# Patient Record
Sex: Female | Born: 1950
Health system: Southern US, Community
[De-identification: ages and names within clinical notes are randomized; demographics above are authoritative.]

## PROBLEM LIST (undated history)

## (undated) ENCOUNTER — Emergency Department: Admission: EM | Discharge status: Absent without leave | Payer: Medicare HMO

## (undated) DIAGNOSIS — G4730 Sleep apnea, unspecified: Secondary | ICD-10-CM

## (undated) DIAGNOSIS — J449 Chronic obstructive pulmonary disease, unspecified: Secondary | ICD-10-CM

## (undated) DIAGNOSIS — Z720 Tobacco use: Secondary | ICD-10-CM

## (undated) DIAGNOSIS — R519 Headache, unspecified: Secondary | ICD-10-CM

## (undated) DIAGNOSIS — I499 Cardiac arrhythmia, unspecified: Secondary | ICD-10-CM

## (undated) DIAGNOSIS — R011 Cardiac murmur, unspecified: Secondary | ICD-10-CM

## (undated) DIAGNOSIS — E785 Hyperlipidemia, unspecified: Secondary | ICD-10-CM

## (undated) DIAGNOSIS — Z800 Family history of malignant neoplasm of digestive organs: Secondary | ICD-10-CM

## (undated) DIAGNOSIS — M810 Age-related osteoporosis without current pathological fracture: Secondary | ICD-10-CM

## (undated) DIAGNOSIS — C801 Malignant (primary) neoplasm, unspecified: Secondary | ICD-10-CM

## (undated) DIAGNOSIS — J439 Emphysema, unspecified: Secondary | ICD-10-CM

## (undated) DIAGNOSIS — R51 Headache: Secondary | ICD-10-CM

## (undated) DIAGNOSIS — M1990 Unspecified osteoarthritis, unspecified site: Secondary | ICD-10-CM

## (undated) HISTORY — DX: Family history of malignant neoplasm of digestive organs: Z80.0

## (undated) HISTORY — PX: ABDOMINAL HYSTERECTOMY: SHX81

## (undated) HISTORY — PX: HEMICOLECTOMY: SHX854

## (undated) HISTORY — DX: Emphysema, unspecified: J43.9

## (undated) HISTORY — DX: Age-related osteoporosis without current pathological fracture: M81.0

## (undated) HISTORY — DX: Chronic obstructive pulmonary disease, unspecified: J44.9

## (undated) HISTORY — DX: Tobacco use: Z72.0

## (undated) HISTORY — PX: TUBAL LIGATION: SHX77

## (undated) HISTORY — DX: Sleep apnea, unspecified: G47.30

---

## 2017-02-19 ENCOUNTER — Telehealth: Payer: Self-pay | Admitting: *Deleted

## 2017-02-19 DIAGNOSIS — Z122 Encounter for screening for malignant neoplasm of respiratory organs: Secondary | ICD-10-CM

## 2017-02-19 DIAGNOSIS — Z87891 Personal history of nicotine dependence: Secondary | ICD-10-CM

## 2017-02-19 NOTE — Telephone Encounter (Signed)
Received self referral for initial lung cancer screening scan. Contacted patient and obtained smoking history,(current, 33.75 pack year) as well as answering questions related to screening process. Patient denies signs of lung cancer such as weight loss or hemoptysis. Patient denies comorbidity that would prevent curative treatment if lung cancer were found. Patient is scheduled for shared decision making visit and CT scan on 03/11/17.

## 2017-03-09 ENCOUNTER — Encounter: Payer: Self-pay | Admitting: Physician Assistant

## 2017-03-09 ENCOUNTER — Ambulatory Visit (INDEPENDENT_AMBULATORY_CARE_PROVIDER_SITE_OTHER): Payer: Medicare Other | Admitting: Physician Assistant

## 2017-03-09 VITALS — BP 132/84 | HR 68 | Temp 98.3°F | Resp 16 | Ht 70.0 in | Wt 159.0 lb

## 2017-03-09 DIAGNOSIS — Z23 Encounter for immunization: Secondary | ICD-10-CM

## 2017-03-09 DIAGNOSIS — Z131 Encounter for screening for diabetes mellitus: Secondary | ICD-10-CM

## 2017-03-09 DIAGNOSIS — Z720 Tobacco use: Secondary | ICD-10-CM

## 2017-03-09 DIAGNOSIS — Z136 Encounter for screening for cardiovascular disorders: Secondary | ICD-10-CM | POA: Diagnosis not present

## 2017-03-09 DIAGNOSIS — F329 Major depressive disorder, single episode, unspecified: Secondary | ICD-10-CM

## 2017-03-09 DIAGNOSIS — Z1211 Encounter for screening for malignant neoplasm of colon: Secondary | ICD-10-CM | POA: Diagnosis not present

## 2017-03-09 DIAGNOSIS — F1010 Alcohol abuse, uncomplicated: Secondary | ICD-10-CM | POA: Diagnosis not present

## 2017-03-09 DIAGNOSIS — Z1239 Encounter for other screening for malignant neoplasm of breast: Secondary | ICD-10-CM

## 2017-03-09 DIAGNOSIS — Z780 Asymptomatic menopausal state: Secondary | ICD-10-CM | POA: Diagnosis not present

## 2017-03-09 DIAGNOSIS — H547 Unspecified visual loss: Secondary | ICD-10-CM | POA: Diagnosis not present

## 2017-03-09 DIAGNOSIS — F32A Depression, unspecified: Secondary | ICD-10-CM

## 2017-03-09 DIAGNOSIS — Z13220 Encounter for screening for lipoid disorders: Secondary | ICD-10-CM | POA: Diagnosis not present

## 2017-03-09 DIAGNOSIS — D229 Melanocytic nevi, unspecified: Secondary | ICD-10-CM

## 2017-03-09 DIAGNOSIS — F17210 Nicotine dependence, cigarettes, uncomplicated: Secondary | ICD-10-CM

## 2017-03-09 DIAGNOSIS — K118 Other diseases of salivary glands: Secondary | ICD-10-CM | POA: Diagnosis not present

## 2017-03-09 MED ORDER — NICOTINE 14 MG/24HR TD PT24: 14.0000 mg | MEDICATED_PATCH | Freq: Every day | TRANSDERMAL | 0 refills | Status: DC

## 2017-03-09 MED ORDER — NICOTINE 7 MG/24HR TD PT24: 7.0000 mg | MEDICATED_PATCH | Freq: Every day | TRANSDERMAL | 0 refills | Status: DC

## 2017-03-09 NOTE — Progress Notes (Signed)
Patient: Jennifer [name] Female    DOB: 1950/07/15   67 y.o.   MRN: [id number] Visit Date: 03/10/2017  Today's Provider: Trinna Post, PA-C   Chief Complaint  Patient presents with  . Establish Care   Subjective:    HPI   Jennifer [name] is a 67 y/o woman presenting today to establish care. She has not seen a provider in 10 years. She used to see Dr. Humphrey Rolls at [facility]. She currently lives in [place] with roommate. She is not working.  She is single. She had one son who was killed 20 years ago in a car accident. No grandchildren.   She is single, not sexually active.   She is smoking 1 pack per day, has done so since age 60-20. She is scheduled for low dose CT lung cancer screening. Would like to try patches to quit. She declines pneumonia and tetanus vaccinations today.  She drinks 1-2 pints of whiskey per week. She says this has been going on three months, it is due to stress. Does not elaborate which stress. She does not wish to stop.   She has a family history of colon cancer in maternal aunt, uncle and grandfather. Has never been screened for colon cancer. She does not remember the last time she had a mammogram. She had a hysterectomy 23 years ago for endometriosis. She does not recall if her cervix remains. She has never had a DEXA scan.   Also requests eye referral for vision loss and dermatology referral for history of precancerous lesions and suspicious nevus.   She also reports that for the past three years she has been having intermittent bilateral swelling in her neck when she eats. She reports this is not consistent and does not persist. She reports she massages her glands and it goes away.      No Known Allergies   Current Outpatient Medications:  .  nicotine (NICODERM CQ) 14 mg/24hr patch, Place 1 patch (14 mg total) onto the skin daily., Disp: 28 patch, Rfl: 0 .  nicotine (NICODERM CQ) 7 mg/24hr patch, Place 1 patch (7 mg total) onto the skin daily., Disp: 14 patch,  Rfl: 0  Review of Systems  Constitutional: Positive for activity change and appetite change. Negative for chills, diaphoresis, fatigue, fever and unexpected weight change.  HENT: Positive for congestion, rhinorrhea, sinus pressure and sneezing. Negative for dental problem, drooling, ear discharge, ear pain, facial swelling, hearing loss, mouth sores, nosebleeds, postnasal drip, sinus pain, sore throat, tinnitus, trouble swallowing and voice change.   Eyes: Positive for discharge. Negative for photophobia, pain, redness, itching and visual disturbance.  Respiratory: Positive for cough. Negative for apnea, choking, chest tightness, shortness of breath, wheezing and stridor.   Cardiovascular: Negative.   Gastrointestinal: Negative.   Endocrine: Negative.   Genitourinary: Negative.   Musculoskeletal: Positive for back pain and myalgias. Negative for arthralgias, gait problem, joint swelling, neck pain and neck stiffness.  Skin: Negative.   Allergic/Immunologic: Positive for environmental allergies. Negative for food allergies and immunocompromised state.  Neurological: Positive for headaches. Negative for dizziness, tremors, seizures, syncope, facial asymmetry, speech difficulty, weakness, light-headedness and numbness.  Hematological: Negative.   Psychiatric/Behavioral: Positive for behavioral problems. Negative for agitation, confusion, decreased concentration, dysphoric mood, hallucinations, self-injury, sleep disturbance and suicidal ideas. The patient is not nervous/anxious and is not hyperactive.     Social History   Tobacco Use  . Smoking status: Current Every Day Smoker    Packs/day: 0.50  Years: 46.00    Pack years: 23.00    Types: Cigarettes  . Smokeless tobacco: Never Used  Substance Use Topics  . Alcohol use: Yes    Alcohol/week: 7.2 oz    Types: 12 Shots of liquor per week   Family History  Problem Relation Age of Onset  . Diabetes Mother   . Kidney disease Mother   .  Heart attack Father   . Diabetes Maternal Grandmother   . Colon cancer Maternal Grandfather   . Heart disease Paternal Grandmother   . Colon cancer Paternal Uncle   . Colon cancer Paternal Aunt    Past Surgical History:  Procedure Laterality Date  . HYSTEROTOMY  1996     Objective:   BP 132/84 (BP Location: Right Arm, Patient Position: Sitting, Cuff Size: Normal)   Pulse 68   Temp 98.3 F (36.8 C) (Oral)   Resp 16   Ht 5\' 10"  (1.778 m)   Wt 159 lb (72.1 kg)   BMI 22.81 kg/m  Vitals:   03/09/17 1422  BP: 132/84  Pulse: 68  Resp: 16  Temp: 98.3 F (36.8 C)  TempSrc: Oral  Weight: 159 lb (72.1 kg)  Height: 5\' 10"  (1.778 m)     Physical Exam  Constitutional: She is oriented to person, place, and time. She appears well-developed and well-nourished.  HENT:  Right Ear: External ear normal.  Left Ear: External ear normal.  Mouth/Throat: Oropharynx is clear and moist. No oropharyngeal exudate.  Eyes: Conjunctivae are normal.  Neck: Neck supple.  Cardiovascular: Normal rate and regular rhythm.  Pulmonary/Chest: Effort normal. She has wheezes.  Abdominal: Soft. Bowel sounds are normal.  Lymphadenopathy:    She has no cervical adenopathy.  Neurological: She is alert and oriented to person, place, and time.  Skin: Skin is warm and dry.  Psychiatric: She has a normal mood and affect. Her behavior is normal.        Assessment & Plan:     1. Vision loss  - Ambulatory referral to Ophthalmology  2. Suspicious nevus  - Ambulatory referral to Dermatology  3. Other diseases of salivary glands  - Ambulatory referral to ENT  4. Tobacco abuse  Counseled 3-10 minutes. Scheduled for lung cancer screening.  - nicotine (NICODERM CQ) 14 mg/24hr patch; Place 1 patch (14 mg total) onto the skin daily.  Dispense: 28 patch; Refill: 0 - nicotine (NICODERM CQ) 7 mg/24hr patch; Place 1 patch (7 mg total) onto the skin daily.  Dispense: 14 patch; Refill: 0  5. Alcohol  abuse  Counseled that 1-2 pints of whiskey constitutes alcohol abuse. She does not want to stop or seek therapy.  6. Colon cancer screening  - Ambulatory referral to Gastroenterology  7. Breast cancer screening  - MM Digital Screening; Future  8. Need for vaccination against Streptococcus pneumoniae  Declined.  9. Post-menopausal  - DG DXA FRACTURE ASSESSMENT; Future  10. Diabetes mellitus screening  - Comprehensive Metabolic Panel (CMET)  11. Screening cholesterol level  - Lipid Profile  12. Depression, unspecified depression type  Does not desire treatment or therapy.  - TSH  Return in about 2 weeks (around 03/23/2017) for PAP/breast exam.  The entirety of the information documented in the History of Present Illness, Review of Systems and Physical Exam were personally obtained by me. Portions of this information were initially documented by Ashley Royalty, CMA and reviewed by me for thoroughness and accuracy.    I have spent 45 minutes with this patient, >  50% of which was spent on counseling and coordination of care.       Trinna Post, PA-C  [facility]

## 2017-03-09 NOTE — Patient Instructions (Signed)

## 2017-03-10 ENCOUNTER — Telehealth: Payer: Self-pay

## 2017-03-10 ENCOUNTER — Other Ambulatory Visit: Payer: Self-pay

## 2017-03-10 ENCOUNTER — Encounter: Payer: Self-pay | Admitting: Oncology

## 2017-03-10 ENCOUNTER — Encounter: Payer: Self-pay | Admitting: Physician Assistant

## 2017-03-10 DIAGNOSIS — F17200 Nicotine dependence, unspecified, uncomplicated: Secondary | ICD-10-CM | POA: Insufficient documentation

## 2017-03-10 DIAGNOSIS — Z1211 Encounter for screening for malignant neoplasm of colon: Secondary | ICD-10-CM

## 2017-03-10 DIAGNOSIS — Z720 Tobacco use: Secondary | ICD-10-CM | POA: Insufficient documentation

## 2017-03-10 LAB — COMPREHENSIVE METABOLIC PANEL
ALT: 18 IU/L (ref 0–32)
AST: 22 IU/L (ref 0–40)
Albumin/Globulin Ratio: 1.9 (ref 1.2–2.2)
Albumin: 4.7 g/dL (ref 3.6–4.8)
Alkaline Phosphatase: 97 IU/L (ref 39–117)
BUN/Creatinine Ratio: 9 — ABNORMAL LOW (ref 12–28)
BUN: 8 mg/dL (ref 8–27)
Bilirubin Total: 0.4 mg/dL (ref 0.0–1.2)
CO2: 21 mmol/L (ref 20–29)
Calcium: 9.8 mg/dL (ref 8.7–10.3)
Chloride: 102 mmol/L (ref 96–106)
Creatinine, Ser: 0.85 mg/dL (ref 0.57–1.00)
GFR calc Af Amer: 83 mL/min/{1.73_m2} (ref >59)
GFR calc non Af Amer: 72 mL/min/{1.73_m2} (ref >59)
Globulin, Total: 2.5 g/dL (ref 1.5–4.5)
Glucose: 78 mg/dL (ref 65–99)
Potassium: 4.3 mmol/L (ref 3.5–5.2)
Sodium: 140 mmol/L (ref 134–144)
Total Protein: 7.2 g/dL (ref 6.0–8.5)

## 2017-03-10 LAB — LIPID PANEL
Chol/HDL Ratio: 3.6 ratio (ref 0.0–4.4)
Cholesterol, Total: 298 mg/dL — ABNORMAL HIGH (ref 100–199)
HDL: 82 mg/dL (ref >39)
LDL Calculated: 184 mg/dL — ABNORMAL HIGH (ref 0–99)
Triglycerides: 158 mg/dL — ABNORMAL HIGH (ref 0–149)
VLDL Cholesterol Cal: 32 mg/dL (ref 5–40)

## 2017-03-10 LAB — TSH: TSH: 2.28 u[IU]/mL (ref 0.450–4.500)

## 2017-03-10 NOTE — Telephone Encounter (Signed)
Gastroenterology Pre-Procedure Review  Request Date: 03/29/17 Requesting Physician: Dr. Marius Ditch  PATIENT REVIEW QUESTIONS: The patient responded to the following health history questions as indicated:    1. Are you having any GI issues? no 2. Do you have a personal history of Polyps? no 3. Do you have a family history of Colon Cancer or Polyps? yes (colon cancer grandma and aunt) 4. Diabetes Mellitus? no 5. Joint replacements in the past 12 months?no 6. Major health problems in the past 3 months?no 7. Any artificial heart valves, MVP, or defibrillator?no    MEDICATIONS & ALLERGIES:    Patient reports the following regarding taking any anticoagulation/antiplatelet therapy:   Plavix, Coumadin, Eliquis, Xarelto, Lovenox, Pradaxa, Brilinta, or Effient? no Aspirin? no  Patient confirms/reports the following medications:  Current Outpatient Medications  Medication Sig Dispense Refill  . nicotine (NICODERM CQ) 14 mg/24hr patch Place 1 patch (14 mg total) onto the skin daily. 28 patch 0  . nicotine (NICODERM CQ) 7 mg/24hr patch Place 1 patch (7 mg total) onto the skin daily. 14 patch 0   No current facility-administered medications for this visit.     Patient confirms/reports the following allergies:  No Known Allergies  No orders of the defined types were placed in this encounter.   AUTHORIZATION INFORMATION Primary Insurance: 1D#: Group #:  Secondary Insurance: 1D#: Group #:  SCHEDULE INFORMATION: Date: 03/29/17 Time: Location:[facility]

## 2017-03-11 ENCOUNTER — Ambulatory Visit
Admission: RE | Admit: 2017-03-11 | Discharge: 2017-03-11 | Disposition: A | Discharge status: Home / Self Care | Payer: Medicare Other | Source: Ambulatory Visit | Attending: Oncology | Admitting: Oncology

## 2017-03-11 ENCOUNTER — Encounter: Payer: Self-pay | Admitting: Physician Assistant

## 2017-03-11 ENCOUNTER — Inpatient Hospital Stay: Payer: Medicaid Other | Attending: Oncology | Admitting: Oncology

## 2017-03-11 DIAGNOSIS — Z87891 Personal history of nicotine dependence: Secondary | ICD-10-CM

## 2017-03-11 DIAGNOSIS — I700 Atherosclerosis of aorta: Secondary | ICD-10-CM | POA: Diagnosis not present

## 2017-03-11 DIAGNOSIS — J432 Centrilobular emphysema: Secondary | ICD-10-CM | POA: Insufficient documentation

## 2017-03-11 DIAGNOSIS — Z122 Encounter for screening for malignant neoplasm of respiratory organs: Secondary | ICD-10-CM | POA: Diagnosis not present

## 2017-03-11 DIAGNOSIS — F17210 Nicotine dependence, cigarettes, uncomplicated: Secondary | ICD-10-CM | POA: Diagnosis not present

## 2017-03-11 NOTE — Progress Notes (Signed)
In accordance with CMS guidelines, patient has met eligibility criteria including age, absence of signs or symptoms of lung cancer.  Social History   Tobacco Use  . Smoking status: Current Every Day Smoker    Packs/day: 0.75    Years: 45.00    Pack years: 33.75    Types: Cigarettes  . Smokeless tobacco: Never Used  Substance Use Topics  . Alcohol use: Yes    Alcohol/week: 7.2 oz    Types: 12 Shots of liquor per week  . Drug use: No     A shared decision-making session was conducted prior to the performance of CT scan. This includes one or more decision aids, includes benefits and harms of screening, follow-up diagnostic testing, over-diagnosis, false positive rate, and total radiation exposure.  Counseling on the importance of adherence to annual lung cancer LDCT screening, impact of co-morbidities, and ability or willingness to undergo diagnosis and treatment is imperative for compliance of the program.  Counseling on the importance of continued smoking cessation for former smokers; the importance of smoking cessation for current smokers, and information about tobacco cessation interventions have been given to patient including [place] and 1800 quit [place] programs.  Written order for lung cancer screening with LDCT has been given to the patient and any and all questions have been answered to the best of my abilities.   Yearly follow up will be coordinated by Burgess Estelle, Thoracic Navigator.  Faythe Casa, NP 03/11/2017 3:23 PM

## 2017-03-12 ENCOUNTER — Encounter: Payer: Self-pay | Admitting: *Deleted

## 2017-03-15 DIAGNOSIS — C44712 Basal cell carcinoma of skin of right lower limb, including hip: Secondary | ICD-10-CM | POA: Diagnosis not present

## 2017-03-15 DIAGNOSIS — L570 Actinic keratosis: Secondary | ICD-10-CM | POA: Diagnosis not present

## 2017-03-16 ENCOUNTER — Other Ambulatory Visit: Payer: Self-pay | Admitting: Physician Assistant

## 2017-03-16 DIAGNOSIS — Z780 Asymptomatic menopausal state: Secondary | ICD-10-CM

## 2017-03-16 DIAGNOSIS — Z13820 Encounter for screening for osteoporosis: Secondary | ICD-10-CM

## 2017-03-25 ENCOUNTER — Encounter: Payer: Self-pay | Admitting: Physician Assistant

## 2017-03-25 ENCOUNTER — Ambulatory Visit (INDEPENDENT_AMBULATORY_CARE_PROVIDER_SITE_OTHER): Payer: Medicare Other | Admitting: Physician Assistant

## 2017-03-25 VITALS — BP 136/74 | HR 64 | Temp 98.4°F | Resp 16 | Wt 157.0 lb

## 2017-03-25 DIAGNOSIS — Z23 Encounter for immunization: Secondary | ICD-10-CM

## 2017-03-25 DIAGNOSIS — Z720 Tobacco use: Secondary | ICD-10-CM | POA: Diagnosis not present

## 2017-03-25 DIAGNOSIS — E7800 Pure hypercholesterolemia, unspecified: Secondary | ICD-10-CM | POA: Diagnosis not present

## 2017-03-25 DIAGNOSIS — Z124 Encounter for screening for malignant neoplasm of cervix: Secondary | ICD-10-CM | POA: Diagnosis not present

## 2017-03-25 DIAGNOSIS — J439 Emphysema, unspecified: Secondary | ICD-10-CM | POA: Diagnosis not present

## 2017-03-25 DIAGNOSIS — Z1231 Encounter for screening mammogram for malignant neoplasm of breast: Secondary | ICD-10-CM | POA: Diagnosis not present

## 2017-03-25 DIAGNOSIS — Z1239 Encounter for other screening for malignant neoplasm of breast: Secondary | ICD-10-CM

## 2017-03-25 DIAGNOSIS — F17210 Nicotine dependence, cigarettes, uncomplicated: Secondary | ICD-10-CM | POA: Diagnosis not present

## 2017-03-25 DIAGNOSIS — I700 Atherosclerosis of aorta: Secondary | ICD-10-CM

## 2017-03-25 MED ORDER — ATORVASTATIN CALCIUM 10 MG PO TABS: 10.0000 mg | ORAL_TABLET | Freq: Every day | ORAL | 0 refills | Status: DC

## 2017-03-25 NOTE — Patient Instructions (Signed)

## 2017-03-25 NOTE — Progress Notes (Signed)
Patient: Jennifer [name] Female    DOB: [birth year]   67 y.o.   MRN: [id number] Visit Date: 03/26/2017  Today's Provider: Trinna Post, PA-C   Chief Complaint  Patient presents with  . Gynecologic Exam   Subjective:    HPI Jennifer [name] is a 67 y/o woman presenting today for maintenance care. She has declined an AWV. She has a DEXA scan scheduled as well as an upcoming colonoscopy. She has a mammogram ordered but has not scheduled this.   She had a hysterectomy remotely for endometriosis but is not sure whether or not she has a cervix.   She is currently smoking, has not started using nicotine patches.   Her cholesterol was elevated and CVD risk >7.5% indicating statin therapy. She would like to try this.   She had a low dose CT scan which showed some benign appearing pulmonary nodules, emphysematous changes, aortic atherosclerosis.      No Known Allergies   Current Outpatient Medications:  .  atorvastatin (LIPITOR) 10 MG tablet, Take 1 tablet (10 mg total) by mouth daily., Disp: 90 tablet, Rfl: 0 .  nicotine (NICODERM CQ) 14 mg/24hr patch, Place 1 patch (14 mg total) onto the skin daily., Disp: 28 patch, Rfl: 0 .  nicotine (NICODERM CQ) 7 mg/24hr patch, Place 1 patch (7 mg total) onto the skin daily., Disp: 14 patch, Rfl: 0  Review of Systems  12 point ROS reviewed and was negative except for listed in HPI.  Social History   Tobacco Use  . Smoking status: Current Every Day Smoker    Packs/day: 0.75    Years: 45.00    Pack years: 33.75    Types: Cigarettes  . Smokeless tobacco: Never Used  Substance Use Topics  . Alcohol use: Yes    Alcohol/week: 7.2 oz    Types: 12 Shots of liquor per week   Objective:   BP 136/74 (BP Location: Left Arm, Patient Position: Sitting, Cuff Size: Normal)   Pulse 64   Temp 98.4 F (36.9 C) (Oral)   Resp 16   Wt 157 lb (71.2 kg)   BMI 22.53 kg/m  Vitals:   03/25/17 1413  BP: 136/74  Pulse: 64  Resp: 16  Temp: 98.4 F (36.9  C)  TempSrc: Oral  Weight: 157 lb (71.2 kg)     Physical Exam      Assessment & Plan:     1. Pulmonary emphysema, unspecified emphysema type ([place])  Changes evident on CT scan. Patient long time smoker, continues to smoke. Has been encouraged to quit smoking. She has not yet used nicotine patches.  2. Need for pneumococcal vaccination  - Pneumococcal conjugate vaccine 13-valent  3. Aortic atherosclerosis ([facility])  Evident on low dose CT scan and cholesterol elevated. Starting Lipitor. Has also been advised smoking adversely affects atherosclerosis.   4. Hypercholesterolemia  - atorvastatin (LIPITOR) 10 MG tablet; Take 1 tablet (10 mg total) by mouth daily.  Dispense: 90 tablet; Refill: 0  5. Breast cancer screening  Needs to schedule.   6. Cervical cancer screening  She does NOT have cervix on exam today, had hysterectomy for endometriosis and had no cancerous findings. She does not need a vaginal smear today.  7. Tobacco abuse  Counseled >48min on tobacco cessation.  Return in about 6 weeks (around 05/06/2017) for Hyperlipidemia, Lipitor.  The entirety of the information documented in the History of Present Illness, Review of Systems and Physical Exam were personally obtained  by me. Portions of this information were initially documented by Ashley Royalty, CMA and reviewed by me for thoroughness and accuracy.          Trinna Post, PA-C  [facility]

## 2017-03-26 ENCOUNTER — Encounter: Payer: Self-pay | Admitting: Emergency Medicine

## 2017-03-29 ENCOUNTER — Ambulatory Visit: Payer: Medicare Other | Admitting: Certified Registered"

## 2017-03-29 ENCOUNTER — Encounter: Payer: Self-pay | Admitting: Anesthesiology

## 2017-03-29 ENCOUNTER — Encounter
Admission: RE | Disposition: A | Discharge status: Home / Self Care | Payer: Self-pay | Source: Ambulatory Visit | Attending: Gastroenterology

## 2017-03-29 ENCOUNTER — Ambulatory Visit
Admission: RE | Admit: 2017-03-29 | Discharge: 2017-03-29 | Disposition: A | Discharge status: Home / Self Care | Payer: Medicare Other | Source: Ambulatory Visit | Attending: Gastroenterology | Admitting: Gastroenterology

## 2017-03-29 DIAGNOSIS — Z800 Family history of malignant neoplasm of digestive organs: Secondary | ICD-10-CM | POA: Diagnosis not present

## 2017-03-29 DIAGNOSIS — K635 Polyp of colon: Secondary | ICD-10-CM | POA: Diagnosis not present

## 2017-03-29 DIAGNOSIS — F17210 Nicotine dependence, cigarettes, uncomplicated: Secondary | ICD-10-CM | POA: Diagnosis not present

## 2017-03-29 DIAGNOSIS — D120 Benign neoplasm of cecum: Secondary | ICD-10-CM | POA: Diagnosis not present

## 2017-03-29 DIAGNOSIS — D124 Benign neoplasm of descending colon: Secondary | ICD-10-CM | POA: Insufficient documentation

## 2017-03-29 DIAGNOSIS — D122 Benign neoplasm of ascending colon: Secondary | ICD-10-CM | POA: Diagnosis not present

## 2017-03-29 DIAGNOSIS — Z1211 Encounter for screening for malignant neoplasm of colon: Secondary | ICD-10-CM

## 2017-03-29 DIAGNOSIS — Z79899 Other long term (current) drug therapy: Secondary | ICD-10-CM | POA: Insufficient documentation

## 2017-03-29 DIAGNOSIS — D123 Benign neoplasm of transverse colon: Secondary | ICD-10-CM | POA: Insufficient documentation

## 2017-03-29 DIAGNOSIS — D125 Benign neoplasm of sigmoid colon: Secondary | ICD-10-CM | POA: Diagnosis not present

## 2017-03-29 HISTORY — PX: COLONOSCOPY WITH PROPOFOL: SHX5780

## 2017-03-29 SURGERY — COLONOSCOPY WITH PROPOFOL
Anesthesia: General

## 2017-03-29 MED ORDER — SPOT INK MARKER SYRINGE KIT
PACK | SUBMUCOSAL | Status: DC | PRN
  Administered 2017-03-29: 1.5 mL via SUBMUCOSAL

## 2017-03-29 MED ORDER — PROPOFOL 500 MG/50ML IV EMUL
INTRAVENOUS | Status: AC
  Filled 2017-03-29: qty 50

## 2017-03-29 MED ORDER — PROPOFOL 10 MG/ML IV BOLUS
INTRAVENOUS | Status: DC | PRN
  Administered 2017-03-29: 30 mg via INTRAVENOUS
  Administered 2017-03-29: 20 mg via INTRAVENOUS
  Administered 2017-03-29: 50 mg via INTRAVENOUS

## 2017-03-29 MED ORDER — PROPOFOL 500 MG/50ML IV EMUL
INTRAVENOUS | Status: DC | PRN
  Administered 2017-03-29: 140 ug/kg/min via INTRAVENOUS

## 2017-03-29 MED ORDER — PHENYLEPHRINE HCL 10 MG/ML IJ SOLN
INTRAMUSCULAR | Status: AC
  Filled 2017-03-29: qty 1

## 2017-03-29 MED ORDER — LIDOCAINE HCL (CARDIAC) 20 MG/ML IV SOLN
INTRAVENOUS | Status: DC | PRN
  Administered 2017-03-29: 50 mg via INTRAVENOUS

## 2017-03-29 MED ORDER — EPINEPHRINE PF 1 MG/10ML IJ SOSY
PREFILLED_SYRINGE | INTRAMUSCULAR | Status: DC | PRN
  Administered 2017-03-29: 1 mg via SUBCUTANEOUS

## 2017-03-29 MED ORDER — SODIUM CHLORIDE 0.9 % IV SOLN
INTRAVENOUS | Status: DC
  Administered 2017-03-29: 1000 mL via INTRAVENOUS

## 2017-03-29 MED ORDER — LIDOCAINE HCL (PF) 2 % IJ SOLN
INTRAMUSCULAR | Status: AC
  Filled 2017-03-29: qty 10

## 2017-03-29 NOTE — Op Note (Signed)
[facility] Gastroenterology Patient Name: Jennifer [name] Procedure Date: 03/29/2017 9:12 AM MRN: [id number] Account #: [id number] Date of Birth: Feb 29, 1950 Admit Type: Outpatient Age: 67 Room: [facility] ENDO ROOM 2 Gender: Female Note Status: Finalized Procedure:            Colonoscopy Indications:          Colon cancer screening in patient at increased risk:                        Family history of colorectal cancer in multiple 2nd                        degree relatives, This is the patient's first                        colonoscopy Providers:            Lin Landsman MD, MD Medicines:            Monitored Anesthesia Care Complications:        No immediate complications. Estimated blood loss:                        Minimal. Procedure:            Pre-Anesthesia Assessment:                       - Prior to the procedure, a History and Physical was                        performed, and patient medications and allergies were                        reviewed. The patient is competent. The risks and                        benefits of the procedure and the sedation options and                        risks were discussed with the patient. All questions                        were answered and informed consent was obtained.                        Patient identification and proposed procedure were                        verified by the physician, the nurse, the                        anesthesiologist, the anesthetist and the technician in                        the pre-procedure area in the procedure room in the                        endoscopy suite. Mental Status Examination: alert and                        oriented. Airway  Examination: normal oropharyngeal                        airway and neck mobility. Respiratory Examination:                        clear to auscultation. CV Examination: normal.                        Prophylactic Antibiotics: The patient does not  require                        prophylactic antibiotics. Prior Anticoagulants: The                        patient has taken no previous anticoagulant or                        antiplatelet agents. ASA Grade Assessment: II - A                        patient with mild systemic disease. After reviewing the                        risks and benefits, the patient was deemed in                        satisfactory condition to undergo the procedure. The                        anesthesia plan was to use monitored anesthesia care                        (MAC). Immediately prior to administration of                        medications, the patient was re-assessed for adequacy                        to receive sedatives. The heart rate, respiratory rate,                        oxygen saturations, blood pressure, adequacy of                        pulmonary ventilation, and response to care were                        monitored throughout the procedure. The physical status                        of the patient was re-assessed after the procedure.                       After obtaining informed consent, the colonoscope was                        passed under direct vision. Throughout the procedure,                        the patient's blood pressure,  pulse, and oxygen                        saturations were monitored continuously. The                        Colonoscope was introduced through the anus and                        advanced to the the cecum, identified by appendiceal                        orifice and ileocecal valve. The colonoscopy was                        performed without difficulty. The patient tolerated the                        procedure fairly well. The quality of the bowel                        preparation was evaluated using the BBPS [facility] Bowel                        Preparation Scale) with scores of: Right Colon = 3,                        Transverse Colon = 3 and Left Colon =  3 (entire mucosa                        seen well with no residual staining, small fragments of                        stool or opaque liquid). The total BBPS score equals 9. Findings:      The perianal and digital rectal examinations were normal. Pertinent       negatives include normal sphincter tone and no palpable rectal lesions.      A 7 mm polyp was found in the cecum. The polyp was sessile. The polyp       was removed with a hot snare. Resection and retrieval were complete.      A 10 mm polyp was found in the cecum. The polyp was carpet-like and       flat. Preparations were made for mucosal resection. Methylene blue was       injected to raise the lesion. Snare mucosal resection was performed.       Resection and retrieval were complete. To prevent bleeding after mucosal       resection, one hemostatic clip was successfully placed. There was no       bleeding at the end of the procedure.      A 10 mm polyp was found in the proximal ascending colon. The polyp was       carpet-like and flat. Preparations were made for mucosal resection.       Methylene blue was injected to raise the lesion. Snare mucosal resection       was performed. Resection was complete, and retrieval was complete.      Two sessile polyps were found in the ascending colon. The polyps were 5  mm in size. These polyps were removed with a cold snare. Resection and       retrieval were complete.      A 12 mm polyp was found in the mid ascending colon. The polyp was       carpet-like and flat. Preparations were made for mucosal resection.       Methylene blue was injected to raise the lesion. Snare mucosal resection       was performed. Resection was incomplete. The resected tissue was       retrieved. To prevent bleeding after mucosal resection, one hemostatic       clip was successfully placed. There was no bleeding at the end of the       procedure.      A 25 mm polyp was found in the ascending colon. The  polyp was       carpet-like and flat. Polypectomy was not attempted due to polyp size       (too large to be excised). Area was tattooed with an injection of [place]       ink.      A 8 mm polyp was found in the transverse colon. The polyp was sessile.       The polyp was removed with a hot snare. Resection and retrieval were       complete.      A 10 mm polyp was found in the descending colon. The polyp was flat.       Preparations were made for mucosal resection. Methylene blue was       injected to raise the lesion. Snare mucosal resection was performed.       Resection and retrieval were complete. To prevent bleeding after mucosal       resection, one hemostatic clip was successfully placed. There was no       bleeding during, or at the end, of the procedure.      A 8 mm polyp was found in the sigmoid colon. The polyp was sessile. The       polyp was removed with a hot snare. Resection and retrieval were       complete.      The retroflexed view of the distal rectum and anal verge was normal and       showed no anal or rectal abnormalities. Impression:           - One 7 mm polyp in the cecum, removed with a hot                        snare. Resected and retrieved.                       - One 10 mm polyp in the cecum, removed with mucosal                        resection. Resected and retrieved. Clip was placed.                       - One 10 mm polyp in the proximal ascending colon,                        removed with mucosal resection. Resected and retrieved.                       -  Two 5 mm polyps in the ascending colon, removed with                        a cold snare. Resected and retrieved.                       - One 12 mm polyp in the mid ascending colon, removed                        with mucosal resection. Incomplete resection. Resected                        tissue retrieved. Clip was placed.                       - One 25 mm polyp in the ascending colon. Resection not                         attempted. Tattooed.                       - One 8 mm polyp in the transverse colon, removed with                        a hot snare. Resected and retrieved.                       - One 10 mm polyp in the descending colon, removed with                        mucosal resection. Resected and retrieved. Clip was                        placed.                       - One 8 mm polyp in the sigmoid colon, removed with a                        hot snare. Resected and retrieved.                       - The distal rectum and anal verge are normal on                        retroflexion view.                       - Mucosal resection was performed. Resection and                        retrieval were complete.                       - Mucosal resection was performed. Resection was                        complete, and retrieval was complete.                       - Mucosal resection was performed. Resection was  incomplete. The resected tissue was retrieved.                       - Mucosal resection was performed. Resection and                        retrieval were complete. Recommendation:       - Discharge patient to home.                       - Resume previous diet today.                       - Continue present medications.                       - Await pathology results.                       - Repeat colonoscopy at appointment to be scheduled for                        retreatment.                       - Return to my office in 1 week. Procedure Code(s):    --- Professional ---                       [id number], 78, Colonoscopy, flexible; with endoscopic                        mucosal resection                       [phone], Colonoscopy, flexible; with removal of tumor(s),                        polyp(s), or other lesion(s) by snare technique                       [id number], 85, Colonoscopy, flexible; with directed                        submucosal injection(s),  any substance Diagnosis Code(s):    --- Professional ---                       Z80.0, Family history of malignant neoplasm of                        digestive organs                       D12.0, Benign neoplasm of cecum                       D12.2, Benign neoplasm of ascending colon                       D12.3, Benign neoplasm of transverse colon (hepatic                        flexure or splenic flexure)  D12.4, Benign neoplasm of descending colon                       D12.5, Benign neoplasm of sigmoid colon CPT copyright 2016 American Medical Association. All rights reserved. The codes documented in this report are preliminary and upon coder review may  be revised to meet current compliance requirements. Dr. Ulyess Mort Lin Landsman MD, MD 03/29/2017 10:51:16 AM This report has been signed electronically. Number of Addenda: 0 Note Initiated On: 03/29/2017 9:12 AM Scope Withdrawal Time: 1 hour 2 minutes 54 seconds  Total Procedure Duration: 1 hour 8 minutes 59 seconds       [facility]

## 2017-03-29 NOTE — Anesthesia Procedure Notes (Signed)
Performed by: Law Corsino, CRNA Pre-anesthesia Checklist: Patient identified, Emergency Drugs available, Suction available, Patient being monitored and Timeout performed Patient Re-evaluated:Patient Re-evaluated prior to induction Oxygen Delivery Method: Nasal cannula Induction Type: IV induction       

## 2017-03-29 NOTE — Anesthesia Postprocedure Evaluation (Signed)
Anesthesia Post Note  Patient: Jennifer [name]  Procedure(s) Performed: COLONOSCOPY WITH PROPOFOL (N/A )  Patient location during evaluation: Endoscopy Anesthesia Type: General Level of consciousness: awake and alert Pain management: pain level controlled Vital Signs Assessment: post-procedure vital signs reviewed and stable Respiratory status: spontaneous breathing, nonlabored ventilation, respiratory function stable and patient connected to nasal cannula oxygen Cardiovascular status: blood pressure returned to baseline and stable Postop Assessment: no apparent nausea or vomiting Anesthetic complications: no     Last Vitals:  Vitals:   03/29/17 1057 03/29/17 1107  BP: (!) 146/76 140/81  Pulse: 61 64  Resp: 20 18  Temp:    SpO2: 99% 100%    Last Pain:  Vitals:   03/29/17 1037  TempSrc: Axillary  PainSc:                  [name]

## 2017-03-29 NOTE — Transfer of Care (Signed)
Immediate Anesthesia Transfer of Care Note  Patient: Jennifer [name]  Procedure(s) Performed: COLONOSCOPY WITH PROPOFOL (N/A )  Patient Location: PACU  Anesthesia Type:General  Level of Consciousness: sedated  Airway & Oxygen Therapy: Patient Spontanous Breathing and Patient connected to nasal cannula oxygen  Post-op Assessment: Report given to RN and Post -op Vital signs reviewed and stable  Post vital signs: Reviewed and stable  Last Vitals:  Vitals:   03/29/17 0755 03/29/17 1038  BP: 134/73 (!) 110/55  Pulse: 65 60  Resp: 16 18  Temp: (!) 35.9 C   SpO2: 100% 98%    Last Pain:  Vitals:   03/29/17 0755  TempSrc: Tympanic  PainSc: 2          Complications: No apparent anesthesia complications

## 2017-03-29 NOTE — Anesthesia Preprocedure Evaluation (Signed)
Anesthesia Evaluation  Patient identified by MRN, date of birth, ID band Patient awake    Reviewed: Allergy & Precautions, H&P , NPO status , Patient's Chart, lab work & pertinent test results, reviewed documented beta blocker date and time   History of Anesthesia Complications Negative for: history of anesthetic complications  Airway Mallampati: I  TM Distance: >3 FB Neck ROM: full    Dental  (+) Partial Upper, Dental Advidsory Given, Missing, Poor Dentition   Pulmonary neg shortness of breath, neg sleep apnea, COPD, neg recent URI, Current Smoker,           Cardiovascular Exercise Tolerance: Good (-) hypertension(-) angina(-) CAD, (-) Past MI, (-) Cardiac Stents and (-) CABG + dysrhythmias (palpitations) (-) Valvular Problems/Murmurs     Neuro/Psych negative neurological ROS  negative psych ROS   GI/Hepatic negative GI ROS, Neg liver ROS,   Endo/Other  negative endocrine ROS  Renal/GU negative Renal ROS  negative genitourinary   Musculoskeletal   Abdominal   Peds  Hematology negative hematology ROS (+)   Anesthesia Other Findings History reviewed. No pertinent past medical history.   Reproductive/Obstetrics negative OB ROS                             Anesthesia Physical Anesthesia Plan  ASA: II  Anesthesia Plan: General   Post-op Pain Management:    Induction: Intravenous  PONV Risk Score and Plan: 2 and Propofol infusion  Airway Management Planned: Nasal Cannula  Additional Equipment:   Intra-op Plan:   Post-operative Plan:   Informed Consent: I have reviewed the patients History and Physical, chart, labs and discussed the procedure including the risks, benefits and alternatives for the proposed anesthesia with the patient or authorized representative who has indicated his/her understanding and acceptance.   Dental Advisory Given  Plan Discussed with: Anesthesiologist,  CRNA and Surgeon  Anesthesia Plan Comments:         Anesthesia Quick Evaluation

## 2017-03-29 NOTE — Anesthesia Post-op Follow-up Note (Signed)
Anesthesia QCDR form completed.        

## 2017-03-29 NOTE — H&P (Signed)
Cephas Darby, MD [address]  [place]  [place], [place] [address]  Main: [phone]  Fax: [phone] Pager: [phone]  Primary Care Physician:  Trinna Post, PA-C Primary Gastroenterologist:  Dr. Cephas Darby  Pre-Procedure History & Physical: HPI:  Jennifer [name] is a 67 y.o. female is here for an colonoscopy.   History reviewed. No pertinent past medical history.  Past Surgical History:  Procedure Laterality Date  . HYSTEROTOMY  1996    Prior to Admission medications   Medication Sig Start Date End Date Taking? Authorizing Provider  atorvastatin (LIPITOR) 10 MG tablet Take 1 tablet (10 mg total) by mouth daily. 03/25/17  Yes Trinna Post, PA-C  nicotine (NICODERM CQ) 14 mg/24hr patch Place 1 patch (14 mg total) onto the skin daily. 03/09/17  Yes Trinna Post, PA-C  nicotine (NICODERM CQ) 7 mg/24hr patch Place 1 patch (7 mg total) onto the skin daily. 03/09/17  Yes Trinna Post, PA-C    Allergies as of 03/11/2017  . (No Known Allergies)    Family History  Problem Relation Age of Onset  . Diabetes Mother   . Kidney disease Mother   . Heart attack Father   . Diabetes Maternal Grandmother   . Colon cancer Maternal Grandfather   . Heart disease Paternal Grandmother   . Colon cancer Paternal Uncle   . Colon cancer Paternal Aunt     Social History   Socioeconomic History  . Marital status: Single    Spouse name: Not on file  . Number of children: Not on file  . Years of education: Not on file  . Highest education level: Not on file  Social Needs  . Financial resource strain: Not on file  . Food insecurity - worry: Not on file  . Food insecurity - inability: Not on file  . Transportation needs - medical: Not on file  . Transportation needs - non-medical: Not on file  Occupational History  . Not on file  Tobacco Use  . Smoking status: Current Every Day Smoker    Packs/day: 0.75    Years: 45.00    Pack years: 33.75    Types:  Cigarettes  . Smokeless tobacco: Never Used  Substance and Sexual Activity  . Alcohol use: Yes    Alcohol/week: 7.2 oz    Types: 12 Shots of liquor per week  . Drug use: No  . Sexual activity: Not on file  Other Topics Concern  . Not on file  Social History Narrative  . Not on file    Review of Systems: See HPI, otherwise negative ROS  Physical Exam: BP 134/73   Pulse 65   Temp (!) 96.7 F (35.9 C) (Tympanic)   Resp 16   Ht 5\' 10"  (1.778 m)   Wt 157 lb (71.2 kg)   SpO2 100%   BMI 22.53 kg/m  General:   Alert,  pleasant and cooperative in NAD Head:  Normocephalic and atraumatic. Neck:  Supple; no masses or thyromegaly. Lungs:  Clear throughout to auscultation.    Heart:  Regular rate and rhythm. Abdomen:  Soft, nontender and nondistended. Normal bowel sounds, without guarding, and without rebound.   Neurologic:  Alert and  oriented x4;  grossly normal neurologically.  Impression/Plan: Jennifer [name] is here for an colonoscopy to be performed for colon cancer screening  Risks, benefits, limitations, and alternatives regarding  colonoscopy have been reviewed with the patient.  Questions have been answered.  All parties agreeable.  Sherri Sear, MD  03/29/2017, 9:10 AM

## 2017-03-30 ENCOUNTER — Encounter: Payer: Self-pay | Admitting: Gastroenterology

## 2017-03-30 ENCOUNTER — Telehealth: Payer: Self-pay | Admitting: Gastroenterology

## 2017-03-30 LAB — SURGICAL PATHOLOGY

## 2017-03-30 NOTE — Telephone Encounter (Signed)
Pt left vm to set up another colonoscopy per Dr. Marius Ditch pt needs extra one done she has chosen [facility] in [place] to do it  Please schedule asap per pt and call her back

## 2017-03-30 NOTE — Telephone Encounter (Signed)
Per Dr. Verlin Grills Procedure note she needs a 1 week follow up with her. Procedure requested can be discussed during this time as Dr. Marius Ditch has not discussed the need for a repeat with me. Thanks [employer]

## 2017-04-05 ENCOUNTER — Ambulatory Visit (INDEPENDENT_AMBULATORY_CARE_PROVIDER_SITE_OTHER): Payer: Medicare Other | Admitting: Gastroenterology

## 2017-04-05 ENCOUNTER — Encounter: Payer: Self-pay | Admitting: Gastroenterology

## 2017-04-05 VITALS — BP 158/71 | HR 69 | Temp 99.1°F | Ht 70.0 in | Wt 154.2 lb

## 2017-04-05 DIAGNOSIS — D122 Benign neoplasm of ascending colon: Secondary | ICD-10-CM | POA: Diagnosis not present

## 2017-04-05 DIAGNOSIS — Z86010 Personal history of colonic polyps: Secondary | ICD-10-CM

## 2017-04-05 NOTE — Progress Notes (Signed)
Opened in error

## 2017-04-05 NOTE — Progress Notes (Signed)
Cephas Darby, MD [address]  [place], [place] [address]  Main: [phone]  Fax: [phone] Pager: [phone]   Primary Care Physician: Trinna Post, PA-C  Primary Gastroenterologist:  Dr. Cephas Darby  Chief Complaint  Patient presents with  . Colon Polyps    HPI: Genevia Bouldin is a 67 y.o. female Chronic tobacco use,was initially seen by me on 03/29/2017 for screening colonoscopy. She did not have colonoscopy before. She underwent complete colonoscopy, and found to have multiple polyps. Pathology came back as adenomas with no evidence of high-grade dysplasia or cancer.there were 2 polyps in the ascending colon, one 12 mm flat polyp which was incompletely removed after lifting. Another 25 mm flat polyp in ascending colon in close proximity to the 12 mm polyp was found, resection not attempted, area distal to the polyp was tattooed. She is here to discuss about the pathology results from recent colonoscopy. She is otherwise doing well. Denies any GI complaints today.    Current Outpatient Medications  Medication Sig Dispense Refill  . atorvastatin (LIPITOR) 10 MG tablet Take 1 tablet (10 mg total) by mouth daily. 90 tablet 0  . nicotine (NICODERM CQ) 14 mg/24hr patch Place 1 patch (14 mg total) onto the skin daily. 28 patch 0  . nicotine (NICODERM CQ) 7 mg/24hr patch Place 1 patch (7 mg total) onto the skin daily. 14 patch 0   No current facility-administered medications for this visit.     Allergies as of 04/05/2017  . (No Known Allergies)    NSAIDs: none  Antiplts/Anticoagulants/Anti thrombotics: none  GI procedures:  Colonoscopy 03/29/2017 - One 7 mm polyp in the cecum, removed with a hot snare. Resected and retrieved. - One 10 mm polyp in the cecum, removed with mucosal resection. Resected and retrieved. Clip was placed. - One 10 mm polyp in the proximal ascending colon, removed with mucosal resection. Resected and retrieved. - Two 5 mm  polyps in the ascending colon, removed with a cold snare. Resected and retrieved. - One 12 mm polyp in the mid ascending colon, removed with mucosal resection. Incomplete resection. Resected tissue retrieved. Clip was placed. - One 25 mm polyp in the ascending colon. Resection not attempted. Tattooed. - One 8 mm polyp in the transverse colon, removed with a hot snare. Resected and retrieved. - One 10 mm polyp in the descending colon, removed with mucosal resection. Resected and retrieved. Clip was placed. - One 8 mm polyp in the sigmoid colon, removed with a hot snare. Resected and retrieved. - The distal rectum and anal verge are normal on retroflexion view.  Pathology DIAGNOSIS:  A. COLON POLYP X 2, CECUM; HOT SNARE:  - TUBULAR ADENOMAS, 3 FRAGMENTS.  - NEGATIVE FOR HIGH-GRADE DYSPLASIA AND MALIGNANCY.   B. COLON POLYP X 3, ASCENDING; HOT SNARE (1) AND COLD SNARE (2):  - TUBULAR ADENOMAS, AT LEAST 6 FRAGMENTS.  - NEGATIVE FOR HIGH-GRADE DYSPLASIA AND MALIGNANCY.   C. COLD POLYP, ASCENDING; HOT SNARE:  - TUBULAR ADENOMA AND BLOOD CLOT.  - NEGATIVE FOR HIGH-GRADE DYSPLASIA AND MALIGNANCY.   D. COLON POLYP, TRANSVERSE; HOT SNARE:  - TUBULAR ADENOMA, 2 FRAGMENTS.  - NEGATIVE FOR HIGH-GRADE DYSPLASIA AND MALIGNANCY.   E. COLON POLYP, DESCENDING; HOT SNARE:  - TUBULAR ADENOMA, 4 FRAGMENTS.  - NEGATIVE FOR HIGH-GRADE DYSPLASIA AND MALIGNANCY.   F. COLON POLYP, SIGMOID; HOT SNARE:  - TUBULAR ADENOMA, MULTIPLE FRAGMENTS.  - NEGATIVE FOR HIGH-GRADE DYSPLASIA AND MALIGNANCY.   She has  family history of colon cancer in her grandmother, aunt and uncle  ROS:  General: Negative for anorexia, weight loss, fever, chills, fatigue, weakness. ENT: Negative for hoarseness, difficulty swallowing , nasal congestion. CV: Negative for chest pain, angina, palpitations, dyspnea on exertion, peripheral edema.  Respiratory: Negative for dyspnea at rest, dyspnea on exertion, cough, sputum, wheezing.    GI: See history of present illness. GU:  Negative for dysuria, hematuria, urinary incontinence, urinary frequency, nocturnal urination.  Endo: Negative for unusual weight change.    Physical Examination:   BP (!) 158/71   Pulse 69   Temp 99.1 F (37.3 C) (Oral)   Ht 5\' 10"  (1.778 m)   Wt 154 lb 3.2 oz (69.9 kg)   BMI 22.13 kg/m   General: Well-nourished, well-developed in no acute distress.  Eyes: No icterus. Conjunctivae pink. Mouth: Oropharyngeal mucosa moist and pink , no lesions erythema or exudate. Lungs: Clear to auscultation bilaterally. Non-labored. Heart: Regular rate and rhythm, no murmurs rubs or gallops.  Abdomen: Bowel sounds are normal, nontender, nondistended, no hepatosplenomegaly or masses, no hernia , no rebound or guarding.   Extremities: No lower extremity edema. No clubbing or deformities. Neuro: Alert and oriented x 3.  Grossly intact. Skin: Warm and dry, no jaundice.   Psych: Alert and cooperative, normal mood and affect.   Imaging Studies: Ct Chest Lung Cancer Screening Low Dose Wo Contrast  Result Date: 03/12/2017 CLINICAL DATA:  67 year old female current smoker with 34 pack-year history of smoking. Lung cancer screening examination. EXAM: CT CHEST WITHOUT CONTRAST LOW-DOSE FOR LUNG CANCER SCREENING TECHNIQUE: Multidetector CT imaging of the chest was performed following the standard protocol without IV contrast. COMPARISON:  No priors. FINDINGS: Cardiovascular: Heart size is normal. There is no significant pericardial fluid, thickening or pericardial calcification. Aortic atherosclerosis. No coronary artery calcifications. Mediastinum/Nodes: No pathologically enlarged mediastinal or hilar lymph nodes. Please note that accurate exclusion of hilar adenopathy is limited on noncontrast CT scans. Esophagus is unremarkable in appearance. No axillary lymphadenopathy. Lungs/Pleura: Small pulmonary nodules in the periphery of the left upper lobe near the apex,  largest of which has a volume derived mean diameter of 3.9 mm on axial image 60 of series 3. No other larger more suspicious appearing pulmonary nodules or masses are noted. There is extensive right apical mass-like pleuroparenchymal thickening and architectural distortion which is partially calcified, most compatible with chronic post infectious or inflammatory scarring. No acute consolidative airspace disease. No pleural effusions. Mild diffuse bronchial wall thickening with very mild centrilobular and paraseptal emphysema. Upper Abdomen: Aortic atherosclerosis. Musculoskeletal: Soft tissue calcifications superficial to the posterior aspect of the left second rib, nonspecific but likely sequela of remote trauma. There are no aggressive appearing lytic or blastic lesions noted in the visualized portions of the skeleton. IMPRESSION: 1. Lung-RADS 2, benign appearance or behavior. Continue annual screening with low-dose chest CT without contrast in 12 months. 2. Aortic atherosclerosis. 3. Diffuse bronchial wall thickening with very mild centrilobular and paraseptal emphysema; imaging findings suggestive of underlying COPD. Aortic Atherosclerosis (ICD10-I70.0) and Emphysema (ICD10-J43.9). Electronically Signed   By: Vinnie Langton M.D.   On: 03/12/2017 08:53    Assessment and Plan:   Ludmilla Mcgillis is a 67 y.o. female with family history of colon cancer in multiple second-degree relatives, underwent first screening colonoscopy in 03/2017, found to have at least 10 polyps pathology was consistent with tubular adenomas, no evidence of dysplasia. She has large flat polyp in ascending colon and incompletely removed flat polyp in  ascending colon.  Therefore, I will refer her to [facility], advanced endoscopist Dr Jola Schmidt for EMR of large flat polyps  I will referred her to [facility] for genetic testing to evaluate for hereditary colon cancer syndromes after removal of large polyps  She will need repeat colonoscopy  in 6 months to 1 year based on the results of EMR of large polyps  Patient expressed understanding of the plan  Follow up in 4-6 months   Dr Sherri Sear, MD

## 2017-04-09 DIAGNOSIS — H2513 Age-related nuclear cataract, bilateral: Secondary | ICD-10-CM | POA: Diagnosis not present

## 2017-04-12 ENCOUNTER — Telehealth: Payer: Self-pay | Admitting: Gastroenterology

## 2017-04-12 NOTE — Telephone Encounter (Signed)
Patient called and has not heard from [facility] for the appointment.

## 2017-04-14 ENCOUNTER — Other Ambulatory Visit: Payer: Self-pay

## 2017-04-14 ENCOUNTER — Telehealth: Payer: Self-pay | Admitting: Gastroenterology

## 2017-04-14 ENCOUNTER — Other Ambulatory Visit: Payer: Medicare Other

## 2017-04-14 DIAGNOSIS — K635 Polyp of colon: Secondary | ICD-10-CM

## 2017-04-14 DIAGNOSIS — Z800 Family history of malignant neoplasm of digestive organs: Secondary | ICD-10-CM

## 2017-04-14 NOTE — Telephone Encounter (Signed)
Pt is calling  About referral to [facility] she has been trying to reach [place] about this and would like to know what is going on please call pt

## 2017-04-14 NOTE — Telephone Encounter (Signed)
LVM for patient to give updates on her referrals.  Referral to [facility] was originally sent on 04/05/17. I left message for nurse to call me back with an update. And I've refaxed the referral. The referral to [facility] for the genetic testing has been entered and someone will contact her with an appt within the next few days.  It is in the work que. Thanks [employer]

## 2017-04-15 ENCOUNTER — Telehealth: Payer: Self-pay

## 2017-04-15 ENCOUNTER — Telehealth: Payer: Self-pay | Admitting: Genetics

## 2017-04-15 ENCOUNTER — Encounter: Payer: Self-pay | Admitting: Genetics

## 2017-04-15 DIAGNOSIS — Z800 Family history of malignant neoplasm of digestive organs: Secondary | ICD-10-CM

## 2017-04-15 NOTE — Telephone Encounter (Signed)
Spoke with Seth Bake regarding Genetic Counseling Referral.  This referral has been entered in [facility] .  Thanks [employer]

## 2017-04-15 NOTE — Telephone Encounter (Signed)
A genetic counseling appt has been scheduled for the pt to see Ferol Luz on 5/2 at 3pm. Letter mailed to the pt.

## 2017-04-16 ENCOUNTER — Ambulatory Visit: Payer: Medicare Other | Admitting: Hematology and Oncology

## 2017-04-19 ENCOUNTER — Telehealth: Payer: Self-pay | Admitting: Gastroenterology

## 2017-04-19 NOTE — Telephone Encounter (Signed)
Patient calling about [facility] referral.

## 2017-04-21 ENCOUNTER — Telehealth: Payer: Self-pay

## 2017-04-21 DIAGNOSIS — C44712 Basal cell carcinoma of skin of right lower limb, including hip: Secondary | ICD-10-CM | POA: Diagnosis not present

## 2017-04-21 DIAGNOSIS — D229 Melanocytic nevi, unspecified: Secondary | ICD-10-CM | POA: Diagnosis not present

## 2017-04-21 DIAGNOSIS — C44519 Basal cell carcinoma of skin of other part of trunk: Secondary | ICD-10-CM | POA: Diagnosis not present

## 2017-04-21 DIAGNOSIS — L814 Other melanin hyperpigmentation: Secondary | ICD-10-CM | POA: Diagnosis not present

## 2017-04-21 DIAGNOSIS — L570 Actinic keratosis: Secondary | ICD-10-CM | POA: Diagnosis not present

## 2017-04-21 NOTE — Telephone Encounter (Signed)
Contacted Jennifer [name] at [facility], she stated that they've tried to contact patient this am to schedule her for an appt. They will keep trying to get in touch with her.  I contacted Jennifer [name] to inform her of this and provided her with phone # to [facility].   LVM.  Thanks [employer]

## 2017-04-30 ENCOUNTER — Telehealth: Payer: Self-pay | Admitting: Genetics

## 2017-04-30 DIAGNOSIS — K635 Polyp of colon: Secondary | ICD-10-CM | POA: Diagnosis not present

## 2017-04-30 NOTE — Telephone Encounter (Signed)
Left message for patient regarding [facility] appointment for Monday 05/03/17 per sched msg 4/5/ Date/Time Location

## 2017-05-03 ENCOUNTER — Encounter: Payer: Self-pay | Admitting: Genetics

## 2017-05-03 ENCOUNTER — Inpatient Hospital Stay: Payer: Medicare Other

## 2017-05-03 ENCOUNTER — Inpatient Hospital Stay: Payer: Medicare Other | Attending: Genetic Counselor | Admitting: Genetics

## 2017-05-03 DIAGNOSIS — Z800 Family history of malignant neoplasm of digestive organs: Secondary | ICD-10-CM | POA: Diagnosis not present

## 2017-05-03 DIAGNOSIS — Z808 Family history of malignant neoplasm of other organs or systems: Secondary | ICD-10-CM | POA: Diagnosis not present

## 2017-05-03 DIAGNOSIS — Z86010 Personal history of colonic polyps: Secondary | ICD-10-CM

## 2017-05-03 NOTE — Progress Notes (Addendum)
REFERRING PROVIDER: Ileana Roup, MD [place], [place] [address]  PRIMARY PROVIDER:  Trinna Post, PA-C  PRIMARY REASON FOR VISIT:  1. History of colonic polyps   2. Family history of colon cancer     HISTORY OF PRESENT ILLNESS:   [name], a 67 y.o. female, was seen for a [facility] cancer genetics consultation at the request of Dr. Dema Severin due to a personal history of colon polyps and family history of colon cancer.  Jennifer [name] presents to clinic today to discuss the possibility of a hereditary predisposition to cancer, genetic testing, and to further clarify her future cancer risks, as well as potential cancer risks for family members.   Colonoscopy in March 2019 revealed many (>10) colon polyps.  The patient reports there were 27 polyps.  Pathology revealed several of these were tubular adenomas.  No all of the polyps could be resected during her colonoscopy.  She will be having the largest one removed surgically.  She reports this was her first colonoscopy.  The results of genetic testing may impact her surgical decisions.    HORMONAL RISK FACTORS:  Menarche was at age 31.  Ovaries intact: no.  Hysterectomy: yes.  Menopausal status: postmenopausal.  Mammogram within the last year: no. Reports she is due, has had several in the past that were all normal.  Number of breast biopsies: 0.   Past Medical History:  Diagnosis Date  . Family history of colon cancer     Past Surgical History:  Procedure Laterality Date  . COLONOSCOPY WITH PROPOFOL N/A 03/29/2017   Procedure: COLONOSCOPY WITH PROPOFOL;  Surgeon: Lin Landsman, MD;  Location: [facility] ENDOSCOPY;  Service: Gastroenterology;  Laterality: N/A;  . HYSTEROTOMY  1996    Social History   Socioeconomic History  . Marital status: Single    Spouse name: Not on file  . Number of children: Not on file  . Years of education: Not on file  . Highest education level: Not on file  Occupational History  .  Not on file  Social Needs  . Financial resource strain: Not on file  . Food insecurity:    Worry: Not on file    Inability: Not on file  . Transportation needs:    Medical: Not on file    Non-medical: Not on file  Tobacco Use  . Smoking status: Current Every Day Smoker    Packs/day: 0.75    Years: 45.00    Pack years: 33.75    Types: Cigarettes  . Smokeless tobacco: Never Used  Substance and Sexual Activity  . Alcohol use: Yes    Alcohol/week: 7.2 oz    Types: 12 Shots of liquor per week  . Drug use: No  . Sexual activity: Not on file  Lifestyle  . Physical activity:    Days per week: Not on file    Minutes per session: Not on file  . Stress: Not on file  Relationships  . Social connections:    Talks on phone: Not on file    Gets together: Not on file    Attends religious service: Not on file    Active member of club or organization: Not on file    Attends meetings of clubs or organizations: Not on file    Relationship status: Not on file  Other Topics Concern  . Not on file  Social History Narrative  . Not on file     FAMILY HISTORY:  We obtained a  detailed, 4-generation family history.  Significant diagnoses are listed below: Family History  Problem Relation Age of Onset  . Diabetes Mother   . Kidney disease Mother   . Heart attack Father   . Diabetes Maternal Grandmother   . Colon cancer Maternal Grandfather   . Heart disease Paternal Grandmother   . Colon cancer Paternal Uncle        dx >50, colon completely removed  . Colon cancer Paternal Aunt 32   Jennifer [name] had 1 son who died at 41 in a car accident.  Jennifer [name] has 1 maternal half-brother who died at 97 due to a drug overdose.  He had some prostate problems at the time of his death, but she is not sure if it was cancer/ was not fully worked up at his time of death.    Jennifer [name]'s father died at 67 due to a heart attack. Jennifer [name] has 1 paternal aunt, but has limited information about her. This aunt had 3-4  daughters, but Ms. Chimenti does not know about these cousins' health.  Ms. Cundy does not know anything about her paternal grandfather, and her paternal grandmother died during heart surgery.   Jennifer [name] mother is 31 with no history of cancer.  She is unsure if her mother has every had a colonoscopy or any colon polyps.  Her mother never talked about this.  Jennifer [name] has 4 maternal uncles and  -1 maternal uncle was diagnosed with colon cancer >51 years of age.  She reports his colon was completely removed and he had a colectomy bag. He had no children.  -1 maternal aunt died of colon cancer that had metastasized to her liver in her 71's.  She had a son who died at 16 with no history of cancer.  -1 maternal uncle is alive in his 60's with no history of cancer.  He had a son who had throat cancer twice and is in his 50's/60's. Other maternal aunts/uncles/cousins have no history of cancer.  Jennifer [name]'s maternal grandfather had colon cancer- age of dx unknown.  Jennifer [name] maternal grandmother died in her 63's due to diabetes and a heart attack.   Jennifer [name] is  of previous family history of genetic testing for hereditary cancer risks. Patient's maternal ancestors are of  Caucasian   descent, and paternal ancestors are of Caucasian descent. There is no reported Ashkenazi Jewish ancestry. There is no known consanguinity.  GENETIC COUNSELING ASSESSMENT: Jennifer [name] is a 67 y.o. female with a personal and family history which is somewhat suggestive of a Hereditary Cancer Predisposition Syndrome. We, therefore, discussed and recommended the following at today's visit.   DISCUSSION: We reviewed the characteristics, features and inheritance patterns of hereditary cancer syndromes. We also discussed genetic testing, including the appropriate family members to test, the process of testing, insurance coverage and turn-around-time for results. We discussed the implications of a negative, positive and/or variant of uncertain  significant result. We recommended Jennifer [name] pursue genetic testing for the Common Hereditary Cancer gene panel. The Common Hereditary Cancer Panel offered by [facility] includes sequencing and/or deletion duplication testing of the following 47 genes: APC, ATM, AXIN2, BARD1, BMPR1A, BRCA1, BRCA2, BRIP1, CDH1, CDKN2A (p14ARF), CDKN2A (p16INK4a), CKD4, CHEK2, CTNNA1, DICER1, EPCAM (Deletion/duplication testing only), GREM1 (promoter region deletion/duplication testing only), KIT, MEN1, MLH1, MSH2, MSH3, MSH6, MUTYH, NBN, NF1, NHTL1, PALB2, PDGFRA, PMS2, POLD1, POLE, PTEN, RAD50, RAD51C, RAD51D, SDHB, SDHC, SDHD, SMAD4, SMARCA4. STK11, TP53, TSC1, TSC2, and VHL.  The  following genes were evaluated for sequence changes only: SDHA and HOXB13 c.251G>A variant only.  When an individual develops multiple adenomatous colon polyps, there is concern for a condition called Familial Adenomatous Polyposis (FAP).   In the classic form of FAP, people generally have hundreds to thousands of adenomatous polyps.  A milder version of FAP called Attenuated FAP (AFAP) is characterized by less than 100 adenomatous polyps. This condition is caused by mutations in the APC gene.  This condition is autosomal dominant, and in about 30% of cases, the condition is denovo.   There are several genes that have been associated with polyposis, and have different inheritance patterns.  Colon polyposis can also be caused by mutations in the MUTYH gene, which causes a condition known as MUTYH-associated polyposis.  This is an autosomal recessive genetic condition.  In this case, an individual needs to have inherited a mutation in each copy of the MYH gene, one from each parent, in order to develop polyposis.  Carrying just one mutation of the [facility] gene does not typically cause any problems or place an individual at risk for cancer.   Another syndrome we that increases colon cancer risk is called Lynch Syndrome, also called HNPCC (Hereditary  Non-Polyposis Colon Cancer).  This syndrome increases the risk for colon, uterine, ovarian and stomach cancers, brain cancers, as well as others.  Families with Lynch Syndrome tend to have multiple family members with these cancers, typically diagnosed under age 52, and diagnoses in multiple generations. The genes that are known to cause Lynch Syndrome are called MLH1, MSH2, MSH6, PMS2 and EPCAM.    Some families that appear to have any of these conditions will have normal testing of these genes.  In those cases it is possible that the large amount of colon polyps may be causes by a syndrome called Serrated Polyposis Syndrome.  This condition causes an abnormally large amount of serrate polyps to develop and believe to be hereditary.  However, the genetic cause of this syndrome has not yet been identified.    We discussed that if she is found to have a mutation in one of these genes, it may impact future medical management recommendations such as increased cancer screenings and consideration of risk reducing surgeries.  A positive result could also have implications for the patient's family members.  A Negative result would mean we were unable to identify a hereditary component to her development of adenomatous polyps, but does not rule out the possibility of a hereditary basis for her polyps. There could be mutations that are undetectable by current technology, or in genes not yet tested or identified to increase cancer risk.    We discussed the potential to find a Variant of Uncertain Significance or VUS.  These are variants that have not yet been identified as pathogenic or benign, and it is unknown if this variant is associated with increased cancer risk or if this is a normal finding.  Most VUS's are reclassified to benign or likely benign.   It should not be used to make medical management decisions. With time, we suspect the lab will determine the significance of any VUS's identified if any.    Based on Jennifer [name]'s personal and family history of cancer, she meets medical criteria for genetic testing. Despite that she meets criteria, she may still have an out of pocket cost. We discussed that if her out of pocket cost for testing is over $100, the laboratory will call and confirm whether she wants  to proceed with testing.  If the out of pocket cost of testing is less than $100 she will be billed by the genetic testing laboratory.   We discussed that some people do not want to undergo genetic testing due to fear of genetic discrimination.  A federal law called the Genetic Information Non-Discrimination Act (GINA) of 2008 helps protect individuals against genetic discrimination based on their genetic test results.  It impacts both health insurance and employment.  For health insurance, it protects against increased premiums, being kicked off insurance or being forced to take a test in order to be insured.  For employment it protects against hiring, firing and promoting decisions based on genetic test results.  Health status due to a cancer diagnosis is not protected under GINA.  This law does not protect life insurance, disability insurance, or other types of insurance.   PLAN: After considering the risks, benefits, and limitations, Jennifer [name]  provided informed consent to pursue genetic testing and the blood sample was sent to [facility] for analysis of the Common Hereditary Cancers Panel. Results should be available within approximately 2-3 weeks' time, at which point they will be disclosed by telephone to Jennifer [name], as will any additional recommendations warranted by these results. Jennifer [name] will receive a summary of her genetic counseling visit and a copy of her results once available. This information will also be available in Epic. We encouraged Jennifer [name] to remain in contact with cancer genetics annually so that we can continuously update the family history and inform her of any changes in  cancer genetics and testing that may be of benefit for her family. Ms. Staheli questions were answered to her satisfaction today. Our contact information was provided should additional questions or concerns arise.  Based on Ms. Rosengren's family history, we recommended her maternal relatives also have genetic counseling and testing. Ms. Muise will let us know if we can be of any assistance in coordinating genetic counseling and/or testing for this family member.   Lastly, we encouraged Ms. Tasker to remain in contact with cancer genetics annually so that we can continuously update the family history and inform her of any changes in cancer genetics and testing that may be of benefit for this family.   Ms.  Gombos questions were answered to her satisfaction today. Our contact information was provided should additional questions or concerns arise. Thank you for the referral and allowing [place] to share in the care of your patient.   Tana Felts, MS Genetic Counselor [email or web address] phone: [phone]  The patient was seen for a total of 40 minutes in face-to-face genetic counseling.

## 2017-05-10 ENCOUNTER — Ambulatory Visit (INDEPENDENT_AMBULATORY_CARE_PROVIDER_SITE_OTHER): Payer: Medicare Other | Admitting: Physician Assistant

## 2017-05-10 ENCOUNTER — Encounter: Payer: Self-pay | Admitting: Physician Assistant

## 2017-05-10 VITALS — BP 136/76 | HR 68 | Temp 98.4°F | Resp 16 | Wt 152.0 lb

## 2017-05-10 DIAGNOSIS — K635 Polyp of colon: Secondary | ICD-10-CM | POA: Diagnosis not present

## 2017-05-10 DIAGNOSIS — R51 Headache: Secondary | ICD-10-CM

## 2017-05-10 DIAGNOSIS — R519 Headache, unspecified: Secondary | ICD-10-CM

## 2017-05-10 DIAGNOSIS — E785 Hyperlipidemia, unspecified: Secondary | ICD-10-CM

## 2017-05-10 NOTE — Progress Notes (Signed)
Patient: Jennifer [name] Female    DOB: 13-Aug-1950   67 y.o.   MRN: [id number] Visit Date: 05/10/2017  Today's Provider: Trinna Post, PA-C   Chief Complaint  Patient presents with  . Hyperlipidemia    Six week follow up   Subjective:    Jennifer [name] is a 67 y/o woman with history of tobacco abuse, alcohol abuse and HLD presenting today for follow up of HLD. Last visit, she was stated on 10 mg Lipitor and she has additionally cut out dietary sources of cholesterol. She is still smoking, does not desire to quit right now. She is tolerating the medication well and is not experiencing any side effects.   In the interim, she has had a colonoscopy which revealed 10 polyps, one of which was 2.5 cm and unable to be removed completely. Dr. Barb Merino, GI doctor, referred her for advanced endoscopy at [facility] but this appointment was too far out and so she scheduled with Dr. Dema Severin at [facility]. She was referred for genetic testing of cancerous syndromes and this is pending. Per the surgery note, the surgeon seems to be considering at least a right hemicolectomy due to right sided predominance of right sided polyps if genetic testing is negative, unclear of procedure if genetic testing is possible.  She also has noticed intermittent pains on the right side of her scalp for the past four days. She has a history of migraines, but reports this is not a headache. She has had no injuries. She does not report any visual changes, one sided weakness, speech difficulties. Does not notice any difference in pain when chewing or increasing activity.   Hyperlipidemia  This is a chronic problem. The problem is uncontrolled. Recent lipid tests were reviewed and are high. Pertinent negatives include no chest pain, focal sensory loss, focal weakness, leg pain, myalgias or shortness of breath. Current antihyperlipidemic treatment includes statins. There are no compliance problems.    Lab Results  Component  Value Date   CHOL 298 (H) 03/09/2017   Lab Results  Component Value Date   HDL 82 03/09/2017   Lab Results  Component Value Date   LDLCALC 184 (H) 03/09/2017   Lab Results  Component Value Date   TRIG 158 (H) 03/09/2017   Lab Results  Component Value Date   CHOLHDL 3.6 03/09/2017   No results found for: LDLDIRECT     No Known Allergies   Current Outpatient Medications:  .  atorvastatin (LIPITOR) 10 MG tablet, Take 1 tablet (10 mg total) by mouth daily., Disp: 90 tablet, Rfl: 0 .  nicotine (NICODERM CQ) 14 mg/24hr patch, Place 1 patch (14 mg total) onto the skin daily., Disp: 28 patch, Rfl: 0 .  nicotine (NICODERM CQ) 7 mg/24hr patch, Place 1 patch (7 mg total) onto the skin daily., Disp: 14 patch, Rfl: 0  Review of Systems  Constitutional: Negative.   Respiratory: Negative.  Negative for shortness of breath.   Cardiovascular: Negative.  Negative for chest pain.  Gastrointestinal: Negative.   Musculoskeletal: Negative.  Negative for myalgias.  Neurological: Negative for dizziness, focal weakness, light-headedness and headaches.    Social History   Tobacco Use  . Smoking status: Current Every Day Smoker    Packs/day: 0.75    Years: 45.00    Pack years: 33.75    Types: Cigarettes  . Smokeless tobacco: Never Used  Substance Use Topics  . Alcohol use: Yes    Alcohol/week: 7.2 oz  Types: 12 Shots of liquor per week   Objective:   BP 136/76 (BP Location: Right Arm, Patient Position: Sitting, Cuff Size: Normal)   Pulse 68   Temp 98.4 F (36.9 C) (Oral)   Resp 16   Wt 152 lb (68.9 kg)   BMI 21.81 kg/m  Vitals:   05/10/17 1346  BP: 136/76  Pulse: 68  Resp: 16  Temp: 98.4 F (36.9 C)  TempSrc: Oral  Weight: 152 lb (68.9 kg)     Physical Exam  Constitutional: She is oriented to person, place, and time. She appears well-developed and well-nourished.  HENT:  Head: Normocephalic and atraumatic.  Right Ear: External ear normal.  Left Ear: External ear  normal.  Nose: Nose normal.  Mouth/Throat: Oropharynx is clear and moist.  Temporal artery non sclerosed and non tender.   Neck: Neck supple.  Cardiovascular: Normal rate and regular rhythm.  Pulmonary/Chest: Effort normal and breath sounds normal.  Lymphadenopathy:    She has no cervical adenopathy.  Neurological: She is alert and oriented to person, place, and time.  Skin: Skin is warm and dry.  Psychiatric: She has a normal mood and affect. Her behavior is normal.        Assessment & Plan:     1. Hyperlipidemia, unspecified hyperlipidemia type  - Lipid Profile  2. Polyp of colon, unspecified part of colon, unspecified type  I have reviewed her colonoscopy, notes with Dr. Barb Merino, notes with surgery and explained them to her at length. She is meeting with surgeon on Monday. It appears they will need genetic testing to make final decisions on how they will proceed. I explained that she should expect this to take one month.   3. Scalp pain  Exam benign today, though she does have smoking history and history of migraines. Patient wishes to observe. If worsening, would refer to neurology.   Return in about 3 months (around 08/09/2017) for HLD.  The entirety of the information documented in the History of Present Illness, Review of Systems and Physical Exam were personally obtained by me. Portions of this information were initially documented by Ashley Royalty, CMA and reviewed by me for thoroughness and accuracy.   I have spent 25 minutes with this patient, >50% of which was spent on counseling and coordination of care.      Trinna Post, PA-C  [facility]

## 2017-05-11 ENCOUNTER — Other Ambulatory Visit: Payer: Medicare Other

## 2017-05-11 ENCOUNTER — Telehealth: Payer: Self-pay

## 2017-05-11 LAB — LIPID PANEL
Chol/HDL Ratio: 2 ratio (ref 0.0–4.4)
Cholesterol, Total: 197 mg/dL (ref 100–199)
HDL: 99 mg/dL (ref >39)
LDL Calculated: 75 mg/dL (ref 0–99)
Triglycerides: 116 mg/dL (ref 0–149)
VLDL Cholesterol Cal: 23 mg/dL (ref 5–40)

## 2017-05-11 NOTE — Telephone Encounter (Signed)
-----   Message from Trinna Post, [place] sent at 05/11/2017  8:19 AM EDT ----- Cholesterol has greatly improved to goal with diet and medication changes. Keep up the good work, do not have to change the dose. Will see her at follow up in 3 mo.

## 2017-05-11 NOTE — Telephone Encounter (Signed)
Pt advised.   Thanks,   -Tammey Deeg  

## 2017-05-11 NOTE — Telephone Encounter (Signed)
Pt returned call ° °[name] °

## 2017-05-11 NOTE — Telephone Encounter (Signed)
LMTCB 05/11/2017  Thanks,   -Mickel Baas

## 2017-05-13 ENCOUNTER — Ambulatory Visit: Payer: Self-pay | Admitting: Genetics

## 2017-05-13 ENCOUNTER — Telehealth: Payer: Self-pay | Admitting: Genetics

## 2017-05-13 ENCOUNTER — Encounter: Payer: Self-pay | Admitting: Genetics

## 2017-05-13 DIAGNOSIS — Z1379 Encounter for other screening for genetic and chromosomal anomalies: Secondary | ICD-10-CM | POA: Insufficient documentation

## 2017-05-13 DIAGNOSIS — Z86010 Personal history of colonic polyps: Secondary | ICD-10-CM

## 2017-05-13 DIAGNOSIS — Z800 Family history of malignant neoplasm of digestive organs: Secondary | ICD-10-CM

## 2017-05-13 DIAGNOSIS — Z87891 Personal history of nicotine dependence: Secondary | ICD-10-CM

## 2017-05-13 NOTE — Progress Notes (Signed)
HPI: Ms. Hassan was previously seen in the [place] clinic on 05/03/2017 due to a personal history of colon polyps and family history of colon cancer Please refer to our prior cancer genetics clinic note for more information regarding Ms. Capshaw's medical, social and family histories, and our assessment and recommendations, at the time. Ms. Hults recent genetic test results were disclosed to her, as well as recommendations warranted by these results. These results and recommendations are discussed in more detail below.   FAMILY HISTORY:  We obtained a detailed, 4-generation family history.  Significant diagnoses are listed below: Family History  Problem Relation Age of Onset  . Diabetes Mother   . Kidney disease Mother   . Heart attack Father   . Diabetes Maternal Grandmother   . Colon cancer Maternal Grandfather   . Heart disease Paternal Grandmother   . Colon cancer Paternal Uncle        dx >50, colon completely removed  . Colon cancer Paternal Aunt 39    Ms. Mckinstry had 1 son who died at 38 in a car accident.  Ms. Custodio has 1 maternal half-brother who died at 66 due to a drug overdose.  He had some prostate problems at the time of his death, but she is not sure if it was cancer/ was not fully worked up at his time of death.    Ms. Kratz's father died at 17 due to a heart attack. Ms. Rehmann has 1 paternal aunt, but has limited information about her. This aunt had 3-4 daughters, but Ms. Velis does not know about these cousins' health.  Ms. Larkin does not know anything about her paternal grandfather, and her paternal grandmother died during heart surgery.   Ms. Holsworth mother is 14 with no history of cancer.  She is unsure if her mother has every had a colonoscopy or any colon polyps.  Her mother never talked about this.  Ms. Gebhardt has 4 maternal uncles and  -1 maternal uncle was diagnosed with colon cancer >36 years of age.  She reports his colon was completely removed and he had a  colectomy bag. He had no children.  -1 maternal aunt died of colon cancer that had metastasized to her liver in her 64's.  She had a son who died at 57 with no history of cancer.  -1 maternal uncle is alive in his 46's with no history of cancer.  He had a son who had throat cancer twice and is in his 50's/60's. Other maternal aunts/uncles/cousins have no history of cancer.  Ms. Walraven's maternal grandfather had colon cancer- age of dx unknown.  Ms. Stohr maternal grandmother died in her 38's due to diabetes and a heart attack.   Ms. Halliwell is  of previous family history of genetic testing for hereditary cancer risks. Patient's maternal ancestors are of  Caucasian   descent, and paternal ancestors are of Caucasian descent. There is no reported Ashkenazi Jewish ancestry. There is no known consanguinity.  GENETIC TEST RESULTS: Genetic testing performed through [facility]'s Common Hereditary Cancers Panel reported out on 05/12/2017 showed no pathogenic mutations. The Common Hereditary Cancer Panel offered by [facility] includes sequencing and/or deletion duplication testing of the following 47 genes: APC, ATM, AXIN2, BARD1, BMPR1A, BRCA1, BRCA2, BRIP1, CDH1, CDKN2A (p14ARF), CDKN2A (p16INK4a), CKD4, CHEK2, CTNNA1, DICER1, EPCAM (Deletion/duplication testing only), GREM1 (promoter region deletion/duplication testing only), KIT, MEN1, MLH1, MSH2, MSH3, MSH6, MUTYH, NBN, NF1, NHTL1, PALB2, PDGFRA, PMS2, POLD1, POLE, PTEN, RAD50, RAD51C, RAD51D, SDHB,  SDHC, SDHD, SMAD4, SMARCA4. STK11, TP53, TSC1, TSC2, and VHL.  The following genes were evaluated for sequence changes only: SDHA and HOXB13 c.251G>A variant only..  The test report will be scanned into EPIC and will be located under the Molecular Pathology section of the Results Review tab.A portion of the result report is included below for reference.     We discussed with Ms. Frieden that because current genetic testing is not perfect, it is possible there may be a gene  mutation in one of these genes that current testing cannot detect, but that chance is small. We also discussed, that there could be another gene that has not yet been discovered, or that we have not yet tested, that is responsible for the cancer diagnoses in the family. It is also possible there is a hereditary cause for the cancer in the family that Ms. Alfredo did not inherit and therefore was not identified in her testing.  Therefore, it is important to remain in touch with cancer genetics in the future so that we can continue to offer Ms. Speak the most up to date genetic testing.   ADDITIONAL GENETIC TESTING: We discussed with Ms. Moncada that there are other genes that are associated with increased cancer risk that can be analyzed. The laboratories that offer this testing look at these additional genes via a hereditary cancer gene panel. Should Ms. Funderburk wish to pursue additional genetic testing, we are happy to discuss and coordinate this testing, at any time.    CANCER SCREENING RECOMMENDATIONS: This negative result means that we did not  identify a hereditary cause for personal history of colon polyps and family history of cancer at this time.  While reassuring, this does not definitively rule out a hereditary basis for her polyps. It is still possible that there could be genetic mutations that are undetectable by current technology, or genetic mutations in genes that have not been tested or identified to increase cancer risk.    An individual's cancer risk and medical management are not determined by genetic test results alone.  Factors such as personal medical history and  family history should still be considered.  Given her personal history of colon polyps and family history of colon cancer, Ms. Koehl should still have increased colon surveillance as determined by her GI physicians.    RECOMMENDATIONS FOR FAMILY MEMBERS: Relatives in this family might be at some increased risk of developing cancer,  over the general population risk, simply due to the family history of cancer. We recommended women in this family have a yearly mammogram beginning at age 21, or 64 years younger than the earliest onset of cancer, an annual clinical breast exam, and perform monthly breast self-exams. Women in this family should also have a gynecological exam as recommended by their primary provider. All family members should have a colonoscopy by age 64 (or earlier/more often as determined by their physicians).  All family members should inform their physicians about the family history of cancer so their doctors can make the most appropriate screening recommendations for them.   It is also still possible there is a hereditary cause for the cancer in Ms. Shukla's family that she did not inherit and therefore was not identified in her testing.  We therefore recommended her maternal relatives also, have genetic counseling and testing. Ms. Kuznicki will let us know if we can be of any assistance in coordinating genetic counseling and/or testing for these family members.   FOLLOW-UP: Lastly, we discussed  with Ms. Bittel that cancer genetics is a rapidly advancing field and it is possible that new genetic tests will be appropriate for her and/or her family members in the future. We encouraged her to remain in contact with cancer genetics on an annual basis so we can update her personal and family histories and let her know of advances in cancer genetics that may benefit this family.   Our contact number was provided. Ms. Yerby questions were answered to her satisfaction, and she knows she is welcome to call us at anytime with additional questions or concerns.   Ferol Luz, MS, [facility] Certified Genetic Counselor [email or web address]

## 2017-05-13 NOTE — Telephone Encounter (Addendum)
Revealed negative genetic testing.   This normal result means we did not identify a hereditary cause for Jennifer [name]'s history of colon polyps or family history of colon cancer.   However, genetic testing is not perfect, and cannot definitively rule out a hereditary cause.  It will be important for her to keep in contact with genetics to learn if any additional testing may be needed in the future.   We recommended her maternal relatives also have genetic testing as there could still be a hereditary cause for the cancer in her family that [name] did not inherit and therefore was not found in her genetic testing.   She should continue to follow all surgical/screening/management recommendations provided to her by her physicians based on personal/family history.

## 2017-05-17 ENCOUNTER — Ambulatory Visit: Payer: Self-pay | Admitting: Surgery

## 2017-05-17 ENCOUNTER — Telehealth: Payer: Self-pay | Admitting: Physician Assistant

## 2017-05-17 DIAGNOSIS — K635 Polyp of colon: Secondary | ICD-10-CM | POA: Diagnosis not present

## 2017-05-17 NOTE — H&P (Signed)
CC: Recent colonoscopy with endoscopically unresectable polyps in ascending colon  HPI: Jennifer [name] is a very pleasant 67yoF with hx of HLD, tobacco abuse, endometriosis, menorrhagia presents for evaluation following colonscopy. she underwent her first screening colonoscopy 03/29/17 by Dr. Marius Ditch. She was found to have: 1. 7 mm polyp in cecum-sessile-removed-tubular adenoma 2. 10 mm polyp-cecum-carpet-like-removed via EMR-tubular adenoma 3. 10 mm polyp in proximal ascending colon-carpet-like-removed via EMR-tubular adenoma 4. Two 5 mm polyps in the ascending colon - sessile-removed-tubular adenoma 5. 12 mm polyp in mid ASCENDING colon carpet-likeEMR-only partially removed-tubular adenoma - EMR used methylene blue [corrected location from prior note] 6. 2.5 cm polyp in the ASCENDING colon-carpet-like-not removed - tattooed [corrected location from prior note] 7. 8 mm polyp-transverse colon-removed-tubular adenoma 8. 10 mm polyp in descending colon-flat-EMR-tubular adenoma 9. 8 mm polyp and sigmoid-sessile-removed-tubular adenoma  Totaling 10 polyps  She recovered from this well. She denies any hx of rectal bleeding. She denies any recent hx of abdominal pain.  She had a possible referral in to [facility] to consideration of ESD of this 2.5cm polyp but has opted to pursue surgery for definitive management. We discussed the option of an ESD attempt as well but she would rather have the polyp addressed as though it's cancer.  INTERVAL HISTORY She completed her evaluation with genetics and tested negative for any known genetic mutation  PMH: HLD (controlled on statin), tobacco abuse (uncontrolled), endometriosis and menorrhagia s/p hysterectomy (now controlled)  PSH: Hysterectomy via vaginal approach; ?Pfannenstiel  FHx: Maternal grandfather with colon cancer Maternal uncle with colon cancer Maternal aunt with colon cancer  Social: Smokes 1/2PPD since teenager; social EtOH; denies illicit drug  use.  ROS: A comprehensive 10 system review of systems was completed with the patient and pertinent findings as noted above.  The patient is a 67 year old female.   Problem List/Past Medical Sharyn Lull R. Brooks, CMA; 05/17/2017 1:27 PM) COLON POLYP (K63.5)   Past Surgical History Sharyn Lull R. Brooks, CMA; 05/17/2017 1:27 PM) Colon Polyp Removal - Colonoscopy  Hysterectomy (not due to cancer) - Partial   Diagnostic Studies History Sharyn Lull R. Brooks, CMA; 05/17/2017 1:27 PM) Colonoscopy  within last year Pap Smear  >5 years ago  Allergies Sharyn Lull R. Brooks, CMA; 05/17/2017 1:27 PM) No Known Drug Allergies [04/30/2017]:  Medication History Sharyn Lull R. Brooks, CMA; 05/17/2017 1:27 PM) Lipitor (10MG  Tablet, Oral) Active. Medications Reconciled  Social History Sharyn Lull R. Brooks, CMA; 05/17/2017 1:27 PM) Alcohol use  Moderate alcohol use. Illicit drug use  Prefer to discuss with provider. Tobacco use  Current every day smoker.  Family History Sharyn Lull R. Rolena Infante, CMA; 05/17/2017 1:27 PM) Arthritis  Mother. Colon Cancer  Family Members In General. Diabetes Mellitus  Mother. Hypertension  Mother. Kidney Disease  Mother.  Pregnancy / Birth History Sharyn Lull R. Rolena Infante, CMA; 05/17/2017 1:27 PM) Age at menarche  22 years. Age of menopause  52-50 Contraceptive History  Intrauterine device. Gravida  4 Length (months) of breastfeeding  7-12 Maternal age  43-20 Para  1  Other Problems Sharyn Lull R. Brooks, CMA; 05/17/2017 1:27 PM) Emphysema Of Lung  Heart murmur  Hemorrhoids  Hypercholesterolemia  Kidney Stone  Melanoma     Review of Systems Harrell Gave M. Meliss Fleek MD; 05/17/2017 1:39 PM) General Present- Night Sweats. Not Present- Appetite Loss, Chills, Fatigue, Fever, Weight Gain and Weight Loss. Skin Present- Dryness. Not Present- Change in Wart/Mole, Hives, Jaundice, New Lesions, Non-Healing Wounds, Rash and Ulcer. HEENT Present- Seasonal Allergies  and Wears glasses/contact lenses. Not Present- Earache,  Hearing Loss, Hoarseness, Nose Bleed, Oral Ulcers, Ringing in the Ears, Sinus Pain, Sore Throat, Visual Disturbances and Yellow Eyes. Respiratory Not Present- Bloody sputum, Chronic Cough, Difficulty Breathing, Snoring and Wheezing. Breast Not Present- Breast Mass, Breast Pain, Nipple Discharge and Skin Changes. Cardiovascular Present- Leg Cramps and Rapid Heart Rate. Not Present- Chest Pain, Difficulty Breathing Lying Down, Palpitations, Shortness of Breath and Swelling of Extremities. Gastrointestinal Not Present- Abdominal Pain, Bloating, Bloody Stool, Change in Bowel Habits, Chronic diarrhea, Difficulty Swallowing, Excessive gas, Gets full quickly at meals, Hemorrhoids, Indigestion, Nausea, Rectal Pain and Vomiting. Female Genitourinary Not Present- Frequency, Nocturia, Painful Urination, Pelvic Pain and Urgency. Musculoskeletal Not Present- Back Pain, Joint Pain, Joint Stiffness, Muscle Pain, Muscle Weakness and Swelling of Extremities. Neurological Not Present- Decreased Memory, Fainting, Headaches, Numbness, Seizures, Tingling, Tremor, Trouble walking and Weakness. Psychiatric Not Present- Anxiety, Bipolar, Change in Sleep Pattern, Depression, Fearful and Frequent crying. Endocrine Present- Hair Changes. Not Present- Cold Intolerance, Excessive Hunger, Heat Intolerance, Hot flashes and New Diabetes. Hematology Present- Gland problems. Not Present- Blood Thinners, Easy Bruising, Excessive bleeding, HIV and Persistent Infections.  Vitals [employer] R. Brooks CMA; 05/17/2017 1:27 PM) 05/17/2017 1:27 PM Weight: 151.5 lb Height: 70in Body Surface Area: 1.85 m Body Mass Index: 21.74 kg/m  Pulse: 90 (Regular)  BP: 132/84 (Sitting, Left Arm, Standard)       Physical Exam Harrell Gave M. Caydee Talkington MD; 05/17/2017 1:39 PM) The physical exam findings are as follows: Note:Constitutional: No acute distress; conversant; no  deformities Eyes: Moist conjunctiva; no lid lag; anicteric sclerae; pupils equal round and reactive to light Neck: Trachea midline; no palpable thyromegaly Lungs: Normal respiratory effort; no tactile fremitus CV: Regular rate and rhythm; no palpable thrill; no pitting edema GI: Abdomen soft, nontender, nondistended; no palpable hepatosplenomegaly MSK: Normal gait; no clubbing/cyanosis Psychiatric: Appropriate affect; alert and oriented 3 Lymphatic: No palpable cervical or axillary lymphadenopathy    Assessment & Plan Harrell Gave M. Anzley Dibbern MD; 05/17/2017 1:40 PM) COLON POLYP (K63.5) Impression: Ms. Wallis is a very pleasant 84yoF with 10 colon polyps on her first screening colonoscopy. 1 was partially removed (ascending colon) and another was left in situ due to size in the ascending colon. This was tattoo'd.  -Completed genetics evaluation - results noted as negative -Will plan laparoscopic right hemicolectomy with final plans based on intraoperative findings of the location of the tattoos. -I discussed with her the anatomy and physiology of the GI tract again today, the pathophysiology of colon polyps and colon cancer and the recommendations for treatment. I did this with accompanying pictures as well as our laparoscopic colorectal surgery handout. -I worked to give her an overview of the surgical options for her conditions as well as the technical aspects of the procedure. -The planned procedure, material risks (including, but not limited to, pain, bleeding, infection, scarring, need for blood transfusion, damage to surrounding structures- blood vessels/nerves/viscus/organs, damage to ureter, urine leak, leak from anastomosis, need for additional procedures, need for stoma which may be permanent, hernia, recurrence, pneumonia, heart attack, stroke, death) benefits and alternatives to surgery were discussed at length. Her questions were answered to her satisfaction, she voiced understanding and  elected to proceed with surgery  Signed electronically by Ileana Roup, MD (05/17/2017 1:41 PM)

## 2017-05-17 NOTE — Telephone Encounter (Signed)
Pt is calling requesting call back due to a polyp that was found in coloscopy.  She state she will have to have surgery to remove some of her colon and the surgery isn't until middle of June.  She states this is not acceptable and she want to know if there is anything we can do to make them schedule quicker.

## 2017-05-18 NOTE — Telephone Encounter (Signed)
I have read her most recent visit note with her surgeon and see that she is scheduled for surgery on 07/16/2017 for right hemicolectomy. I see that her genetic testing came back negative for genetic syndromes. I understand her concern that her that this surgery is scheduled in June and the worry that comes with this. I think this is a typical time course for surgery of this nature, I do not think there will be any negative health effects from this. We do not have control of how surgery schedules procedures. I think the best option for her would be to let the scheduling office know that she has a flexible schedule as there are frequently cancellations and that she would like to be moved up if possible.

## 2017-05-18 NOTE — Telephone Encounter (Signed)
Advised patient as below. Patient wanted to know if this surgery is even needed? She reports that she has gone through so much in the last month, and feels like waiting until [place] of June is too long. Patient wants to forget the surgery altogether. Please review. Thanks!

## 2017-05-18 NOTE — Telephone Encounter (Signed)
Please review. Thanks!  

## 2017-05-18 NOTE — Telephone Encounter (Signed)
Left message to call back  

## 2017-05-20 NOTE — Telephone Encounter (Signed)
Just FYI. Called this patient personally to check how she felt about her upcoming surgery regarding her hemicolectomy. She had questioned whether she needed this surgery or not. She reports she has since spoken to the surgeon and been reassured. She reports she was offered slots with other surgeons, but she would rather stay with Dr. Dema Severin. I did offer her the opportunity for a second opinion with advanced endoscopy at [facility] which is where the initial referral went for possible endoscopic mucosal resection. She does not wish to do this because she feels like it would prolong the course of the treatment and she reports she has come to terms with the date of surgery and is going to proceed with it in June.

## 2017-05-24 DIAGNOSIS — C44519 Basal cell carcinoma of skin of other part of trunk: Secondary | ICD-10-CM | POA: Diagnosis not present

## 2017-05-27 ENCOUNTER — Other Ambulatory Visit: Payer: Medicare Other

## 2017-05-27 ENCOUNTER — Encounter: Payer: Medicare Other | Admitting: Genetics

## 2017-06-17 ENCOUNTER — Telehealth: Payer: Self-pay | Admitting: Physician Assistant

## 2017-06-17 NOTE — Telephone Encounter (Signed)
FYI--Pt was a no show for bone density

## 2017-07-07 ENCOUNTER — Other Ambulatory Visit: Payer: Self-pay | Admitting: Physician Assistant

## 2017-07-07 DIAGNOSIS — E7800 Pure hypercholesterolemia, unspecified: Secondary | ICD-10-CM

## 2017-07-08 NOTE — Patient Instructions (Addendum)
Jennifer [name]  07/08/2017   Your procedure is scheduled on: 07-16-17  Report to [facility] Main  Entrance  Report to admitting at        0530 AM    Call this number if you have problems the morning of surgery [phone]              Remember: FOLLOW BOWEL PREP PER YOUR MD ORDERS.              FOLLOW A CLEAR LIQUID DIET THE DAY OF YOUR BOWEL PREP TO PREVENT DEHYDRATION    CLEAR LIQUID DIET   Foods Allowed                                                                     Foods Excluded  Coffee and tea, regular and decaf                             liquids that you cannot  Plain Jell-O in any flavor                                             see through such as: Fruit ices (not with fruit pulp)                                     milk, soups, orange juice  Iced Popsicles                                    All solid food Carbonated beverages, regular and diet                                     WHITE grape and apple juices Sports drinks like Gatorade Lightly seasoned clear broth or consume(fat free) Sugar, honey syrup  Sample Menu Breakfast                                Lunch                                     Supper Cranberry juice                    Beef broth                            Chicken broth Jell-O                                     Grape juice  Apple juice Coffee or tea                        Jell-O                                      Popsicle                                                Coffee or tea                        Coffee or tea  _____________________________________________________________________                DRINK 2 PRESURGERY ENSURE DRINKS THE NIGHT BEFORE SURGERY AT  1000 PM AND 1 PRESURGERY DRINK THE DAY OF THE PROCEDURE 3 HOURS PRIOR TO SCHEDULED SURGERY. NO SOLIDS AFTER MIDNIGHT THE DAY PRIOR TO THE SURGERY. NOTHING BY MOUTH EXCEPT CLEAR LIQUIDS UNTIL THREE HOURS PRIOR TO SCHEDULED  SURGERY. PLEASE FINISH PRESURGERY ENSURE DRINK PER SURGEON ORDER 3 HOURS PRIOR TO SCHEDULED SURGERY TIME WHICH NEEDS TO BE COMPLETED AT     0430 AM  NOTHING BY MOUTH AFTER 0430 AM      Take these medicines the morning of surgery with A SIP OF WATER: ATORVASTATIN                                You may not have any metal on your body including hair pins and              piercings  Do not wear jewelry, make-up, lotions, powders or perfumes, deodorant             Do not wear nail polish.  Do not shave  48 hours prior to surgery.         Do not bring valuables to the hospital. [place].  Contacts, dentures or bridgework may not be worn into surgery.  Leave suitcase in the car. After surgery it may be brought to your room.     Patients discharged the day of surgery will not be allowed to drive home.  Name and phone number of your driver:  Special Instructions: N/A              Please read over the following fact sheets you were given: _____________________________________________________________________             [facility] - Preparing for Surgery Before surgery, you can play an important role.  Because skin is not sterile, your skin needs to be as free of germs as possible.  You can reduce the number of germs on your skin by washing with CHG (chlorahexidine gluconate) soap before surgery.  CHG is an antiseptic cleaner which kills germs and bonds with the skin to continue killing germs even after washing. Please DO NOT use if you have an allergy to CHG or antibacterial soaps.  If your skin becomes reddened/irritated stop using the CHG and inform your nurse when you arrive at Short Stay. Do not shave (including legs and underarms)  for at least 48 hours prior to the first CHG shower.  You may shave your face/neck. Please follow these instructions carefully:  1.  Shower with CHG Soap the night before surgery and the  morning of  Surgery.  2.  If you choose to wash your hair, wash your hair first as usual with your  normal  shampoo.  3.  After you shampoo, rinse your hair and body thoroughly to remove the  shampoo.                           4.  Use CHG as you would any other liquid soap.  You can apply chg directly  to the skin and wash                       Gently with a scrungie or clean washcloth.  5.  Apply the CHG Soap to your body ONLY FROM THE NECK DOWN.   Do not use on face/ open                           Wound or open sores. Avoid contact with eyes, ears mouth and genitals (private parts).                       Wash face,  Genitals (private parts) with your normal soap.             6.  Wash thoroughly, paying special attention to the area where your surgery  will be performed.  7.  Thoroughly rinse your body with warm water from the neck down.  8.  DO NOT shower/wash with your normal soap after using and rinsing off  the CHG Soap.                9.  Pat yourself dry with a clean towel.            10.  Wear clean pajamas.            11.  Place clean sheets on your bed the night of your first shower and do not  sleep with pets. Day of Surgery : Do not apply any lotions/deodorants the morning of surgery.  Please wear clean clothes to the hospital/surgery center.  FAILURE TO FOLLOW THESE INSTRUCTIONS MAY RESULT IN THE CANCELLATION OF YOUR SURGERY PATIENT SIGNATURE_________________________________  NURSE SIGNATURE__________________________________  ________________________________________________________________________  WHAT IS A BLOOD TRANSFUSION? Blood Transfusion Information  A transfusion is the replacement of blood or some of its parts. Blood is made up of multiple cells which provide different functions.  Red blood cells carry oxygen and are used for blood loss replacement.  White blood cells fight against infection.  Platelets control bleeding.  Plasma helps clot blood.  Other blood products are  available for specialized needs, such as hemophilia or other clotting disorders. BEFORE THE TRANSFUSION  Who gives blood for transfusions?   Healthy volunteers who are fully evaluated to make sure their blood is safe. This is blood bank blood. Transfusion therapy is the safest it has ever been in the practice of medicine. Before blood is taken from a donor, a complete history is taken to make sure that person has no history of diseases nor engages in risky social behavior (examples are intravenous drug use or sexual activity with multiple partners). The donor's travel history is screened to minimize risk of  transmitting infections, such as malaria. The donated blood is tested for signs of infectious diseases, such as HIV and hepatitis. The blood is then tested to be sure it is compatible with you in order to minimize the chance of a transfusion reaction. If you or a relative donates blood, this is often done in anticipation of surgery and is not appropriate for emergency situations. It takes many days to process the donated blood. RISKS AND COMPLICATIONS Although transfusion therapy is very safe and saves many lives, the main dangers of transfusion include:   Getting an infectious disease.  Developing a transfusion reaction. This is an allergic reaction to something in the blood you were given. Every precaution is taken to prevent this. The decision to have a blood transfusion has been considered carefully by your caregiver before blood is given. Blood is not given unless the benefits outweigh the risks. AFTER THE TRANSFUSION  Right after receiving a blood transfusion, you will usually feel much better and more energetic. This is especially true if your red blood cells have gotten low (anemic). The transfusion raises the level of the red blood cells which carry oxygen, and this usually causes an energy increase.  The nurse administering the transfusion will monitor you carefully for  complications. HOME CARE INSTRUCTIONS  No special instructions are needed after a transfusion. You may find your energy is better. Speak with your caregiver about any limitations on activity for underlying diseases you may have. SEEK MEDICAL CARE IF:   Your condition is not improving after your transfusion.  You develop redness or irritation at the intravenous (IV) site. SEEK IMMEDIATE MEDICAL CARE IF:  Any of the following symptoms occur over the next 12 hours:  Shaking chills.  You have a temperature by mouth above 102 F (38.9 C), not controlled by medicine.  Chest, back, or muscle pain.  People around you feel you are not acting correctly or are confused.  Shortness of breath or difficulty breathing.  Dizziness and fainting.  You get a rash or develop hives.  You have a decrease in urine output.  Your urine turns a dark color or changes to pink, red, or brown. Any of the following symptoms occur over the next 10 days:  You have a temperature by mouth above 102 F (38.9 C), not controlled by medicine.  Shortness of breath.  Weakness after normal activity.  The white part of the eye turns yellow (jaundice).  You have a decrease in the amount of urine or are urinating less often.  Your urine turns a dark color or changes to pink, red, or brown. Document Released: 01/10/2000 Document Revised: 04/06/2011 Document Reviewed: 08/29/2007 ExitCare Patient Information 2014 [place].  _______________________________________________________________________  Incentive Spirometer  An incentive spirometer is a tool that can help keep your lungs clear and active. This tool measures how well you are filling your lungs with each breath. Taking long deep breaths may help reverse or decrease the chance of developing breathing (pulmonary) problems (especially infection) following:  A long period of time when you are unable to move or be active. BEFORE THE PROCEDURE   If  the spirometer includes an indicator to show your best effort, your nurse or respiratory therapist will set it to a desired goal.  If possible, sit up straight or lean slightly forward. Try not to slouch.  Hold the incentive spirometer in an upright position. INSTRUCTIONS FOR USE  1. Sit on the edge of your bed if possible, or sit up as  far as you can in bed or on a chair. 2. Hold the incentive spirometer in an upright position. 3. Breathe out normally. 4. Place the mouthpiece in your mouth and seal your lips tightly around it. 5. Breathe in slowly and as deeply as possible, raising the piston or the ball toward the top of the column. 6. Hold your breath for 3-5 seconds or for as long as possible. Allow the piston or ball to fall to the bottom of the column. 7. Remove the mouthpiece from your mouth and breathe out normally. 8. Rest for a few seconds and repeat Steps 1 through 7 at least 10 times every 1-2 hours when you are awake. Take your time and take a few normal breaths between deep breaths. 9. The spirometer may include an indicator to show your best effort. Use the indicator as a goal to work toward during each repetition. 10. After each set of 10 deep breaths, practice coughing to be sure your lungs are clear. If you have an incision (the cut made at the time of surgery), support your incision when coughing by placing a pillow or rolled up towels firmly against it. Once you are able to get out of bed, walk around indoors and cough well. You may stop using the incentive spirometer when instructed by your caregiver.  RISKS AND COMPLICATIONS  Take your time so you do not get dizzy or light-headed.  If you are in pain, you may need to take or ask for pain medication before doing incentive spirometry. It is harder to take a deep breath if you are having pain. AFTER USE  Rest and breathe slowly and easily.  It can be helpful to keep track of a log of your progress. Your caregiver can  provide you with a simple table to help with this. If you are using the spirometer at home, follow these instructions: [place] IF:   You are having difficultly using the spirometer.  You have trouble using the spirometer as often as instructed.  Your pain medication is not giving enough relief while using the spirometer.  You develop fever of 100.5 F (38.1 C) or higher. SEEK IMMEDIATE MEDICAL CARE IF:   You cough up bloody sputum that had not been present before.  You develop fever of 102 F (38.9 C) or greater.  You develop worsening pain at or near the incision site. MAKE SURE YOU:   Understand these instructions.  Will watch your condition.  Will get help right away if you are not doing well or get worse. Document Released: 05/25/2006 Document Revised: 04/06/2011 Document Reviewed: 07/26/2006 [facility] Patient Information 2014 [place], [place].   ________________________________________________________________________

## 2017-07-13 ENCOUNTER — Encounter (HOSPITAL_COMMUNITY): Payer: Self-pay

## 2017-07-13 ENCOUNTER — Encounter (HOSPITAL_COMMUNITY)
Admission: RE | Admit: 2017-07-13 | Discharge: 2017-07-13 | Disposition: A | Discharge status: Home / Self Care | Payer: Medicare Other | Source: Ambulatory Visit | Attending: Surgery | Admitting: Surgery

## 2017-07-13 ENCOUNTER — Other Ambulatory Visit: Payer: Self-pay

## 2017-07-13 DIAGNOSIS — D125 Benign neoplasm of sigmoid colon: Secondary | ICD-10-CM | POA: Diagnosis not present

## 2017-07-13 DIAGNOSIS — K635 Polyp of colon: Secondary | ICD-10-CM | POA: Insufficient documentation

## 2017-07-13 DIAGNOSIS — F17210 Nicotine dependence, cigarettes, uncomplicated: Secondary | ICD-10-CM | POA: Diagnosis not present

## 2017-07-13 DIAGNOSIS — D122 Benign neoplasm of ascending colon: Secondary | ICD-10-CM | POA: Diagnosis not present

## 2017-07-13 DIAGNOSIS — K429 Umbilical hernia without obstruction or gangrene: Secondary | ICD-10-CM | POA: Diagnosis not present

## 2017-07-13 DIAGNOSIS — R001 Bradycardia, unspecified: Secondary | ICD-10-CM | POA: Insufficient documentation

## 2017-07-13 DIAGNOSIS — Z01818 Encounter for other preprocedural examination: Secondary | ICD-10-CM | POA: Insufficient documentation

## 2017-07-13 DIAGNOSIS — D124 Benign neoplasm of descending colon: Secondary | ICD-10-CM | POA: Diagnosis not present

## 2017-07-13 DIAGNOSIS — Z85828 Personal history of other malignant neoplasm of skin: Secondary | ICD-10-CM | POA: Diagnosis not present

## 2017-07-13 DIAGNOSIS — Z01810 Encounter for preprocedural cardiovascular examination: Secondary | ICD-10-CM

## 2017-07-13 DIAGNOSIS — D120 Benign neoplasm of cecum: Secondary | ICD-10-CM | POA: Diagnosis not present

## 2017-07-13 DIAGNOSIS — E785 Hyperlipidemia, unspecified: Secondary | ICD-10-CM | POA: Diagnosis not present

## 2017-07-13 DIAGNOSIS — D123 Benign neoplasm of transverse colon: Secondary | ICD-10-CM | POA: Diagnosis not present

## 2017-07-13 DIAGNOSIS — Z833 Family history of diabetes mellitus: Secondary | ICD-10-CM | POA: Insufficient documentation

## 2017-07-13 DIAGNOSIS — Z800 Family history of malignant neoplasm of digestive organs: Secondary | ICD-10-CM | POA: Diagnosis not present

## 2017-07-13 HISTORY — DX: Headache: R51

## 2017-07-13 HISTORY — DX: Cardiac murmur, unspecified: R01.1

## 2017-07-13 HISTORY — DX: Headache, unspecified: R51.9

## 2017-07-13 HISTORY — DX: Unspecified osteoarthritis, unspecified site: M19.90

## 2017-07-13 HISTORY — DX: Malignant (primary) neoplasm, unspecified: C80.1

## 2017-07-13 HISTORY — DX: Cardiac arrhythmia, unspecified: I49.9

## 2017-07-13 LAB — CBC WITH DIFFERENTIAL/PLATELET
BASOS ABS: 0.1 10*3/uL (ref 0.0–0.1)
Basophils Relative: 1 %
EOS PCT: 2 %
Eosinophils Absolute: 0.1 10*3/uL (ref 0.0–0.7)
HEMATOCRIT: 45.1 % (ref 36.0–46.0)
Hemoglobin: 14.9 g/dL (ref 12.0–15.0)
LYMPHS ABS: 2.1 10*3/uL (ref 0.7–4.0)
LYMPHS PCT: 33 %
MCH: 29.6 pg (ref 26.0–34.0)
MCHC: 33 g/dL (ref 30.0–36.0)
MCV: 89.7 fL (ref 78.0–100.0)
MONO ABS: 0.4 10*3/uL (ref 0.1–1.0)
MONOS PCT: 7 %
NEUTROS ABS: 3.8 10*3/uL (ref 1.7–7.7)
Neutrophils Relative %: 57 %
PLATELETS: 170 10*3/uL (ref 150–400)
RBC: 5.03 MIL/uL (ref 3.87–5.11)
RDW: 14.8 % (ref 11.5–15.5)
WBC: 6.5 10*3/uL (ref 4.0–10.5)

## 2017-07-13 LAB — COMPREHENSIVE METABOLIC PANEL
ALT: 20 U/L (ref 14–54)
ANION GAP: 9 (ref 5–15)
AST: 25 U/L (ref 15–41)
Albumin: 4.4 g/dL (ref 3.5–5.0)
Alkaline Phosphatase: 74 U/L (ref 38–126)
BUN: 13 mg/dL (ref 6–20)
CHLORIDE: 107 mmol/L (ref 101–111)
CO2: 24 mmol/L (ref 22–32)
Calcium: 9.2 mg/dL (ref 8.9–10.3)
Creatinine, Ser: 0.8 mg/dL (ref 0.44–1.00)
Glucose, Bld: 85 mg/dL (ref 65–99)
POTASSIUM: 4.2 mmol/L (ref 3.5–5.1)
Sodium: 140 mmol/L (ref 135–145)
Total Bilirubin: 0.7 mg/dL (ref 0.3–1.2)
Total Protein: 6.9 g/dL (ref 6.5–8.1)

## 2017-07-13 LAB — ABO/RH: ABO/RH(D): O POS

## 2017-07-13 LAB — HEMOGLOBIN A1C
HEMOGLOBIN A1C: 5 % (ref 4.8–5.6)
MEAN PLASMA GLUCOSE: 96.8 mg/dL

## 2017-07-16 ENCOUNTER — Encounter (HOSPITAL_COMMUNITY): Payer: Self-pay | Admitting: *Deleted

## 2017-07-16 ENCOUNTER — Encounter (HOSPITAL_COMMUNITY)
Admission: RE | Disposition: A | Discharge status: Home / Self Care | Payer: Self-pay | Source: Ambulatory Visit | Attending: Surgery

## 2017-07-16 ENCOUNTER — Inpatient Hospital Stay (HOSPITAL_COMMUNITY)
Admission: RE | Admit: 2017-07-16 | Discharge: 2017-07-19 | DRG: 331 | Disposition: A | Discharge status: Home / Self Care | Payer: Medicare Other | Source: Ambulatory Visit | Attending: Surgery | Admitting: Surgery

## 2017-07-16 ENCOUNTER — Inpatient Hospital Stay (HOSPITAL_COMMUNITY): Payer: Medicare Other | Admitting: Certified Registered Nurse Anesthetist

## 2017-07-16 ENCOUNTER — Other Ambulatory Visit: Payer: Self-pay

## 2017-07-16 DIAGNOSIS — D122 Benign neoplasm of ascending colon: Secondary | ICD-10-CM | POA: Diagnosis present

## 2017-07-16 DIAGNOSIS — E785 Hyperlipidemia, unspecified: Secondary | ICD-10-CM | POA: Diagnosis not present

## 2017-07-16 DIAGNOSIS — D120 Benign neoplasm of cecum: Secondary | ICD-10-CM | POA: Diagnosis present

## 2017-07-16 DIAGNOSIS — D123 Benign neoplasm of transverse colon: Secondary | ICD-10-CM | POA: Diagnosis not present

## 2017-07-16 DIAGNOSIS — Z800 Family history of malignant neoplasm of digestive organs: Secondary | ICD-10-CM

## 2017-07-16 DIAGNOSIS — F17210 Nicotine dependence, cigarettes, uncomplicated: Secondary | ICD-10-CM | POA: Diagnosis present

## 2017-07-16 DIAGNOSIS — K635 Polyp of colon: Secondary | ICD-10-CM | POA: Diagnosis not present

## 2017-07-16 DIAGNOSIS — F17200 Nicotine dependence, unspecified, uncomplicated: Secondary | ICD-10-CM | POA: Diagnosis present

## 2017-07-16 DIAGNOSIS — Z720 Tobacco use: Secondary | ICD-10-CM | POA: Diagnosis present

## 2017-07-16 DIAGNOSIS — Z85828 Personal history of other malignant neoplasm of skin: Secondary | ICD-10-CM

## 2017-07-16 DIAGNOSIS — Z1211 Encounter for screening for malignant neoplasm of colon: Secondary | ICD-10-CM | POA: Diagnosis not present

## 2017-07-16 DIAGNOSIS — D124 Benign neoplasm of descending colon: Secondary | ICD-10-CM | POA: Diagnosis present

## 2017-07-16 DIAGNOSIS — M1990 Unspecified osteoarthritis, unspecified site: Secondary | ICD-10-CM | POA: Diagnosis present

## 2017-07-16 DIAGNOSIS — K429 Umbilical hernia without obstruction or gangrene: Secondary | ICD-10-CM | POA: Diagnosis not present

## 2017-07-16 DIAGNOSIS — D125 Benign neoplasm of sigmoid colon: Secondary | ICD-10-CM | POA: Diagnosis not present

## 2017-07-16 HISTORY — DX: Hyperlipidemia, unspecified: E78.5

## 2017-07-16 HISTORY — PX: LAPAROSCOPIC RIGHT HEMI COLECTOMY: SHX5926

## 2017-07-16 LAB — TYPE AND SCREEN
ABO/RH(D): O POS
ANTIBODY SCREEN: NEGATIVE

## 2017-07-16 LAB — MAGNESIUM: MAGNESIUM: 1.9 mg/dL (ref 1.7–2.4)

## 2017-07-16 SURGERY — LAPAROSCOPIC RIGHT HEMI COLECTOMY
Anesthesia: General | Site: Abdomen | Laterality: Right

## 2017-07-16 MED ORDER — HYDROMORPHONE HCL 1 MG/ML IJ SOLN: 0.5000 mg | INTRAMUSCULAR | Status: DC | PRN

## 2017-07-16 MED ORDER — SODIUM CHLORIDE 0.9 % IJ SOLN
INTRAMUSCULAR | Status: AC
  Filled 2017-07-16: qty 10

## 2017-07-16 MED ORDER — FENTANYL CITRATE (PF) 250 MCG/5ML IJ SOLN
INTRAMUSCULAR | Status: DC | PRN
  Administered 2017-07-16: 50 ug via INTRAVENOUS

## 2017-07-16 MED ORDER — ACETAMINOPHEN 500 MG PO TABS
1000.0000 mg | ORAL_TABLET | Freq: Four times a day (QID) | ORAL | Status: AC
  Administered 2017-07-16 – 2017-07-17 (×4): 1000 mg via ORAL
  Filled 2017-07-16 (×4): qty 2

## 2017-07-16 MED ORDER — NEOMYCIN SULFATE 500 MG PO TABS: 1000.0000 mg | ORAL_TABLET | ORAL | Status: DC

## 2017-07-16 MED ORDER — ENSURE SURGERY PO LIQD
237.0000 mL | Freq: Two times a day (BID) | ORAL | Status: DC
  Administered 2017-07-16 – 2017-07-18 (×5): 237 mL via ORAL
  Filled 2017-07-16 (×7): qty 237

## 2017-07-16 MED ORDER — DIPHENHYDRAMINE HCL 50 MG/ML IJ SOLN
INTRAMUSCULAR | Status: DC | PRN
  Administered 2017-07-16: 12.5 mg via INTRAVENOUS

## 2017-07-16 MED ORDER — PROPOFOL 10 MG/ML IV BOLUS
INTRAVENOUS | Status: AC
  Filled 2017-07-16: qty 20

## 2017-07-16 MED ORDER — GABAPENTIN 300 MG PO CAPS
300.0000 mg | ORAL_CAPSULE | ORAL | Status: AC
  Administered 2017-07-16: 300 mg via ORAL
  Filled 2017-07-16: qty 1

## 2017-07-16 MED ORDER — BUPIVACAINE LIPOSOME 1.3 % IJ SUSP
INTRAMUSCULAR | Status: DC | PRN
  Administered 2017-07-16: 20 mL

## 2017-07-16 MED ORDER — EPHEDRINE 5 MG/ML INJ
INTRAVENOUS | Status: AC
  Filled 2017-07-16: qty 10

## 2017-07-16 MED ORDER — IBUPROFEN 400 MG PO TABS
600.0000 mg | ORAL_TABLET | Freq: Four times a day (QID) | ORAL | Status: DC
  Administered 2017-07-16 – 2017-07-18 (×8): 600 mg via ORAL
  Filled 2017-07-16: qty 3
  Filled 2017-07-16 (×7): qty 1

## 2017-07-16 MED ORDER — ALVIMOPAN 12 MG PO CAPS
12.0000 mg | ORAL_CAPSULE | ORAL | Status: AC
  Administered 2017-07-16: 12 mg via ORAL
  Filled 2017-07-16: qty 1

## 2017-07-16 MED ORDER — FENTANYL CITRATE (PF) 250 MCG/5ML IJ SOLN
INTRAMUSCULAR | Status: AC
  Filled 2017-07-16: qty 5

## 2017-07-16 MED ORDER — ALUM & MAG HYDROXIDE-SIMETH 200-200-20 MG/5ML PO SUSP: 30.0000 mL | Freq: Four times a day (QID) | ORAL | Status: DC | PRN

## 2017-07-16 MED ORDER — SODIUM CHLORIDE 0.9 % IV SOLN
2.0000 g | INTRAVENOUS | Status: AC
  Administered 2017-07-16: 2 g via INTRAVENOUS
  Filled 2017-07-16: qty 2

## 2017-07-16 MED ORDER — ROCURONIUM BROMIDE 100 MG/10ML IV SOLN
INTRAVENOUS | Status: AC
  Filled 2017-07-16: qty 1

## 2017-07-16 MED ORDER — MIDAZOLAM HCL 2 MG/2ML IJ SOLN
INTRAMUSCULAR | Status: AC
  Filled 2017-07-16: qty 2

## 2017-07-16 MED ORDER — CELECOXIB 200 MG PO CAPS
200.0000 mg | ORAL_CAPSULE | ORAL | Status: AC
  Administered 2017-07-16: 200 mg via ORAL
  Filled 2017-07-16: qty 1

## 2017-07-16 MED ORDER — MIDAZOLAM HCL 5 MG/5ML IJ SOLN
INTRAMUSCULAR | Status: DC | PRN
  Administered 2017-07-16: 2 mg via INTRAVENOUS

## 2017-07-16 MED ORDER — SODIUM CHLORIDE 0.9 % IR SOLN
Status: DC | PRN
  Administered 2017-07-16: 2000 mL

## 2017-07-16 MED ORDER — BUPIVACAINE LIPOSOME 1.3 % IJ SUSP
20.0000 mL | Freq: Once | INTRAMUSCULAR | Status: DC
  Filled 2017-07-16: qty 20

## 2017-07-16 MED ORDER — LIDOCAINE 2% (20 MG/ML) 5 ML SYRINGE
INTRAMUSCULAR | Status: AC
  Filled 2017-07-16: qty 5

## 2017-07-16 MED ORDER — SUGAMMADEX SODIUM 200 MG/2ML IV SOLN
INTRAVENOUS | Status: DC | PRN
  Administered 2017-07-16: 300 mg via INTRAVENOUS

## 2017-07-16 MED ORDER — PHENYLEPHRINE 40 MCG/ML (10ML) SYRINGE FOR IV PUSH (FOR BLOOD PRESSURE SUPPORT)
PREFILLED_SYRINGE | INTRAVENOUS | Status: DC | PRN
  Administered 2017-07-16 (×6): 80 ug via INTRAVENOUS

## 2017-07-16 MED ORDER — LACTATED RINGERS IV SOLN
INTRAVENOUS | Status: DC
  Administered 2017-07-16 – 2017-07-17 (×3): via INTRAVENOUS

## 2017-07-16 MED ORDER — KETAMINE HCL 10 MG/ML IJ SOLN
INTRAMUSCULAR | Status: AC
  Filled 2017-07-16: qty 1

## 2017-07-16 MED ORDER — PHENYLEPHRINE 40 MCG/ML (10ML) SYRINGE FOR IV PUSH (FOR BLOOD PRESSURE SUPPORT)
PREFILLED_SYRINGE | INTRAVENOUS | Status: AC
  Filled 2017-07-16: qty 10

## 2017-07-16 MED ORDER — FENTANYL CITRATE (PF) 100 MCG/2ML IJ SOLN
25.0000 ug | INTRAMUSCULAR | Status: DC | PRN
Start: 2017-07-16 — End: 2017-07-16
  Administered 2017-07-16: 50 ug via INTRAVENOUS

## 2017-07-16 MED ORDER — LIDOCAINE 2% (20 MG/ML) 5 ML SYRINGE
INTRAMUSCULAR | Status: DC | PRN
  Administered 2017-07-16: 1.5 mg/kg/h via INTRAVENOUS

## 2017-07-16 MED ORDER — KETAMINE HCL 10 MG/ML IJ SOLN
INTRAMUSCULAR | Status: DC | PRN
  Administered 2017-07-16: 30 mg via INTRAVENOUS

## 2017-07-16 MED ORDER — SODIUM CHLORIDE 0.9 % IJ SOLN
INTRAMUSCULAR | Status: DC | PRN
  Administered 2017-07-16: 15 mL

## 2017-07-16 MED ORDER — HEPARIN SODIUM (PORCINE) 5000 UNIT/ML IJ SOLN
5000.0000 [IU] | Freq: Once | INTRAMUSCULAR | Status: AC
  Administered 2017-07-16: 5000 [IU] via SUBCUTANEOUS
  Filled 2017-07-16: qty 1

## 2017-07-16 MED ORDER — HEPARIN SODIUM (PORCINE) 5000 UNIT/ML IJ SOLN
5000.0000 [IU] | Freq: Three times a day (TID) | INTRAMUSCULAR | Status: DC
  Administered 2017-07-16 – 2017-07-19 (×9): 5000 [IU] via SUBCUTANEOUS
  Filled 2017-07-16 (×10): qty 1

## 2017-07-16 MED ORDER — ONDANSETRON HCL 4 MG/2ML IJ SOLN
INTRAMUSCULAR | Status: AC
  Filled 2017-07-16: qty 2

## 2017-07-16 MED ORDER — ONDANSETRON HCL 4 MG/2ML IJ SOLN: 4.0000 mg | Freq: Once | INTRAMUSCULAR | Status: DC | PRN

## 2017-07-16 MED ORDER — DIPHENHYDRAMINE HCL 50 MG/ML IJ SOLN: 12.5000 mg | Freq: Four times a day (QID) | INTRAMUSCULAR | Status: DC | PRN

## 2017-07-16 MED ORDER — CHLORHEXIDINE GLUCONATE CLOTH 2 % EX PADS: 6.0000 | MEDICATED_PAD | Freq: Once | CUTANEOUS | Status: DC

## 2017-07-16 MED ORDER — EPHEDRINE SULFATE-NACL 50-0.9 MG/10ML-% IV SOSY
PREFILLED_SYRINGE | INTRAVENOUS | Status: DC | PRN
  Administered 2017-07-16: 10 mg via INTRAVENOUS

## 2017-07-16 MED ORDER — LACTATED RINGERS IV SOLN
INTRAVENOUS | Status: DC | PRN
  Administered 2017-07-16: 07:00:00 via INTRAVENOUS

## 2017-07-16 MED ORDER — ONDANSETRON HCL 4 MG/2ML IJ SOLN
INTRAMUSCULAR | Status: DC | PRN
  Administered 2017-07-16: 4 mg via INTRAVENOUS

## 2017-07-16 MED ORDER — TRAMADOL HCL 50 MG PO TABS
50.0000 mg | ORAL_TABLET | Freq: Four times a day (QID) | ORAL | Status: DC | PRN
  Administered 2017-07-17 – 2017-07-18 (×2): 50 mg via ORAL
  Filled 2017-07-16 (×2): qty 1

## 2017-07-16 MED ORDER — LIDOCAINE 2% (20 MG/ML) 5 ML SYRINGE
INTRAMUSCULAR | Status: DC | PRN
  Administered 2017-07-16: 40 mg via INTRAVENOUS

## 2017-07-16 MED ORDER — METRONIDAZOLE 500 MG PO TABS: 1000.0000 mg | ORAL_TABLET | ORAL | Status: DC

## 2017-07-16 MED ORDER — ATORVASTATIN CALCIUM 10 MG PO TABS
10.0000 mg | ORAL_TABLET | Freq: Every day | ORAL | Status: DC
  Administered 2017-07-16 – 2017-07-18 (×3): 10 mg via ORAL
  Filled 2017-07-16 (×5): qty 1

## 2017-07-16 MED ORDER — LIDOCAINE HCL 2 % IJ SOLN
INTRAMUSCULAR | Status: AC
  Filled 2017-07-16: qty 20

## 2017-07-16 MED ORDER — ONDANSETRON HCL 4 MG/2ML IJ SOLN: 4.0000 mg | Freq: Four times a day (QID) | INTRAMUSCULAR | Status: DC | PRN

## 2017-07-16 MED ORDER — PROPOFOL 500 MG/50ML IV EMUL
INTRAVENOUS | Status: DC | PRN
  Administered 2017-07-16: 25 ug/kg/min via INTRAVENOUS

## 2017-07-16 MED ORDER — PROPOFOL 10 MG/ML IV BOLUS
INTRAVENOUS | Status: DC | PRN
  Administered 2017-07-16: 80 mg via INTRAVENOUS

## 2017-07-16 MED ORDER — NICOTINE 7 MG/24HR TD PT24
7.0000 mg | MEDICATED_PATCH | Freq: Every day | TRANSDERMAL | Status: DC
  Administered 2017-07-16 – 2017-07-19 (×4): 7 mg via TRANSDERMAL
  Filled 2017-07-16 (×4): qty 1

## 2017-07-16 MED ORDER — DEXAMETHASONE SODIUM PHOSPHATE 10 MG/ML IJ SOLN
INTRAMUSCULAR | Status: AC
  Filled 2017-07-16: qty 1

## 2017-07-16 MED ORDER — ALVIMOPAN 12 MG PO CAPS
12.0000 mg | ORAL_CAPSULE | Freq: Two times a day (BID) | ORAL | Status: DC
  Administered 2017-07-17: 12 mg via ORAL
  Filled 2017-07-16 (×3): qty 1

## 2017-07-16 MED ORDER — ACETAMINOPHEN 500 MG PO TABS
1000.0000 mg | ORAL_TABLET | ORAL | Status: AC
  Administered 2017-07-16: 1000 mg via ORAL
  Filled 2017-07-16: qty 2

## 2017-07-16 MED ORDER — FENTANYL CITRATE (PF) 100 MCG/2ML IJ SOLN
INTRAMUSCULAR | Status: AC
  Administered 2017-07-16: 50 ug via INTRAVENOUS
  Filled 2017-07-16: qty 2

## 2017-07-16 MED ORDER — BUPIVACAINE-EPINEPHRINE (PF) 0.5% -1:200000 IJ SOLN
INTRAMUSCULAR | Status: DC | PRN
  Administered 2017-07-16: 15 mL

## 2017-07-16 MED ORDER — DEXAMETHASONE SODIUM PHOSPHATE 10 MG/ML IJ SOLN
INTRAMUSCULAR | Status: DC | PRN
  Administered 2017-07-16: 10 mg via INTRAVENOUS

## 2017-07-16 MED ORDER — ROCURONIUM BROMIDE 10 MG/ML (PF) SYRINGE
PREFILLED_SYRINGE | INTRAVENOUS | Status: DC | PRN
  Administered 2017-07-16: 10 mg via INTRAVENOUS
  Administered 2017-07-16: 50 mg via INTRAVENOUS
  Administered 2017-07-16: 20 mg via INTRAVENOUS

## 2017-07-16 MED ORDER — HYDRALAZINE HCL 20 MG/ML IJ SOLN: 10.0000 mg | INTRAMUSCULAR | Status: DC | PRN

## 2017-07-16 MED ORDER — DIPHENHYDRAMINE HCL 12.5 MG/5ML PO ELIX: 12.5000 mg | ORAL_SOLUTION | Freq: Four times a day (QID) | ORAL | Status: DC | PRN

## 2017-07-16 MED ORDER — SUGAMMADEX SODIUM 200 MG/2ML IV SOLN
INTRAVENOUS | Status: AC
  Filled 2017-07-16: qty 4

## 2017-07-16 MED ORDER — ONDANSETRON HCL 4 MG PO TABS: 4.0000 mg | ORAL_TABLET | Freq: Four times a day (QID) | ORAL | Status: DC | PRN

## 2017-07-16 MED ORDER — BUPIVACAINE-EPINEPHRINE (PF) 0.5% -1:200000 IJ SOLN
INTRAMUSCULAR | Status: AC
  Filled 2017-07-16: qty 30

## 2017-07-16 MED ORDER — LACTATED RINGERS IR SOLN
Status: DC | PRN
  Administered 2017-07-16: 1000 mL

## 2017-07-16 MED ORDER — SCOPOLAMINE 1 MG/3DAYS TD PT72
1.0000 | MEDICATED_PATCH | Freq: Once | TRANSDERMAL | Status: AC
  Administered 2017-07-16: 1 via TRANSDERMAL
  Filled 2017-07-16: qty 1

## 2017-07-16 MED ORDER — DIPHENHYDRAMINE HCL 50 MG/ML IJ SOLN
INTRAMUSCULAR | Status: AC
  Filled 2017-07-16: qty 1

## 2017-07-16 MED ORDER — SACCHAROMYCES BOULARDII 250 MG PO CAPS
250.0000 mg | ORAL_CAPSULE | Freq: Two times a day (BID) | ORAL | Status: DC
  Administered 2017-07-16 – 2017-07-19 (×6): 250 mg via ORAL
  Filled 2017-07-16 (×6): qty 1

## 2017-07-16 MED ORDER — POLYETHYLENE GLYCOL 3350 17 GM/SCOOP PO POWD: 1.0000 | Freq: Once | ORAL | Status: DC

## 2017-07-16 SURGICAL SUPPLY — 55 items
APPLIER CLIP ROT 10 11.4 M/L (STAPLE)
BLADE CLIPPER SURG (BLADE) IMPLANT
CABLE HIGH FREQUENCY MONO STRZ (ELECTRODE) ×6 IMPLANT
CELLS DAT CNTRL 66122 CELL SVR (MISCELLANEOUS) IMPLANT
CHLORAPREP W/TINT 26ML (MISCELLANEOUS) ×3 IMPLANT
CLIP APPLIE ROT 10 11.4 M/L (STAPLE) IMPLANT
DECANTER SPIKE VIAL GLASS SM (MISCELLANEOUS) ×3 IMPLANT
DERMABOND ADVANCED (GAUZE/BANDAGES/DRESSINGS) ×2
DERMABOND ADVANCED .7 DNX12 (GAUZE/BANDAGES/DRESSINGS) ×1 IMPLANT
DISSECTOR BLUNT TIP ENDO 5MM (MISCELLANEOUS) IMPLANT
DRSG OPSITE POSTOP 4X6 (GAUZE/BANDAGES/DRESSINGS) ×3 IMPLANT
ELECT REM PT RETURN 15FT ADLT (MISCELLANEOUS) ×3 IMPLANT
GAUZE SPONGE 4X4 12PLY STRL (GAUZE/BANDAGES/DRESSINGS) ×3 IMPLANT
GLOVE BIO SURGEON STRL SZ7.5 (GLOVE) ×6 IMPLANT
GLOVE ECLIPSE 8.0 STRL XLNG CF (GLOVE) ×6 IMPLANT
GOWN STRL REUS W/TWL XL LVL3 (GOWN DISPOSABLE) ×12 IMPLANT
LIGASURE IMPACT 36 18CM CVD LR (INSTRUMENTS) IMPLANT
NS IRRIG 1000ML POUR BTL (IV SOLUTION) ×3 IMPLANT
PACK COLON (CUSTOM PROCEDURE TRAY) ×3 IMPLANT
PAD POSITIONING PINK XL (MISCELLANEOUS) IMPLANT
POSITIONER SURGICAL ARM (MISCELLANEOUS) IMPLANT
RELOAD AUTO 90-3.5 TA90 BLE (ENDOMECHANICALS) ×3 IMPLANT
RELOAD PROXIMATE 75MM BLUE (ENDOMECHANICALS) ×6 IMPLANT
RTRCTR WOUND ALEXIS 18CM MED (MISCELLANEOUS)
SCISSORS LAP 5X35 DISP (ENDOMECHANICALS) ×3 IMPLANT
SEALER TISSUE G2 STRG ARTC 35C (ENDOMECHANICALS) IMPLANT
SET IRRIG TUBING LAPAROSCOPIC (IRRIGATION / IRRIGATOR) IMPLANT
SHEARS HARMONIC ACE PLUS 36CM (ENDOMECHANICALS) IMPLANT
SLEEVE ADV FIXATION 5X100MM (TROCAR) ×6 IMPLANT
SLEEVE ENDOPATH XCEL 5M (ENDOMECHANICALS) ×3 IMPLANT
STAPLER PROXIMATE 75MM BLUE (STAPLE) ×3 IMPLANT
STAPLER VISISTAT 35W (STAPLE) ×3 IMPLANT
SUT MNCRL AB 4-0 PS2 18 (SUTURE) ×3 IMPLANT
SUT PDS AB 1 CT1 27 (SUTURE) ×6 IMPLANT
SUT PROLENE 2 0 CT2 30 (SUTURE) IMPLANT
SUT PROLENE 2 0 KS (SUTURE) IMPLANT
SUT SILK 2 0 (SUTURE) ×2
SUT SILK 2 0 SH CR/8 (SUTURE) ×3 IMPLANT
SUT SILK 2-0 18XBRD TIE 12 (SUTURE) ×1 IMPLANT
SUT SILK 3 0 (SUTURE) ×2
SUT SILK 3 0 SH CR/8 (SUTURE) ×3 IMPLANT
SUT SILK 3-0 18XBRD TIE 12 (SUTURE) ×1 IMPLANT
SYS LAPSCP GELPORT 120MM (MISCELLANEOUS)
SYSTEM LAPSCP GELPORT 120MM (MISCELLANEOUS) IMPLANT
TAPE CLOTH 4X10 WHT NS (GAUZE/BANDAGES/DRESSINGS) IMPLANT
TOWEL OR 17X26 10 PK STRL BLUE (TOWEL DISPOSABLE) ×3 IMPLANT
TRAY FOLEY MTR SLVR 14FR STAT (SET/KITS/TRAYS/PACK) ×3 IMPLANT
TRAY FOLEY MTR SLVR 16FR STAT (SET/KITS/TRAYS/PACK) IMPLANT
TROCAR ADV FIXATION 5X100MM (TROCAR) ×3 IMPLANT
TROCAR BALLN 12MMX100 BLUNT (TROCAR) ×3 IMPLANT
TROCAR XCEL NON-BLD 11X100MML (ENDOMECHANICALS) IMPLANT
TUBING CONNECTING 10 (TUBING) ×4 IMPLANT
TUBING CONNECTING 10' (TUBING) ×2
TUBING INSUF HEATED (TUBING) ×3 IMPLANT
YANKAUER SUCT BULB TIP NO VENT (SUCTIONS) ×3 IMPLANT

## 2017-07-16 NOTE — Transfer of Care (Signed)
Immediate Anesthesia Transfer of Care Note  Patient: Jennifer [name]  Procedure(s) Performed: LAPAROSCOPIC RIGHT HEMI COLECTOMY (Right Abdomen)  Patient Location: PACU  Anesthesia Type:General  Level of Consciousness: awake, alert , oriented and patient cooperative  Airway & Oxygen Therapy: Patient Spontanous Breathing and Patient connected to face mask oxygen  Post-op Assessment: Report given to RN, Post -op Vital signs reviewed and stable and Patient moving all extremities  Post vital signs: Reviewed and stable  Last Vitals:  Vitals Value Taken Time  BP 124/65 07/16/2017 10:00 AM  Temp 36.5 C 07/16/2017 10:00 AM  Pulse 67 07/16/2017 10:03 AM  Resp 33 07/16/2017 10:03 AM  SpO2 100 % 07/16/2017 10:03 AM  Vitals shown include unvalidated device data.  Last Pain:  Vitals:   07/16/17 1000  TempSrc:   PainSc: (P) Asleep         Complications: No apparent anesthesia complications

## 2017-07-16 NOTE — Anesthesia Procedure Notes (Signed)
Procedure Name: Intubation Date/Time: 07/16/2017 7:37 AM Performed by: Mitzie Na, CRNA Pre-anesthesia Checklist: Patient identified, Emergency Drugs available, Suction available and Patient being monitored Patient Re-evaluated:Patient Re-evaluated prior to induction Oxygen Delivery Method: Circle system utilized Preoxygenation: Pre-oxygenation with 100% oxygen Induction Type: IV induction Ventilation: Mask ventilation without difficulty Laryngoscope Size: Mac and 3 Grade View: Grade I Tube type: Oral Tube size: 7.0 mm Number of attempts: 1 Airway Equipment and Method: Stylet Placement Confirmation: ETT inserted through vocal cords under direct vision,  positive ETCO2 and breath sounds checked- equal and bilateral Secured at: 22 cm Tube secured with: Tape Dental Injury: Teeth and Oropharynx as per pre-operative assessment

## 2017-07-16 NOTE — H&P (Signed)
CC: Here today for surgery - laparoscopic vs open right hemicolectomy  HPI: Ms. Turnbaugh is a very pleasant 67yoF with hx of HLD, tobacco abuse, endometriosis, menorrhagia presents for evaluation following colonscopy. she underwent her first screening colonoscopy 03/29/17 by Dr. Marius Ditch. She was found to have: 1. 7 mm polyp in cecum-sessile-removed-tubular adenoma 2. 10 mm polyp-cecum-carpet-like-removed via EMR-tubular adenoma 3. 10 mm polyp in proximal ascending colon-carpet-like-removed via EMR-tubular adenoma 4. Two 5 mm polyps in the ascending colon - sessile-removed-tubular adenoma 5. 12 mm polyp in mid ASCENDING colon carpet-likeEMR-only partially removed-tubular adenoma - EMR used methylene blue [corrected location from prior note] 6. 2.5 cm polyp in the ASCENDING colon-carpet-like-not removed - tattooed [corrected location from prior note] 7. 8 mm polyp-transverse colon-removed-tubular adenoma 8. 10 mm polyp in descending colon-flat-EMR-tubular adenoma 9. 8 mm polyp and sigmoid-sessile-removed-tubular adenoma  Totaling 10 polyps  She recovered from this well. She denies any hx of rectal bleeding. She denies any recent hx of abdominal pain.  She had a possible referral in to [facility] to consideration of ESD of this 2.5cm polyp but has opted to pursue surgery for definitive management. We discussed the option of an ESD attempt as well but she would rather have the polyp addressed as though it's cancer.  INTERVAL HISTORY She completed her evaluation with genetics and tested negative for any known genetic mutation   Past Medical History:  Diagnosis Date  . Arthritis    mild  . Cancer (HCC)    skin cancer leg and chest  . Dysrhythmia    palpitation  . Family history of colon cancer   . Headache    as  a child   . Heart murmur    while pregnant    Past Surgical History:  Procedure Laterality Date  . ABDOMINAL HYSTERECTOMY     1996  . COLONOSCOPY WITH PROPOFOL N/A  03/29/2017   Procedure: COLONOSCOPY WITH PROPOFOL;  Surgeon: Lin Landsman, MD;  Location: [facility] ENDOSCOPY;  Service: Gastroenterology;  Laterality: N/A;  . HEMICOLECTOMY     righ  t6-21-19  . TUBAL LIGATION      Family History  Problem Relation Age of Onset  . Diabetes Mother   . Kidney disease Mother   . Heart attack Father   . Diabetes Maternal Grandmother   . Colon cancer Maternal Grandfather   . Heart disease Paternal Grandmother   . Colon cancer Paternal Uncle        dx >50, colon completely removed  . Colon cancer Paternal Aunt 32    Social:  reports that she has been smoking cigarettes.  She has a 33.75 pack-year smoking history. She has never used smokeless tobacco. She reports that she drinks about 7.2 oz of alcohol per week. She reports that she does not use drugs.  Allergies: No Known Allergies  Medications: I have reviewed the patient's current medications.  No results found for this or any previous visit (from the past 48 hour(s)).  No results found.  ROS - all of the below systems have been reviewed with the patient and positives are indicated with bold text General: chills, fever or night sweats Eyes: blurry vision or double vision ENT: epistaxis or sore throat Allergy/Immunology: itchy/watery eyes or nasal congestion Hematologic/Lymphatic: bleeding problems, blood clots or swollen lymph nodes Endocrine: temperature intolerance or unexpected weight changes Breast: new or changing breast lumps or nipple discharge Resp: cough, shortness of breath, or wheezing CV: chest pain or dyspnea on exertion GI: as per HPI  GU: dysuria, trouble voiding, or hematuria MSK: joint pain or joint stiffness Neuro: TIA or stroke symptoms Derm: pruritus and skin lesion changes Psych: anxiety and depression  PE Blood pressure 118/80, pulse 62, resp. rate 16, height 5\' 10"  (1.778 m), weight 66.7 kg (147 lb), SpO2 100 %. Constitutional: NAD; conversant; no deformities Eyes:  Moist conjunctiva; no lid lag; anicteric; PERRL Neck: Trachea midline; no thyromegaly Lungs: Normal respiratory effort; no tactile fremitus CV: RRR; no palpable thrills; no pitting edema GI: Abd soft, NT/ND; no palpable hepatosplenomegaly MSK: Normal gait; no clubbing/cyanosis Psychiatric: Appropriate affect; alert and oriented x3 Lymphatic: No palpable cervical or axillary lymphadenopathy  A/P: GLENNETTE [name] is an 67 y.o. female with endoscopically unresectable polyps in ascending colon - 4 tattoos between cecum and ascending colon; tattoo in descending colon but polyp removed and benign  -OR today for laparoscopic right hemicolectomy with final plans based on intraoperative findings of the location of the tattoos.  -I discussed with her the anatomy and physiology of the GI tract again today, the pathophysiology of colon polyps and colon cancer and the recommendations for treatment.  -I worked to give her an overview of the surgical options for her conditions as well as the technical aspects of the procedure.  -The planned procedure, material risks (including, but not limited to, pain, bleeding, infection, scarring, need for blood transfusion, damage to surrounding structures- blood vessels/nerves/viscus/organs, damage to ureter, urine leak, leak from anastomosis, need for additional procedures, need for stoma which may be permanent, hernia, recurrence, pneumonia, heart attack, stroke, death) benefits and alternatives to surgery were discussed at length. Her questions were answered to her satisfaction, she voiced understanding and elected to proceed with surgery  Sharon Mt. Dema Severin, M.D. General and Colorectal Surgery [facility]

## 2017-07-16 NOTE — Anesthesia Postprocedure Evaluation (Signed)
Anesthesia Post Note  Patient: Jennifer [name]  Procedure(s) Performed: LAPAROSCOPIC RIGHT HEMI COLECTOMY (Right Abdomen)     Patient location during evaluation: PACU Anesthesia Type: General Level of consciousness: awake and alert Pain management: pain level controlled Vital Signs Assessment: post-procedure vital signs reviewed and stable Respiratory status: spontaneous breathing, nonlabored ventilation and respiratory function stable Cardiovascular status: blood pressure returned to baseline and stable Postop Assessment: no apparent nausea or vomiting Anesthetic complications: no    Last Vitals:  Vitals:   07/16/17 0530 07/16/17 1000  BP: 118/80 124/65  Pulse: 62 75  Resp: 16 15  Temp:  36.5 C  SpO2: 100% 100%    Last Pain:  Vitals:   07/16/17 1000  TempSrc:   PainSc: Asleep                 Catalina Gravel

## 2017-07-16 NOTE — Anesthesia Preprocedure Evaluation (Addendum)
Anesthesia Evaluation  Patient identified by MRN, date of birth, ID band Patient awake    Reviewed: Allergy & Precautions, NPO status , Patient's Chart, lab work & pertinent test results  Airway Mallampati: II  TM Distance: >3 FB Neck ROM: Full    Dental  (+) Dental Advisory Given, Partial Upper, Poor Dentition   Pulmonary Current Smoker,    Pulmonary exam normal breath sounds clear to auscultation       Cardiovascular Normal cardiovascular exam+ dysrhythmias (Palpitations)  Rhythm:Regular Rate:Normal     Neuro/Psych  Headaches,    GI/Hepatic negative GI ROS, Neg liver ROS, Colon polyp   Endo/Other  negative endocrine ROS  Renal/GU negative Renal ROS     Musculoskeletal  (+) Arthritis ,   Abdominal   Peds  Hematology negative hematology ROS (+)   Anesthesia Other Findings Day of surgery medications reviewed with the patient.  Reproductive/Obstetrics                             Anesthesia Physical Anesthesia Plan  ASA: III  Anesthesia Plan: General   Post-op Pain Management:    Induction: Intravenous  PONV Risk Score and Plan: 3 and Scopolamine patch - Pre-op, Midazolam, Dexamethasone and Ondansetron  Airway Management Planned: Oral ETT  Additional Equipment:   Intra-op Plan:   Post-operative Plan: Extubation in OR  Informed Consent: I have reviewed the patients History and Physical, chart, labs and discussed the procedure including the risks, benefits and alternatives for the proposed anesthesia with the patient or authorized representative who has indicated his/her understanding and acceptance.   Dental advisory given  Plan Discussed with: CRNA  Anesthesia Plan Comments: (ERAS protocol)       Anesthesia Quick Evaluation

## 2017-07-16 NOTE — Op Note (Signed)
PATIENT: Jennifer [name]  67 y.o. female  Patient Care Team: Paulene Floor as PCP - General (Physician Assistant)  PREOP DIAGNOSIS: Endoscopically unresectable right colon polyps  POSTOP DIAGNOSIS: Same  PROCEDURE: 1. Laparoscopic right hemicolectomy with stapled ileocolic anastomosis 2. Primary repair of umbilical hernia  SURGEON: Sharon Mt. Dema Severin, MD  ASSISTANT: Michael Boston, MD  ANESTHESIA: General endotracheal  EBL: 20cc Total I/O In: -  Out: 125 [Urine:100; Blood:25]  DRAINS: None  SPECIMEN: Terminal ileum + right colon as one unit  COUNTS: Sponge, needle and instrument counts were reported correct x2  FINDINGS: Tattoo identified in mid a sending colon.  No other tattoos are visualized within the colon.  Laparoscopic right hemicolectomy was performed in the medial to lateral approach.  A double stapled ileocolic anastomosis was performed.  The specimen was opened on the back table and the polyp was identified within the lumen of the colon as well as the other endoscopically unresectable polyp.  A 1 x 1 cm umbilical hernia was identified and closed primarily.  STATEMENT OF MEDICAL NECESSITY: Jennifer [name] is a very pleasant 78yoF with hx of HLD, tobacco abuse, endometriosis, menorrhagia presents for evaluation following colonscopy. she underwent her first screening colonoscopy 03/29/17 by Dr. Marius Ditch. She was found to have: 1. 7 mm polyp in cecum-sessile-removed-tubular adenoma 2. 10 mm polyp-cecum-carpet-like-removed via EMR-tubular adenoma 3. 10 mm polyp in proximal ascending colon-carpet-like-removed via EMR-tubular adenoma 4. Two 5 mm polyps in the ascending colon - sessile-removed-tubular adenoma 5. 12 mm polyp in mid ASCENDING colon carpet-like EMR-only partially removed-tubular adenoma - EMR used methylene blue 6. 2.5 cm polyp in the ASCENDING colon-carpet-like-not removed - tattooed 7. 8 mm polyp-transverse colon-removed-tubular adenoma 8. 10 mm polyp  in descending colon-flat-EMR-tubular adenoma 9. 8 mm polyp and sigmoid-sessile-removed-tubular adenoma  Totaling 10 polyps  She recovered from this well. She denies any hx of rectal bleeding. She denies any recent hx of abdominal pain.  She had a possible referral in to [facility] to consideration of ESD of this 2.5cm polyp but opted to pursue surgery for definitive management. We discussed the option of an ESD attempt as well but she would rather have the polyp addressed as though it's cancer.  She completed a evaluation with genetics and tested negative for any known genetic mutation  Given the above, she was recommended laparoscopic versus open right hemicolectomy.  Please refer to H&P for details regarding this discussion.  NARRATIVE:  The patient was identified & brought into the operating room, placed supine on the operating table and SCDs were applied to the lower extremities. General endotracheal anesthesia was induced. The patient was positioned supine with arms tucked. Antibiotics were administered. A foley catheter was placed under sterile conditions. The abdomen was prepped and draped in a sterile fashion. A timeout was performed confirming our patient and plan.   A small supraumbilical midline incision was made at the location of the planned extra corporeal/specimen extraction site.  This was carried down to the subcutaneous tissue level of the midline fascia.  The fascia was incised and grasped with Kocher clamps.  The peritoneum was then entered sharply.  An [place] wound protector was then placed.  A yellow cap was placed on the wound protector with a 10 mm port in it.  The abdomen was then insufflated with CO2 to a maximum pressure of 15 mmHg.  Bilateral TAP blocks were then performed with a mixture of Exparel diluted with Marcaine with epinephrine.  3 additional 5 mm ports  were placed to in the left abdomen and one in the right.  The patient was positioned in trendelenburg with left  side down.  The omentum was swept above the liver.  The bowel was swept out of the pelvis.  The tattoo was readily identified in the mid ascending colon.  The remaining transverse and descending colon were visualized and no tattoo identified.  A medial to lateral approach was then employed. The ileocolic pedicle was identified and elevated anteriorly.  The overlying peritoneum was incised.  Gentle blunt dissection commenced around the pedicle and the duodenum was identified and freed from the surrounding structures.  The plane was developed cephalad up behind the hepatic flexure not of the transverse colon.  The right ureter was visualized during this dissection due to how thin her retroperitoneum was and was protected and kept "down."  The cecal and ascending colon mesentery was freed from its attachments to the underlying duodenum.  The duodenum was protected.  The ileocolic pedicle was circumferentially dissected and a high ligation was performed using the Enseal.  The pedicle was then inspected and observed and noted to be hemostatic.  The patient was then repositioned in reverse Trendelenburg.  The omentum attaching to the transverse colon was identified and preserved.  The omentum was retracted anteriorly and the lesser sac was entered.  This plane was also developed working towards the hepatic flexure.  At this point attention was turned to performing a lateral to medial mobilization of the colon.  The patient was repositioned in Trendelenburg.  The appendix and cecum were grasped and retracted medially.  The Ritaj Dullea line of [place] was incised working cephalad up to the hepatic flexure and the entire right colon was mobilized and able to reach past the midline.  At this point, attention was turned to the extracorporeal portion of the procedure.  The pneumoperitoneum was released.  The cap was taken off the [place] wound protector and the terminal ileum and colon delivered through the wound.  The wound was draped  out with blue towels.  The terminal ileum was identified.  On the mesenteric border a window was created.  The terminal ileum was then divided using a GIA blue load stapler.  The distal point of transection was identified just distal to the right branch of the middle colic artery, taking in the right branch the middle colic artery with the specimen.  A window was created in the mesentery at this level and the colon was divided using a GIA blue load stapler.  The intervening mesentery was then ligated using the Enseal taking a wide swath of mesentery to ensure appropriate lymph node harvest.  During this, the duodenum was identified and protected free of injury.  The specimen was then passed off.  I then went to the back table open the specimen and confirmed that the 2 descending colon polyps are left in situ were removed and they were.  Gowns/gloves were changed.   Attention was turned to perform the ileocolic anastomosis.  A stay suture was placed.  The antimesenteric corners of the distal ileum and transverse colon were trimmed.  An anastomosis was then created using a GIA 75 mm blue load stapler.  The anastomosis was inspected and noted to be hemostatic.  The common enterotomy with end staple lines were then excised using a TA 59mm blue load stapler.  2 small staple line bleeders were controlled with 3-0 silk U stitches.  The corners of the anastomosis were then dunked with 2-0  silk suture.  A 2-0 silk "crotch" stitch was placed.  The anastomosis was palpated and noted to be widely patent, 3 fingerbreadths wide.  This was returned to the perineal cavity and covered with omentum.  Reinspection of the abdomen revealed hemostasis to have been achieved.  The [place] wound protector was removed and all equipment was passed off.  Gowns/gloves were changed of all scrubbed participants as were instruments using a clean closing tray.  She had a small 1 x 1 cm umbilical hernia which was approximately 1 cm from the  distal aspect of our midline incision and therefore the incision was widened to incorporate this.  The preperitoneal fat was removed from the defect.  The midline fascia was then closed using 2 running #1 looped PDS sutures.  Additional local anesthetic consisting of Exparel with Marcaine was infiltrated into the midline fascia.  The midline wound was then irrigated with sterile saline.  The skin of all incision sites was closed with running 4-0 Monocryl subcuticular sutures.  Dermabond was placed over the port sites and a honeycomb dressing over the midline extraction site.  The patient was then awakened from general anesthesia, extubated and transferred to a stretcher for transport to PACU in satisfactory condition  Note: An MD assistant was necessary due to prior pelvic surgery and need for complex retraction as no other qualified help was available.  DISPOSITION: PACU in satisfactory condition   Note: This dictation was prepared with Dragon/digital dictation along with [employer]. Any transcriptional errors that result from this process are unintentional.

## 2017-07-17 ENCOUNTER — Encounter (HOSPITAL_COMMUNITY): Payer: Self-pay | Admitting: Surgery

## 2017-07-17 DIAGNOSIS — K429 Umbilical hernia without obstruction or gangrene: Secondary | ICD-10-CM

## 2017-07-17 DIAGNOSIS — M1990 Unspecified osteoarthritis, unspecified site: Secondary | ICD-10-CM | POA: Diagnosis present

## 2017-07-17 DIAGNOSIS — E785 Hyperlipidemia, unspecified: Secondary | ICD-10-CM

## 2017-07-17 HISTORY — DX: Hyperlipidemia, unspecified: E78.5

## 2017-07-17 LAB — CBC
HCT: 39.3 % (ref 36.0–46.0)
HEMOGLOBIN: 13.1 g/dL (ref 12.0–15.0)
MCH: 29.4 pg (ref 26.0–34.0)
MCHC: 33.3 g/dL (ref 30.0–36.0)
MCV: 88.1 fL (ref 78.0–100.0)
PLATELETS: 187 10*3/uL (ref 150–400)
RBC: 4.46 MIL/uL (ref 3.87–5.11)
RDW: 14.1 % (ref 11.5–15.5)
WBC: 11.5 10*3/uL — ABNORMAL HIGH (ref 4.0–10.5)

## 2017-07-17 LAB — BASIC METABOLIC PANEL
Anion gap: 8 (ref 5–15)
BUN: 10 mg/dL (ref 6–20)
CALCIUM: 8.8 mg/dL — AB (ref 8.9–10.3)
CHLORIDE: 104 mmol/L (ref 101–111)
CO2: 25 mmol/L (ref 22–32)
CREATININE: 0.79 mg/dL (ref 0.44–1.00)
GFR calc non Af Amer: >60 mL/min (ref >60)
GLUCOSE: 96 mg/dL (ref 65–99)
Potassium: 3.6 mmol/L (ref 3.5–5.1)
Sodium: 137 mmol/L (ref 135–145)

## 2017-07-17 LAB — PHOSPHORUS: PHOSPHORUS: 4 mg/dL (ref 2.5–4.6)

## 2017-07-17 LAB — MAGNESIUM: Magnesium: 1.8 mg/dL (ref 1.7–2.4)

## 2017-07-17 NOTE — Progress Notes (Addendum)
Subjective No acute events. Doing well. Some nausea yesterday. No emesis. Ambulating. No flatus/BM yet. Pain well controlled  Objective: Vital signs in last 24 hours: Temp:  [97.7 F (36.5 C)-98.8 F (37.1 C)] 98.3 F (36.8 C) (06/22 2703) Pulse Rate:  [50-76] 50 (06/22 0638) Resp:  [14-18] 18 (06/22 5009) BP: (108-132)/(58-72) 125/58 (06/22 0638) SpO2:  [96 %-100 %] 97 % (06/22 3818) Weight:  [71.4 kg (157 lb 6.5 oz)] 71.4 kg (157 lb 6.5 oz) (06/22 2993) Last BM Date: 07/16/17  Intake/Output from previous day: 06/21 0701 - 06/22 0700 In: 1680 [P.O.:60; I.V.:1520; IV Piggyback:100] Out: 7169 [Urine:1700; Blood:25] Intake/Output this shift: No intake/output data recorded.  Gen: NAD, comfortable CV: RRR Pulm: Normal work of breathing Abd: Soft, NT/ND; incisions c/d/i with dermabond/honeycomb in place. No erythema. Ext: SCDs in place  Lab Results: CBC  No results for input(s): WBC, HGB, HCT, PLT in the last 72 hours. BMET Recent Labs    07/17/17 0517  NA 137  K 3.6  CL 104  CO2 25  GLUCOSE 96  BUN 10  CREATININE 0.79  CALCIUM 8.8*   PT/INR No results for input(s): LABPROT, INR in the last 72 hours. ABG No results for input(s): PHART, HCO3 in the last 72 hours.  Invalid input(s): PCO2, PO2  Studies/Results:  Anti-infectives: Anti-infectives (From admission, onward)   Start     Dose/Rate Route Frequency Ordered Stop   07/16/17 1400  neomycin (MYCIFRADIN) tablet 1,000 mg  Status:  Discontinued     1,000 mg Oral 3 times per day 07/16/17 0548 07/16/17 0550   07/16/17 1400  metroNIDAZOLE (FLAGYL) tablet 1,000 mg  Status:  Discontinued     1,000 mg Oral 3 times per day 07/16/17 0548 07/16/17 0550   07/16/17 0600  cefoTEtan (CEFOTAN) 2 g in sodium chloride 0.9 % 100 mL IVPB     2 g 200 mL/hr over 30 Minutes Intravenous On call to O.R. 07/16/17 0548 07/16/17 0745       Assessment/Plan: Patient Active Problem List   Diagnosis Date Noted  . Umbilical hernia  s/p primary repair 07/16/2017 07/17/2017  . Hyperlipidemia 07/17/2017  . Arthritis   . Adenomatous polyp of ascending colon s/p right colectomy 07/16/2017 07/16/2017  . Genetic testing 05/13/2017  . History of colonic polyps 05/03/2017  . Family history of colon cancer   . Special screening for malignant neoplasms, colon   . Tobacco abuse 03/10/2017   s/p Procedure(s): LAPAROSCOPIC RIGHT HEMI COLECTOMY 07/16/2017 for endoscopically unresectable polyps - POD#1  -Full liquids, soft foods as tolerated -Continue IVF until reliably tolerating PO -Continue entereg until BM -Ambulate 5x/day -IS 10x/hr while awake -PPx: SQH, SCDs   LOS: 1 day   Sharon Mt. Dema Severin, M.D. General and Colorectal Surgery [facility]

## 2017-07-17 NOTE — Progress Notes (Signed)
Pt's noticed IV was bleeding and started to infiltrate. IV was removed and pt was informed that we needed to start another IV.  Pt refused to have IV restarted at this time, RN will monitor.

## 2017-07-18 LAB — CBC
HEMATOCRIT: 38.6 % (ref 36.0–46.0)
HEMOGLOBIN: 12.6 g/dL (ref 12.0–15.0)
MCH: 29.1 pg (ref 26.0–34.0)
MCHC: 32.6 g/dL (ref 30.0–36.0)
MCV: 89.1 fL (ref 78.0–100.0)
Platelets: 172 10*3/uL (ref 150–400)
RBC: 4.33 MIL/uL (ref 3.87–5.11)
RDW: 14.6 % (ref 11.5–15.5)
WBC: 8.5 10*3/uL (ref 4.0–10.5)

## 2017-07-18 LAB — BASIC METABOLIC PANEL
ANION GAP: 5 (ref 5–15)
BUN: 10 mg/dL (ref 6–20)
CHLORIDE: 110 mmol/L (ref 101–111)
CO2: 29 mmol/L (ref 22–32)
Calcium: 8.9 mg/dL (ref 8.9–10.3)
Creatinine, Ser: 0.81 mg/dL (ref 0.44–1.00)
GFR calc Af Amer: >60 mL/min (ref >60)
GFR calc non Af Amer: >60 mL/min (ref >60)
GLUCOSE: 84 mg/dL (ref 65–99)
POTASSIUM: 4.2 mmol/L (ref 3.5–5.1)
Sodium: 144 mmol/L (ref 135–145)

## 2017-07-18 LAB — PHOSPHORUS: Phosphorus: 2.3 mg/dL — ABNORMAL LOW (ref 2.5–4.6)

## 2017-07-18 LAB — MAGNESIUM: Magnesium: 2 mg/dL (ref 1.7–2.4)

## 2017-07-18 MED ORDER — METHOCARBAMOL 500 MG PO TABS: 1000.0000 mg | ORAL_TABLET | Freq: Four times a day (QID) | ORAL | Status: DC | PRN

## 2017-07-18 MED ORDER — GUAIFENESIN-DM 100-10 MG/5ML PO SYRP: 10.0000 mL | ORAL_SOLUTION | ORAL | Status: DC | PRN

## 2017-07-18 MED ORDER — MENTHOL 3 MG MT LOZG: 1.0000 | LOZENGE | OROMUCOSAL | Status: DC | PRN

## 2017-07-18 MED ORDER — METOPROLOL TARTRATE 5 MG/5ML IV SOLN: 5.0000 mg | Freq: Four times a day (QID) | INTRAVENOUS | Status: DC | PRN

## 2017-07-18 MED ORDER — GABAPENTIN 300 MG PO CAPS
300.0000 mg | ORAL_CAPSULE | Freq: Two times a day (BID) | ORAL | Status: DC
  Administered 2017-07-18 – 2017-07-19 (×3): 300 mg via ORAL
  Filled 2017-07-18 (×3): qty 1

## 2017-07-18 MED ORDER — SODIUM CHLORIDE 0.9 % IV SOLN: 250.0000 mL | INTRAVENOUS | Status: DC | PRN

## 2017-07-18 MED ORDER — PSYLLIUM 95 % PO PACK
1.0000 | PACK | Freq: Every day | ORAL | Status: DC
  Administered 2017-07-18: 1 via ORAL
  Filled 2017-07-18 (×2): qty 1

## 2017-07-18 MED ORDER — DIPHENHYDRAMINE HCL 50 MG/ML IJ SOLN: 12.5000 mg | Freq: Four times a day (QID) | INTRAMUSCULAR | Status: DC | PRN

## 2017-07-18 MED ORDER — SODIUM CHLORIDE 0.9% FLUSH: 3.0000 mL | Freq: Two times a day (BID) | INTRAVENOUS | Status: DC

## 2017-07-18 MED ORDER — METHOCARBAMOL 1000 MG/10ML IJ SOLN
1000.0000 mg | Freq: Four times a day (QID) | INTRAVENOUS | Status: DC | PRN
  Filled 2017-07-18: qty 10

## 2017-07-18 MED ORDER — HYDROCORTISONE 2.5 % RE CREA: 1.0000 "application " | TOPICAL_CREAM | Freq: Four times a day (QID) | RECTAL | Status: DC | PRN

## 2017-07-18 MED ORDER — SODIUM CHLORIDE 0.9% FLUSH: 3.0000 mL | INTRAVENOUS | Status: DC | PRN

## 2017-07-18 MED ORDER — LACTATED RINGERS IV BOLUS: 1000.0000 mL | Freq: Three times a day (TID) | INTRAVENOUS | Status: DC | PRN

## 2017-07-18 MED ORDER — OXYCODONE HCL 5 MG PO TABS
5.0000 mg | ORAL_TABLET | ORAL | Status: DC | PRN
  Administered 2017-07-18: 10 mg via ORAL
  Administered 2017-07-18: 5 mg via ORAL
  Filled 2017-07-18: qty 2
  Filled 2017-07-18: qty 1
  Filled 2017-07-18: qty 2

## 2017-07-18 MED ORDER — PHENOL 1.4 % MT LIQD: 1.0000 | OROMUCOSAL | Status: DC | PRN

## 2017-07-18 MED ORDER — ACETAMINOPHEN 500 MG PO TABS
1000.0000 mg | ORAL_TABLET | Freq: Three times a day (TID) | ORAL | Status: DC
  Administered 2017-07-18 – 2017-07-19 (×4): 1000 mg via ORAL
  Filled 2017-07-18 (×4): qty 2

## 2017-07-18 MED ORDER — HYDROCORTISONE 1 % EX CREA: 1.0000 "application " | TOPICAL_CREAM | Freq: Three times a day (TID) | CUTANEOUS | Status: DC | PRN

## 2017-07-18 MED ORDER — NAPROXEN 250 MG PO TABS
500.0000 mg | ORAL_TABLET | Freq: Two times a day (BID) | ORAL | Status: DC
  Administered 2017-07-18 – 2017-07-19 (×2): 500 mg via ORAL
  Filled 2017-07-18 (×3): qty 2

## 2017-07-18 NOTE — Progress Notes (Signed)
Jennifer [name] [id number] August 09, 1950  CARE TEAM:  PCP: Jennifer Post, PA-C  Outpatient Care Team: Patient Care Team: Jennifer [name] as PCP - General (Physician Assistant)  Inpatient Treatment Team: Treatment Team: Attending Provider: Ileana Roup, MD; Registered Nurse: Jennifer Serene, RN; Technician: Jennifer [name], NT   Problem List:   Principal Problem:   Adenomatous polyp of ascending colon s/p right colectomy 07/16/2017 Active Problems:   Tobacco abuse   Umbilical hernia s/p primary repair 07/16/2017   Arthritis   Hyperlipidemia   2 Days [name]-Op  07/16/2017  PREOP DIAGNOSIS: Endoscopically unresectable right colon polyps  POSTOP DIAGNOSIS: Same  PROCEDURE: 1. Laparoscopic right hemicolectomy with stapled ileocolic anastomosis 2. Primary repair of umbilical hernia  SURGEON: Jennifer Mt. White, MD    [facility] Stay = 2 days   Plan:  -adv to solid diet -stop IVF -switch ibuprofen to naproxen.  Add gabapentin.  Continue Tylenol.  Stop tramadol with question hallucinations.  Offer oxycodone instead.  Robaxin as needed. -f/u pathology -hyperlipidemia controlled -VTE prophylaxis- SCDs, etc -mobilize as tolerated to help recovery  D/C patient from hospital when patient meets criteria (anticipate in 1-2 day(s)):  Tolerating oral intake well Ambulating well Adequate pain control without IV medications Urinating  Having flatus Disposition planning in place   20 minutes spent in review, evaluation, examination, counseling, and coordination of care.  More than 50% of that time was spent in counseling.  Jennifer Hector, MD, FACS, MASCRS Gastrointestinal and Minimally Invasive Surgery    [address], [place] [address] [place], [place] [address] [phone] Main / Paging [phone] Fax   07/18/2017    Subjective: (Chief complaint)  Still having sharp pain when she moves and coughs.   Worse yesterday when she was coughing a lot.  Tramadol made her feel weird and loopy.  Objective:  Vital signs:  Vitals:   07/17/17 0855 07/17/17 1310 07/17/17 2207 07/18/17 0428  BP: 113/64 115/60 135/65 119/72  Pulse: (!) 55 (!) 58 (!) 45 (!) 45  Resp: _0 Temp: 98.4 F (36.9 C) 98 F (36.7 C) (!) 97.4 F (36.3 C) 97.7 F (36.5 C)  TempSrc: Oral Oral Axillary Oral  SpO2: 97% 99% 99% 96%  Weight:      Height:        Last BM Date: 07/17/17  Intake/Output   Yesterday:  06/22 0701 - 06/23 0700 In: 2636.3 [P.O.:1080; I.V.:1556.3] Out: 2350 [Urine:2350] This shift:  No intake/output data recorded.  Bowel function:  Flatus: YES  BM:  YES  Drain: (No drain)   Physical Exam:  General: Pt awake/alert/oriented x4 in no acute distress Eyes: PERRL, normal EOM.  Sclera clear.  No icterus Neuro: CN II-XII intact w/o focal sensory/motor deficits. Lymph: No head/neck/groin lymphadenopathy Psych:  No delerium/psychosis/paranoia HENT: Normocephalic, Mucus membranes moist.  No thrush Neck: Supple, No tracheal deviation Chest: No chest wall pain w good excursion CV:  Pulses intact.  Regular rhythm MS: Normal AROM mjr joints.  No obvious deformity  Abdomen: Soft.  Nondistended.  Mildly tender at incisions only.  No evidence of peritonitis.  No incarcerated hernias.  Ext:  No deformity.  No mjr edema.  No cyanosis Skin: No petechiae / purpura  Results:   Labs: Results for orders placed or performed during the hospital encounter of 07/16/17 (from the past 48 hour(s))  Magnesium     Status: None   Collection Time: 07/16/17 11:13  AM  Result Value Ref Range   Magnesium 1.9 1.7 - 2.4 mg/dL    Comment: Performed at [facility], [place] [address] [place], [place] [address]  Basic metabolic panel     Status: Abnormal   Collection Time: 07/17/17  5:17 AM  Result Value Ref Range   Sodium 137 135 - 145 mmol/L   Potassium 3.6 3.5 - 5.1 mmol/L    [place] 104 101 - 111 mmol/L   CO2 25 22 - 32 mmol/L   Glucose, Bld 96 65 - 99 mg/dL   BUN 10 6 - 20 mg/dL   Creatinine, Ser 0.79 0.44 - 1.00 mg/dL   Calcium 8.8 (L) 8.9 - 10.3 mg/dL   GFR calc non Af Amer >60 >60 mL/min   GFR calc Af Amer >60 >60 mL/min    Comment: (NOTE) The eGFR has been calculated using the CKD EPI equation. This calculation has not been validated in all clinical situations. eGFR's persistently <60 mL/min signify possible Chronic Kidney Disease.    Anion gap 8 5 - 15    Comment: Performed at [facility], [place] [address] [place], [place] [address]  CBC     Status: Abnormal   Collection Time: 07/17/17  5:17 AM  Result Value Ref Range   WBC 11.5 (H) 4.0 - 10.5 K/uL   RBC 4.46 3.87 - 5.11 MIL/uL   Hemoglobin 13.1 12.0 - 15.0 g/dL   HCT 39.3 36.0 - 46.0 %   MCV 88.1 78.0 - 100.0 fL   MCH 29.4 26.0 - 34.0 pg   MCHC 33.3 30.0 - 36.0 g/dL   RDW 14.1 11.5 - 15.5 %   Platelets 187 150 - 400 K/uL    Comment: Performed at [facility], [place] [address], [place], [place] [address]  Phosphorus     Status: None   Collection Time: 07/17/17  5:17 AM  Result Value Ref Range   Phosphorus 4.0 2.5 - 4.6 mg/dL    Comment: Performed at [facility], [place] [address] [place], [place] [address]  Magnesium     Status: None   Collection Time: 07/17/17  5:17 AM  Result Value Ref Range   Magnesium 1.8 1.7 - 2.4 mg/dL    Comment: Performed at [facility], [place] [address], [place], [place] [address]  Basic metabolic panel     Status: None   Collection Time: 07/18/17  5:16 AM  Result Value Ref Range   Sodium 144 135 - 145 mmol/L    Comment: DELTA CHECK NOTED   Potassium 4.2 3.5 - 5.1 mmol/L   [place] 110 101 - 111 mmol/L   CO2 29 22 - 32 mmol/L   Glucose, Bld 84 65 - 99 mg/dL   BUN 10 6 - 20 mg/dL   Creatinine, Ser 0.81 0.44 - 1.00 mg/dL   Calcium 8.9 8.9 - 10.3 mg/dL   GFR calc non Af Amer >60 >60  mL/min   GFR calc Af Amer >60 >60 mL/min    Comment: (NOTE) The eGFR has been calculated using the CKD EPI equation. This calculation has not been validated in all clinical situations. eGFR's persistently <60 mL/min signify possible Chronic Kidney Disease.    Anion gap 5 5 - 15    Comment: Performed at [facility], [place] [address], [place], [place] [address]  CBC     Status: None   Collection Time: 07/18/17  5:16 AM  Result Value Ref  Range   WBC 8.5 4.0 - 10.5 K/uL   RBC 4.33 3.87 - 5.11 MIL/uL   Hemoglobin 12.6 12.0 - 15.0 g/dL   HCT 38.6 36.0 - 46.0 %   MCV 89.1 78.0 - 100.0 fL   MCH 29.1 26.0 - 34.0 pg   MCHC 32.6 30.0 - 36.0 g/dL   RDW 14.6 11.5 - 15.5 %   Platelets 172 150 - 400 K/uL    Comment: Performed at [facility], [place] [address] [place], [place] [address]  Phosphorus     Status: Abnormal   Collection Time: 07/18/17  5:16 AM  Result Value Ref Range   Phosphorus 2.3 (L) 2.5 - 4.6 mg/dL    Comment: DELTA CHECK NOTED Performed at [place] [address], [place], [place] [address]   Magnesium     Status: None   Collection Time: 07/18/17  5:16 AM  Result Value Ref Range   Magnesium 2.0 1.7 - 2.4 mg/dL    Comment: Performed at [facility], [place] [address], [place], [place] [address]    Imaging / Studies: No results found.  Medications / Allergies: per chart  Antibiotics: Anti-infectives (From admission, onward)   Start     Dose/Rate Route Frequency Ordered Stop   07/16/17 1400  neomycin (MYCIFRADIN) tablet 1,000 mg  Status:  Discontinued     1,000 mg Oral 3 times per day 07/16/17 0548 07/16/17 0550   07/16/17 1400  metroNIDAZOLE (FLAGYL) tablet 1,000 mg  Status:  Discontinued     1,000 mg Oral 3 times per day 07/16/17 0548 07/16/17 0550   07/16/17 0600  cefoTEtan (CEFOTAN) 2 g in sodium chloride 0.9 % 100 mL IVPB     2 g 200 mL/hr over 30 Minutes Intravenous On call to O.R.  07/16/17 0548 07/16/17 0745        Note: Portions of this report may have been transcribed using voice recognition software. Every effort was made to ensure accuracy; however, inadvertent computerized transcription errors may be present.   Any transcriptional errors that result from this process are unintentional.     Jennifer Hector, MD, FACS, MASCRS Gastrointestinal and Minimally Invasive Surgery    [address], [place], [place] [address] [phone] Main / Paging [phone] Fax

## 2017-07-19 MED ORDER — TRAMADOL HCL 50 MG PO TABS: 50.0000 mg | ORAL_TABLET | Freq: Four times a day (QID) | ORAL | 0 refills | Status: DC | PRN

## 2017-07-19 NOTE — Discharge Instructions (Signed)
POST OP INSTRUCTIONS  DIET: As tolerated. Be sure to include lots of fluids daily.  Avoid fast food or heavy meals as your are more likely to get nauseated.  Avoid raw fruits and vegetables (salads, etc) for the first 3-4 weeks 1. Take your usually prescribed home medications unless otherwise directed.  2. PAIN CONTROL: a. Pain is best controlled by a usual combination of three different methods TOGETHER: i. Ice/Heat ii. Over the counter pain medication iii. Prescription pain medication b. Most patients will experience some swelling and bruising around the surgical site.  Ice packs or heating pads (30-60 minutes up to 6 times a day) will help. Some people prefer to use ice alone, heat alone, alternating between ice & heat.  Experiment to what works for you.  Swelling and bruising can take several weeks to resolve.   c. It is helpful to take an over-the-counter pain medication regularly for the first few weeks: i. Ibuprofen (Motrin/Advil) - 200mg  tabs - take 3 tabs (600mg ) every 6 hours as needed for pain ii. Acetaminophen (Tylenol) - you may take 650mg  every 6 hours as needed. You can take this with motrin as they act differently on the body. If you are taking a narcotic pain medication that has acetaminophen in it, do not take over the counter tylenol at the same time.  Iii. NOTE: You may take both of these medications together - most patients  find it most helpful when alternating between the two (i.e. Ibuprofen at 6am,  tylenol at 9am, ibuprofen at 12pm ...) d. A  prescription for pain medication should be given to you upon discharge.  Take your pain medication as prescribed if your pain is not adequatly controlled with the over-the-counter pain reliefs mentioned above.  3. Avoid getting constipated.  Between the surgery and the pain medications, it is common to experience some constipation.  Increasing fluid intake and taking a fiber supplement (such as Metamucil, Citrucel, FiberCon, MiraLax,  etc) 1-2 times a day regularly will usually help prevent this problem from occurring.  A mild laxative (prune juice, Milk of Magnesia, MiraLax, etc) should be taken according to package directions if there are no bowel movements after 48 hours.    4. Dressing: Your incision is covered in Dermabond which is like sterile superglue for the skin. This will come off on it's own in a couple weeks. It is waterproof and you may bathe normally. Avoid baths/pools/lakes/oceans until your wounds have fully healed.  5. ACTIVITIES as tolerated:   a. Avoid heavy lifting (>15lbs or 1 gallon of milk) for the next 6 weeks. b. You may resume regular (light) daily activities beginning the next day--such as daily self-care, walking, climbing stairs--gradually increasing activities as tolerated.  If you can walk 30 minutes without difficulty, it is safe to try more intense activity such as jogging, treadmill, bicycling, low-impact aerobics.  c. DO NOT PUSH THROUGH PAIN.  Let pain be your guide: If it hurts to do something, don't do it. d. Dennis Bast may drive when you are no longer taking prescription pain medication, you can comfortably wear a seatbelt, and you can safely maneuver your car and apply brakes. e. Dennis Bast may have sexual intercourse when it is comfortable.   6. FOLLOW UP in our office a. Please call [facility] at [phone] to set up an appointment to see your surgeon in the office for a follow-up appointment approximately 2 weeks after your surgery. b. Make sure that you call for this appointment the day  you arrive home to insure a convenient appointment time.  9. If you have disability or family leave forms that need to be completed, you may have them completed by your primary care physician's office; for return to work instructions, please ask our office staff and they will be happy to assist you in obtaining this documentation   When to call us [phone]: 1. Poor pain control 2. Reactions / problems with new  medications (rash/itching, etc)  3. Fever over 101.5 F (38.5 C) 4. Inability to urinate 5. Nausea/vomiting 6. Worsening swelling or bruising 7. Continued bleeding from incision. 8. Increased pain, redness, or drainage from the incision  The clinic staff is available to answer your questions during regular business hours (8:30am-5pm).  Please dont hesitate to call and ask to speak to one of our nurses for clinical concerns.   A surgeon from [facility] is always on call at the hospitals   If you have a medical emergency, go to the nearest emergency room or call 911.  [facility], [place] [address] [place], [place], [place]  [address] MAIN: [id number] FAX: [phone] [email or web address]

## 2017-07-19 NOTE — Discharge Summary (Signed)
Patient ID: Jennifer [name] MRN: [id number] DOB/AGE: October 22, 1950 67 y.o.  Admit date: 07/16/2017 Discharge date: 07/19/2017  Discharge Diagnoses Patient Active Problem List   Diagnosis Date Noted  . Umbilical hernia s/p primary repair 07/16/2017 07/17/2017  . Hyperlipidemia 07/17/2017  . Arthritis   . Adenomatous polyp of ascending colon s/p right colectomy 07/16/2017 07/16/2017  . Genetic testing 05/13/2017  . History of colonic polyps 05/03/2017  . Family history of colon cancer   . Special screening for malignant neoplasms, colon   . Tobacco abuse 03/10/2017    Consultants None  Procedures Laparoscopic right hemicolectomy 07/16/17  Hospital Course: She was admitted postoperatively and recovered appropriately. She was having multiple BMs, passing flatus and tolerating a diet on POD#3. Her pain was well controlled on oral analgesics and she was ambulating comfortably on her own. She was deemed stable for discharge home.   Allergies as of 07/19/2017   No Active Allergies     Medication List    TAKE these medications   atorvastatin 10 MG tablet Commonly known as:  LIPITOR TAKE 1 TABLET BY MOUTH ONCE DAILY   nicotine 14 mg/24hr patch Commonly known as:  NICODERM CQ Place 1 patch (14 mg total) onto the skin daily.   nicotine 7 mg/24hr patch Commonly known as:  NICODERM CQ Place 1 patch (7 mg total) onto the skin daily.   traMADol 50 MG tablet Commonly known as:  ULTRAM Take 1 tablet (50 mg total) by mouth every 6 (six) hours as needed.          Sharon Mt. Dema Severin, M.D. [facility]

## 2017-07-20 ENCOUNTER — Other Ambulatory Visit (HOSPITAL_COMMUNITY): Payer: Self-pay | Admitting: General Surgery

## 2017-07-20 DIAGNOSIS — R609 Edema, unspecified: Secondary | ICD-10-CM

## 2017-07-21 ENCOUNTER — Ambulatory Visit (HOSPITAL_COMMUNITY)
Admission: RE | Admit: 2017-07-21 | Discharge: 2017-07-21 | Disposition: A | Discharge status: Home / Self Care | Payer: Medicare Other | Source: Ambulatory Visit | Attending: Physician Assistant | Admitting: Physician Assistant

## 2017-07-21 DIAGNOSIS — R609 Edema, unspecified: Secondary | ICD-10-CM | POA: Insufficient documentation

## 2017-07-21 NOTE — Progress Notes (Signed)
Bilateral lower extremity venous duplex completed. There is no evidence of a DVT or Baker's cyst [facility], RVS 07/21/2017, 11:41 AM

## 2017-08-10 ENCOUNTER — Encounter: Payer: Self-pay | Admitting: Physician Assistant

## 2017-08-10 ENCOUNTER — Ambulatory Visit (INDEPENDENT_AMBULATORY_CARE_PROVIDER_SITE_OTHER): Payer: Medicare HMO | Admitting: Physician Assistant

## 2017-08-10 VITALS — BP 140/80 | HR 66 | Temp 98.4°F | Resp 16 | Wt 144.0 lb

## 2017-08-10 DIAGNOSIS — E785 Hyperlipidemia, unspecified: Secondary | ICD-10-CM | POA: Diagnosis not present

## 2017-08-10 NOTE — Progress Notes (Signed)
       Patient: Jennifer [name] Female    DOB: 1950/04/08   67 y.o.   MRN: [id number] Visit Date: 08/12/2017  Today's Provider: Trinna Post, PA-C   Chief Complaint  Patient presents with  . Follow-up   Subjective:    HPI Patient here for her 3 month follow-up.   Lipid/Cholesterol, Follow-up:   Last seen for this3 months ago.  Management changes since that visit include Atorvastatin 10mg . . Last Lipid Panel:    Component Value Date/Time   CHOL 197 05/10/2017 1443   TRIG 116 05/10/2017 1443   HDL 99 05/10/2017 1443   CHOLHDL 2.0 05/10/2017 1443   [place] 75 05/10/2017 1443    Risk factors for vascular disease include hypercholesterolemia  She reports excellent compliance with treatment. She is not having side effects.  Current symptoms include none and have been stable. Weight trend: stable Prior visit with dietician: no Current diet: in general, an "unhealthy" diet Current exercise: none  Wt Readings from Last 3 Encounters:  08/10/17 144 lb (65.3 kg)  07/17/17 157 lb 6.5 oz (71.4 kg)  07/13/17 145 lb (65.8 kg)    -------------------------------------------------------------------     No Known Allergies   Current Outpatient Medications:  .  atorvastatin (LIPITOR) 10 MG tablet, TAKE 1 TABLET BY MOUTH ONCE DAILY, Disp: 90 tablet, Rfl: 0  Review of Systems  Cardiovascular: Negative for chest pain, palpitations and leg swelling.    Social History   Tobacco Use  . Smoking status: Current Every Day Smoker    Packs/day: 0.75    Years: 45.00    Pack years: 33.75    Types: Cigarettes  . Smokeless tobacco: Never Used  Substance Use Topics  . Alcohol use: Yes    Alcohol/week: 7.2 oz    Types: 12 Shots of liquor per week    Comment: occasional   Objective:   BP 140/80 (BP Location: Right Arm, Patient Position: Sitting, Cuff Size: Normal)   Pulse 66   Temp 98.4 F (36.9 C) (Oral)   Resp 16   Wt 144 lb (65.3 kg)   SpO2 97%   BMI 20.66 kg/m    Vitals:   08/10/17 1400  BP: 140/80  Pulse: 66  Resp: 16  Temp: 98.4 F (36.9 C)  TempSrc: Oral  SpO2: 97%  Weight: 144 lb (65.3 kg)     Physical Exam  Constitutional: She is oriented to person, place, and time. She appears well-developed and well-nourished.  Cardiovascular: Normal rate and regular rhythm.  Pulmonary/Chest: Effort normal and breath sounds normal.  Neurological: She is alert and oriented to person, place, and time.  Skin: Skin is warm and dry.  Psychiatric: She has a normal mood and affect. Her behavior is normal.        Assessment & Plan:     1. Hyperlipidemia, unspecified hyperlipidemia type  Stable. Needs welcome to Medicare visit.   - DG Bone Density; Future  Return in about 1 month (around 09/10/2017) for welcome to medicare .  The entirety of the information documented in the History of Present Illness, Review of Systems and Physical Exam were personally obtained by me. Portions of this information were initially documented by Joseline ROsas, CMA and reviewed by me for thoroughness and accuracy.           Trinna Post, PA-C  [facility]

## 2017-08-12 NOTE — Patient Instructions (Signed)
Cholesterol Cholesterol is a fat. Your body needs a small amount of cholesterol. Cholesterol (plaque) may build up in your blood vessels (arteries). That makes you more likely to have a heart attack or stroke. You cannot feel your cholesterol level. Having a blood test is the only way to find out if your level is high. Keep your test results. Work with your doctor to keep your cholesterol at a good level. What do the results mean?  Total cholesterol is how much cholesterol is in your blood.  LDL is bad cholesterol. This is the type that can build up. Try to have low LDL.  HDL is good cholesterol. It cleans your blood vessels and carries LDL away. Try to have high HDL.  Triglycerides are fat that the body can store or burn for energy. What are good levels of cholesterol?  Total cholesterol below 200.  LDL below 100 is good for people who have health risks. LDL below 70 is good for people who have very high risks.  HDL above 40 is good. It is best to have HDL of 60 or higher.  Triglycerides below 150. How can I lower my cholesterol? Diet Follow your diet program as told by your doctor.  Choose fish, white meat chicken, or turkey that is roasted or baked. Try not to eat red meat, fried foods, sausage, or lunch meats.  Eat lots of fresh fruits and vegetables.  Choose whole grains, beans, pasta, potatoes, and cereals.  Choose olive oil, corn oil, or canola oil. Only use small amounts.  Try not to eat butter, mayonnaise, shortening, or palm kernel oils.  Try not to eat foods with trans fats.  Choose low-fat or nonfat dairy foods. ? Drink skim or nonfat milk. ? Eat low-fat or nonfat yogurt and cheeses. ? Try not to drink whole milk or cream. ? Try not to eat ice cream, egg yolks, or full-fat cheeses.  Healthy desserts include angel food cake, ginger snaps, animal crackers, hard candy, popsicles, and low-fat or nonfat frozen yogurt. Try not to eat pastries, cakes, pies, and  cookies.  Exercise Follow your exercise program as told by your doctor.  Be more active. Try gardening, walking, and taking the stairs.  Ask your doctor about ways that you can be more active.  Medicine  Take over-the-counter and prescription medicines only as told by your doctor. This information is not intended to replace advice given to you by your health care provider. Make sure you discuss any questions you have with your health care provider. Document Released: 04/10/2008 Document Revised: 08/14/2015 Document Reviewed: 07/25/2015 Elsevier Interactive Patient Education  2018 Elsevier Inc.  

## 2017-09-06 DIAGNOSIS — L570 Actinic keratosis: Secondary | ICD-10-CM | POA: Diagnosis not present

## 2017-09-06 DIAGNOSIS — L820 Inflamed seborrheic keratosis: Secondary | ICD-10-CM | POA: Diagnosis not present

## 2017-09-06 DIAGNOSIS — Z85828 Personal history of other malignant neoplasm of skin: Secondary | ICD-10-CM | POA: Diagnosis not present

## 2017-09-07 ENCOUNTER — Ambulatory Visit
Admission: RE | Admit: 2017-09-07 | Discharge: 2017-09-07 | Disposition: A | Discharge status: Home / Self Care | Payer: Medicare HMO | Source: Ambulatory Visit | Attending: Physician Assistant | Admitting: Physician Assistant

## 2017-09-07 DIAGNOSIS — M810 Age-related osteoporosis without current pathological fracture: Secondary | ICD-10-CM | POA: Insufficient documentation

## 2017-09-07 DIAGNOSIS — Z780 Asymptomatic menopausal state: Secondary | ICD-10-CM | POA: Diagnosis not present

## 2017-09-07 DIAGNOSIS — E785 Hyperlipidemia, unspecified: Secondary | ICD-10-CM | POA: Diagnosis not present

## 2017-09-07 DIAGNOSIS — M818 Other osteoporosis without current pathological fracture: Secondary | ICD-10-CM | POA: Diagnosis not present

## 2017-09-10 ENCOUNTER — Telehealth: Payer: Self-pay

## 2017-09-10 NOTE — Telephone Encounter (Signed)
Pt advised.   Thanks,   -Karmin Kasprzak  

## 2017-09-10 NOTE — Telephone Encounter (Signed)
-----   Message from Trinna Post, [place] sent at 09/07/2017  2:56 PM EDT ----- Bone density scan shows osteoporosis. This is weakening of the bones that generally requires treatment. We can talk about treating this at her follow up visit in September.

## 2017-10-04 DIAGNOSIS — L821 Other seborrheic keratosis: Secondary | ICD-10-CM | POA: Diagnosis not present

## 2017-10-04 DIAGNOSIS — Z85828 Personal history of other malignant neoplasm of skin: Secondary | ICD-10-CM | POA: Diagnosis not present

## 2017-10-04 DIAGNOSIS — L814 Other melanin hyperpigmentation: Secondary | ICD-10-CM | POA: Diagnosis not present

## 2017-10-04 DIAGNOSIS — L820 Inflamed seborrheic keratosis: Secondary | ICD-10-CM | POA: Diagnosis not present

## 2017-10-19 ENCOUNTER — Other Ambulatory Visit: Payer: Self-pay | Admitting: Physician Assistant

## 2017-10-19 DIAGNOSIS — E7800 Pure hypercholesterolemia, unspecified: Secondary | ICD-10-CM

## 2017-10-22 ENCOUNTER — Ambulatory Visit: Payer: Self-pay | Admitting: Physician Assistant

## 2017-10-27 ENCOUNTER — Ambulatory Visit: Payer: Self-pay | Admitting: Physician Assistant

## 2017-11-02 ENCOUNTER — Encounter: Payer: Self-pay | Admitting: Physician Assistant

## 2017-11-02 ENCOUNTER — Ambulatory Visit (INDEPENDENT_AMBULATORY_CARE_PROVIDER_SITE_OTHER): Payer: Medicare HMO | Admitting: Physician Assistant

## 2017-11-02 VITALS — BP 128/90 | HR 54 | Temp 98.1°F | Resp 16 | Ht 70.0 in | Wt 153.0 lb

## 2017-11-02 DIAGNOSIS — Z0000 Encounter for general adult medical examination without abnormal findings: Secondary | ICD-10-CM

## 2017-11-02 DIAGNOSIS — Z1239 Encounter for other screening for malignant neoplasm of breast: Secondary | ICD-10-CM | POA: Diagnosis not present

## 2017-11-02 DIAGNOSIS — Z1159 Encounter for screening for other viral diseases: Secondary | ICD-10-CM | POA: Diagnosis not present

## 2017-11-02 DIAGNOSIS — F32A Depression, unspecified: Secondary | ICD-10-CM

## 2017-11-02 DIAGNOSIS — R079 Chest pain, unspecified: Secondary | ICD-10-CM

## 2017-11-02 DIAGNOSIS — Z114 Encounter for screening for human immunodeficiency virus [HIV]: Secondary | ICD-10-CM

## 2017-11-02 DIAGNOSIS — Z131 Encounter for screening for diabetes mellitus: Secondary | ICD-10-CM

## 2017-11-02 DIAGNOSIS — F329 Major depressive disorder, single episode, unspecified: Secondary | ICD-10-CM

## 2017-11-02 DIAGNOSIS — E785 Hyperlipidemia, unspecified: Secondary | ICD-10-CM | POA: Diagnosis not present

## 2017-11-02 DIAGNOSIS — J439 Emphysema, unspecified: Secondary | ICD-10-CM | POA: Diagnosis not present

## 2017-11-02 DIAGNOSIS — M810 Age-related osteoporosis without current pathological fracture: Secondary | ICD-10-CM

## 2017-11-02 DIAGNOSIS — Z23 Encounter for immunization: Secondary | ICD-10-CM

## 2017-11-02 DIAGNOSIS — J449 Chronic obstructive pulmonary disease, unspecified: Secondary | ICD-10-CM | POA: Insufficient documentation

## 2017-11-02 MED ORDER — ALENDRONATE SODIUM 70 MG PO TABS: 70.0000 mg | ORAL_TABLET | ORAL | 0 refills | Status: DC

## 2017-11-02 NOTE — Progress Notes (Signed)
Patient: Jennifer [name], Female    DOB: 12/22/1950, 67 y.o.   MRN: [id number] Visit Date: 11/03/2017  Today's Provider: Trinna Post, PA-C   Chief Complaint  Patient presents with  . Medicare Wellness   Subjective:    Annual wellness visit Jennifer [name] is a 67 y.o. female. She feels well. She reports exercising none. She reports she is sleeping fairly well.  BP Readings from Last 3 Encounters:  11/02/17 128/90  08/10/17 140/80  07/19/17 121/67   DEXA: 09/07/2017 showed T score -2.8.  Influenza: Due today Pneumonia: Prevnar given 03/29/2017, due for Pneumovax 03/30/2018 Tdap: declined Mammo: not yet schedulred Colonoscopy 03/29/2017 found multiple large polyps and she underwent right hemicolectomy.  PAP smear: not indicated, she has had complete hysterectomy   She reports chest pain intermittently. It can happen anytime and is not particularly associated with activity. It can be when she is sitting, chest pain in the center of her chest for a minute. She denies nausea, vomiting, heart burn. She denies radiation of pain.   She also reports falling onto a log one month ago and having a wound that continues to heal .  She continues to smoke. She reports stopping alcohol.    -----------------------------------------------------------   Review of Systems  Constitutional: Negative.   HENT: Negative.   Eyes: Negative.   Cardiovascular: Positive for chest pain.  Gastrointestinal: Negative.   Genitourinary: Negative.   Musculoskeletal: Negative.   Skin: Positive for wound.  Allergic/Immunologic: Negative.   Neurological: Positive for headaches.  Hematological: Negative.   Psychiatric/Behavioral: Negative.     Social History   Socioeconomic History  . Marital status: Single    Spouse name: Not on file  . Number of children: Not on file  . Years of education: Not on file  . Highest education level: Not on file  Occupational History  . Not on file  Social Needs    . Financial resource strain: Not on file  . Food insecurity:    Worry: Not on file    Inability: Not on file  . Transportation needs:    Medical: Not on file    Non-medical: Not on file  Tobacco Use  . Smoking status: Current Every Day Smoker    Packs/day: 0.75    Years: 45.00    Pack years: 33.75    Types: Cigarettes  . Smokeless tobacco: Never Used  Substance and Sexual Activity  . Alcohol use: Yes    Alcohol/week: 12.0 standard drinks    Types: 12 Shots of liquor per week    Comment: occasional  . Drug use: No  . Sexual activity: Not Currently  Lifestyle  . Physical activity:    Days per week: Not on file    Minutes per session: Not on file  . Stress: Not on file  Relationships  . Social connections:    Talks on phone: Not on file    Gets together: Not on file    Attends religious service: Not on file    Active member of club or organization: Not on file    Attends meetings of clubs or organizations: Not on file    Relationship status: Not on file  . Intimate partner violence:    Fear of current or ex partner: Not on file    Emotionally abused: Not on file    Physically abused: Not on file    Forced sexual activity: Not on file  Other Topics Concern  .  Not on file  Social History Narrative  . Not on file    Past Medical History:  Diagnosis Date  . Arthritis    mild  . Cancer (HCC)    skin cancer leg and chest  . Dysrhythmia    palpitation  . Family history of colon cancer   . Headache    as  a child   . Heart murmur    while pregnant  . Hyperlipidemia 07/17/2017     Patient Active Problem List   Diagnosis Date Noted  . COPD (chronic obstructive pulmonary disease) ([place]) 11/02/2017  . Umbilical hernia s/p primary repair 07/16/2017 07/17/2017  . Hyperlipidemia 07/17/2017  . Arthritis   . Adenomatous polyp of ascending colon s/p right colectomy 07/16/2017 07/16/2017  . Genetic testing 05/13/2017  . History of colonic polyps 05/03/2017  . Family  history of colon cancer   . Special screening for malignant neoplasms, colon   . Tobacco abuse 03/10/2017    Past Surgical History:  Procedure Laterality Date  . ABDOMINAL HYSTERECTOMY     1996  . COLONOSCOPY WITH PROPOFOL N/A 03/29/2017   Procedure: COLONOSCOPY WITH PROPOFOL;  Surgeon: Lin Landsman, MD;  Location: [facility] ENDOSCOPY;  Service: Gastroenterology;  Laterality: N/A;  . HEMICOLECTOMY     righ  t6-21-19  . LAPAROSCOPIC RIGHT HEMI COLECTOMY Right 07/16/2017   Procedure: LAPAROSCOPIC RIGHT HEMI COLECTOMY;  Surgeon: Ileana Roup, MD;  Location: [facility] ORS;  Service: General;  Laterality: Right;  . TUBAL LIGATION      Her family history includes Colon cancer in her maternal grandfather and paternal uncle; Colon cancer (age of onset: 37) in her paternal aunt; Diabetes in her maternal grandmother and mother; Heart attack in her father; Heart disease in her paternal grandmother; Kidney disease in her mother.      Current Outpatient Medications:  .  atorvastatin (LIPITOR) 10 MG tablet, TAKE 1 TABLET BY MOUTH ONCE DAILY, Disp: 90 tablet, Rfl: 1 .  alendronate (FOSAMAX) 70 MG tablet, Take 1 tablet (70 mg total) by mouth every 7 (seven) days. Take with a full glass of water on an empty stomach., Disp: 12 tablet, Rfl: 0  Patient Care Team: Paulene Floor as PCP - General (Physician Assistant)     Objective:   Vitals: BP 128/90 (BP Location: Right Arm, Patient Position: Sitting, Cuff Size: Normal)   Pulse (!) 54   Temp 98.1 F (36.7 C) (Oral)   Resp 16   Ht 5\' 10"  (1.778 m)   Wt 153 lb (69.4 kg)   SpO2 96%   BMI 21.95 kg/m   Physical Exam  Constitutional: She is oriented to person, place, and time. She appears well-developed and well-nourished.  Cardiovascular: Normal rate and regular rhythm.  Pulmonary/Chest: Effort normal and breath sounds normal.  Neurological: She is alert and oriented to person, place, and time.  Skin: Skin is warm and dry.    Psychiatric: She has a normal mood and affect. Her behavior is normal.    Activities of Daily Living In your present state of health, do you have any difficulty performing the following activities: 11/02/2017 07/16/2017  Hearing? Y N  Vision? N N  Difficulty concentrating or making decisions? N N  Walking or climbing stairs? N N  Dressing or bathing? N N  Doing errands, shopping? N N    Fall Risk Assessment Fall Risk  11/02/2017 03/09/2017  Falls in the past year? No No     Depression Screen PHQ 2/9  Scores 11/02/2017 03/09/2017  PHQ - 2 Score 0 3  PHQ- 9 Score 1 6    Cognitive Testing - 6-CIT  Correct? Score   What year is it? yes 0 0 or 4  What month is it? yes 0 0 or 3  Memorize:    Pia Mau,  44,  [address]  [place],      What time is it? (within 1 hour) yes 0 0 or 3  Count backwards from 20 yes 0 0, 2, or 4  Name the months of the year yes 0 0, 2, or 4  Repeat name & address above yes 0 0, 2, 4, 6, 8, or 10       TOTAL SCORE  0/28   Interpretation:  Normal  Normal (0-7) Abnormal (8-28)       Assessment & Plan:     Annual Wellness Visit  Reviewed patient's Family Medical History Reviewed and updated list of patient's medical providers Assessment of cognitive impairment was done Assessed patient's functional ability Established a written schedule for health screening [place] Completed and Reviewed  Exercise Activities and Dietary recommendations Goals   None     Immunization History  Administered Date(s) Administered  . Influenza, High Dose Seasonal PF 11/02/2017  . Pneumococcal Conjugate-13 03/25/2017    Health Maintenance  Topic Date Due  . MAMMOGRAM  02/05/2002  . Hepatitis C Screening  03/09/2018 (Originally December 15, 1950)  . HIV Screening  03/09/2018 (Originally 02/06/1967)  . TETANUS/TDAP  03/10/2018 (Originally 02/06/1971)  . PNA vac Low Risk Adult (2 of 2 - PPSV23) 03/25/2018  . COLONOSCOPY  03/30/2018  . INFLUENZA  VACCINE  Completed  . DEXA SCAN  Completed     Discussed health benefits of physical activity, and encouraged her to engage in regular exercise appropriate for her age and condition.    1. Medicare annual wellness visit, initial  - EKG 12-Lead  2. Need for vaccination against Streptococcus pneumoniae  Due for pneumovax in 03/2018.  3. Need for influenza vaccination  - Flu vaccine HIGH DOSE PF  4. Hyperlipidemia, unspecified hyperlipidemia type  - CBC with Differential/Platelet - Comprehensive metabolic panel - Lipid Panel With LDL/HDL Ratio  5. Diabetes mellitus screening  - Comprehensive metabolic panel - Hemoglobin A1c  6. Depression, unspecified depression type  - TSH  7. Screening for breast cancer  - MM 3D SCREEN BREAST BILATERAL; Future  8. Encounter for screening for HIV  - HIV antibody (with reflex)  9. Need for hepatitis C screening test  - Hepatitis c antibody (reflex)  10. Pulmonary emphysema, unspecified emphysema type ([place])   11. Osteoporosis, unspecified osteoporosis type, unspecified pathological fracture presence  - alendronate (FOSAMAX) 70 MG tablet; Take 1 tablet (70 mg total) by mouth every 7 (seven) days. Take with a full glass of water on an empty stomach.  Dispense: 12 tablet; Refill: 0  12. Chest Pain  Her symptoms are not entirely convincing of cardiac chest pain however she is 67, has smoked and continues to smoke, and had largely untreated HLD until one year ago. I would like her to be evaluated by a cardiologist.   Return in about 6 months (around 05/04/2018).  The entirety of the information documented in the History of Present Illness, Review of Systems and Physical Exam were personally obtained by me. Portions of this information were initially documented by Lynford Humphrey, CMA and reviewed by me for thoroughness and accuracy.    ------------------------------------------------------------------------------------------------------------  Trinna Post, PA-C  [facility]

## 2017-11-03 LAB — HIV ANTIBODY (ROUTINE TESTING W REFLEX): HIV Screen 4th Generation wRfx: NONREACTIVE

## 2017-11-03 LAB — HEPATITIS C ANTIBODY (REFLEX): HCV Ab: <0.1 s/co ratio (ref 0.0–0.9)

## 2017-11-03 LAB — HCV COMMENT:

## 2017-11-03 NOTE — Patient Instructions (Signed)

## 2017-11-04 ENCOUNTER — Ambulatory Visit: Payer: Self-pay | Admitting: Physician Assistant

## 2017-11-11 ENCOUNTER — Telehealth: Payer: Self-pay

## 2017-11-11 NOTE — Telephone Encounter (Signed)
-----   Message from Trinna Post, [place] sent at 11/09/2017  8:19 AM EDT ----- HIV and HCV negative. Where are the rest of her labs?

## 2017-11-11 NOTE — Telephone Encounter (Signed)
[email or web address]

## 2017-11-11 NOTE — Telephone Encounter (Signed)
The rest of her labs were not done. They are still active.

## 2017-12-25 DIAGNOSIS — J0100 Acute maxillary sinusitis, unspecified: Secondary | ICD-10-CM | POA: Diagnosis not present

## 2017-12-31 NOTE — Progress Notes (Signed)
Patient: Jennifer [name] Female    DOB: 1950/03/30   67 y.o.   MRN: [id number] Visit Date: 01/03/2018  Today's Provider: Trinna Post, PA-C   Chief Complaint  Patient presents with  . Follow-up    Labs and Cholesterol   Subjective:    HPI  Lipid/Cholesterol, Follow-up:   Last seen for this2 months ago.  Management changes since that visit include none. She reports her diet has been worse and she hasn't been very consistent with taking her lipitor 10 mg daily. She misses several days a week.  . Last Lipid Panel:    Component Value Date/Time   CHOL 197 05/10/2017 1443   TRIG 116 05/10/2017 1443   HDL 99 05/10/2017 1443   CHOLHDL 2.0 05/10/2017 1443   Ragland 75 05/10/2017 1443    Risk factors for vascular disease include hypercholesterolemia  She reports excellent compliance with treatment. She is not having side effects.  Current symptoms include none and have been stable. Weight trend: stable Prior visit with dietician: no Current diet: in general, an "unhealthy" diet Current exercise: none  Wt Readings from Last 3 Encounters:  01/03/18 157 lb 3.2 oz (71.3 kg)  11/02/17 153 lb (69.4 kg)  08/10/17 144 lb (65.3 kg)    Osteoporosis: She had a DEXA scan on 09/07/2017 which revealed osteoporosis. She was started on fosamax 70 mg once weekly and she says she is tolerating it well. She is not having any trouble swallowing.   HTN: Her readings have been slightly elevated for the past several months. She denies ever being treated for HTN. She denies chest pain and SOB.   BP Readings from Last 3 Encounters:  01/03/18 (!) 160/75  11/02/17 128/90  08/10/17 140/80    -------------------------------------------------------------------     No Known Allergies   Current Outpatient Medications:  .  alendronate (FOSAMAX) 70 MG tablet, Take 1 tablet (70 mg total) by mouth every 7 (seven) days. Take with a full glass of water on an empty stomach., Disp: 12 tablet,  Rfl: 0 .  atorvastatin (LIPITOR) 10 MG tablet, TAKE 1 TABLET BY MOUTH ONCE DAILY, Disp: 90 tablet, Rfl: 1  Review of Systems  Social History   Tobacco Use  . Smoking status: Current Every Day Smoker    Packs/day: 0.75    Years: 45.00    Pack years: 33.75    Types: Cigarettes  . Smokeless tobacco: Never Used  Substance Use Topics  . Alcohol use: Yes    Alcohol/week: 12.0 standard drinks    Types: 12 Shots of liquor per week    Comment: occasional   Objective:   BP (!) 160/75 (BP Location: Left Arm, Patient Position: Sitting, Cuff Size: Large)   Pulse 66   Temp 98.3 F (36.8 C) (Oral)   Resp 16   Wt 157 lb 3.2 oz (71.3 kg)   BMI 22.56 kg/m  Vitals:   01/03/18 1329  BP: (!) 160/75  Pulse: 66  Resp: 16  Temp: 98.3 F (36.8 C)  TempSrc: Oral  Weight: 157 lb 3.2 oz (71.3 kg)     Physical Exam  Constitutional: She is oriented to person, place, and time. She appears well-developed and well-nourished.  Cardiovascular: Normal rate and regular rhythm.  Pulmonary/Chest: Effort normal and breath sounds normal.  Neurological: She is alert and oriented to person, place, and time.  Skin: Skin is warm and dry.  Psychiatric: She has a normal mood and affect. Her behavior is  normal.        Assessment & Plan:     1. Hyperlipidemia, unspecified hyperlipidemia type  Check lipid profile.   - Lipid Profile  2. Essential hypertension  Start amlodipine 5 mg and follow up 1-2 months.   - Comprehensive Metabolic Panel (CMET)  3. Osteoporosis, unspecified osteoporosis type, unspecified pathological fracture presence  Continue fosamax. Recheck DEXA in 2 years.  4. Hypertension, unspecified type  - amLODipine (NORVASC) 5 MG tablet; Take 1 tablet (5 mg total) by mouth daily.  Dispense: 90 tablet; Refill: 0  The entirety of the information documented in the History of Present Illness, Review of Systems and Physical Exam were personally obtained by me. Portions of this  information were initially documented by Lyndel Pleasure, CMA and reviewed by me for thoroughness and accuracy.            Trinna Post, PA-C  [facility]

## 2018-01-03 ENCOUNTER — Ambulatory Visit (INDEPENDENT_AMBULATORY_CARE_PROVIDER_SITE_OTHER): Payer: Medicare HMO | Admitting: Physician Assistant

## 2018-01-03 ENCOUNTER — Encounter: Payer: Self-pay | Admitting: Physician Assistant

## 2018-01-03 VITALS — BP 160/75 | HR 66 | Temp 98.3°F | Resp 16 | Wt 157.2 lb

## 2018-01-03 DIAGNOSIS — M810 Age-related osteoporosis without current pathological fracture: Secondary | ICD-10-CM

## 2018-01-03 DIAGNOSIS — I10 Essential (primary) hypertension: Secondary | ICD-10-CM | POA: Diagnosis not present

## 2018-01-03 DIAGNOSIS — E785 Hyperlipidemia, unspecified: Secondary | ICD-10-CM

## 2018-01-03 MED ORDER — AMLODIPINE BESYLATE 5 MG PO TABS: 5.0000 mg | ORAL_TABLET | Freq: Every day | ORAL | 0 refills | Status: DC

## 2018-01-04 ENCOUNTER — Telehealth: Payer: Self-pay

## 2018-01-04 LAB — COMPREHENSIVE METABOLIC PANEL
ALT: 17 IU/L (ref 0–32)
AST: 28 IU/L (ref 0–40)
Albumin/Globulin Ratio: 2.2 (ref 1.2–2.2)
Albumin: 4.7 g/dL (ref 3.6–4.8)
Alkaline Phosphatase: 83 IU/L (ref 39–117)
BUN/Creatinine Ratio: 9 — ABNORMAL LOW (ref 12–28)
BUN: 8 mg/dL (ref 8–27)
Bilirubin Total: 0.4 mg/dL (ref 0.0–1.2)
CO2: 23 mmol/L (ref 20–29)
Calcium: 9.4 mg/dL (ref 8.7–10.3)
Chloride: 102 mmol/L (ref 96–106)
Creatinine, Ser: 0.88 mg/dL (ref 0.57–1.00)
GFR calc Af Amer: 80 mL/min/{1.73_m2} (ref >59)
GFR calc non Af Amer: 69 mL/min/{1.73_m2} (ref >59)
Globulin, Total: 2.1 g/dL (ref 1.5–4.5)
Glucose: 58 mg/dL — ABNORMAL LOW (ref 65–99)
Potassium: 4.4 mmol/L (ref 3.5–5.2)
Sodium: 140 mmol/L (ref 134–144)
Total Protein: 6.8 g/dL (ref 6.0–8.5)

## 2018-01-04 LAB — LIPID PANEL
Chol/HDL Ratio: 2.3 ratio (ref 0.0–4.4)
Cholesterol, Total: 215 mg/dL — ABNORMAL HIGH (ref 100–199)
HDL: 94 mg/dL (ref >39)
LDL Calculated: 98 mg/dL (ref 0–99)
Triglycerides: 116 mg/dL (ref 0–149)
VLDL Cholesterol Cal: 23 mg/dL (ref 5–40)

## 2018-01-04 NOTE — Telephone Encounter (Signed)
Patient returned your call. KW 

## 2018-01-04 NOTE — Telephone Encounter (Signed)
-----   Message from Trinna Post, [place] sent at 01/04/2018 10:22 AM EST ----- Cholesterol is a little worse but still technically controlled. Patient reports she hasn't been taking this consistently and so she should try her best to remember every night. Metabolic panel looks OK, kidney function and liver function normal.

## 2018-01-04 NOTE — Patient Instructions (Signed)

## 2018-01-04 NOTE — Telephone Encounter (Signed)
LVMTRC 

## 2018-01-04 NOTE — Telephone Encounter (Signed)
Patient was advised.  

## 2018-01-05 ENCOUNTER — Ambulatory Visit: Payer: Medicare HMO | Admitting: Cardiovascular Disease

## 2018-01-05 ENCOUNTER — Encounter

## 2018-01-25 ENCOUNTER — Other Ambulatory Visit: Payer: Self-pay | Admitting: Physician Assistant

## 2018-01-25 DIAGNOSIS — M810 Age-related osteoporosis without current pathological fracture: Secondary | ICD-10-CM

## 2018-02-21 DIAGNOSIS — L820 Inflamed seborrheic keratosis: Secondary | ICD-10-CM | POA: Diagnosis not present

## 2018-02-21 DIAGNOSIS — L814 Other melanin hyperpigmentation: Secondary | ICD-10-CM | POA: Diagnosis not present

## 2018-02-21 DIAGNOSIS — L821 Other seborrheic keratosis: Secondary | ICD-10-CM | POA: Diagnosis not present

## 2018-02-21 DIAGNOSIS — D489 Neoplasm of uncertain behavior, unspecified: Secondary | ICD-10-CM | POA: Diagnosis not present

## 2018-02-21 DIAGNOSIS — L570 Actinic keratosis: Secondary | ICD-10-CM | POA: Diagnosis not present

## 2018-02-25 ENCOUNTER — Telehealth: Payer: Self-pay

## 2018-02-25 NOTE — Telephone Encounter (Signed)
Call pt regarding lung screening. Pt is a current smoker, smoking about 1 pack per day. Pt would like scan to be any afternoon except Feb. 10 th. Pt denies any new health issues at this time.

## 2018-03-01 ENCOUNTER — Telehealth: Payer: Self-pay | Admitting: *Deleted

## 2018-03-01 ENCOUNTER — Encounter: Payer: Self-pay | Admitting: *Deleted

## 2018-03-01 DIAGNOSIS — Z122 Encounter for screening for malignant neoplasm of respiratory organs: Secondary | ICD-10-CM

## 2018-03-01 NOTE — Telephone Encounter (Signed)
Patient has been notified that the annual lung cancer screening low dose CT scan is due currently or will be in the near future.  Confirmed that the patient is within the age range of 40-80, and asymptomatic, and currently exhibits no signs or symptoms of lung cancer.  Patient denies illness that would prevent curative treatment for lung cancer if found.  Verified smoking history, current smoker 1 ppd with 34.75pkyr hx .  The shared decision making visit was completed on 03-11-17.  Patient is agreeable for the CT scan to be scheduled.  Will call patient back with date and time of appointment.

## 2018-03-01 NOTE — Telephone Encounter (Signed)
Called pt r/t appt for ldct screening on Monday 03/14/2018 here @ [facility] @ 3:10pm., message left appt mailed

## 2018-03-07 ENCOUNTER — Ambulatory Visit (INDEPENDENT_AMBULATORY_CARE_PROVIDER_SITE_OTHER): Payer: Medicare HMO | Admitting: Physician Assistant

## 2018-03-07 ENCOUNTER — Encounter: Payer: Self-pay | Admitting: Physician Assistant

## 2018-03-07 VITALS — BP 120/70 | HR 64 | Temp 98.3°F | Resp 16 | Wt 148.2 lb

## 2018-03-07 DIAGNOSIS — E785 Hyperlipidemia, unspecified: Secondary | ICD-10-CM | POA: Diagnosis not present

## 2018-03-07 DIAGNOSIS — Z720 Tobacco use: Secondary | ICD-10-CM

## 2018-03-07 DIAGNOSIS — I10 Essential (primary) hypertension: Secondary | ICD-10-CM

## 2018-03-07 MED ORDER — VARENICLINE TARTRATE 0.5 MG X 11 & 1 MG X 42 PO MISC: ORAL | 0 refills | Status: DC

## 2018-03-07 MED ORDER — LISINOPRIL-HYDROCHLOROTHIAZIDE 10-12.5 MG PO TABS: 1.0000 | ORAL_TABLET | Freq: Every day | ORAL | 0 refills | Status: DC

## 2018-03-07 NOTE — Patient Instructions (Signed)

## 2018-03-07 NOTE — Progress Notes (Signed)
Patient: Jennifer [name] Female    DOB: 10-Mar-1950   68 y.o.   MRN: [id number] Visit Date: 03/07/2018  Today's Provider: Trinna Post, PA-C   Chief Complaint  Patient presents with  . Follow-up   Subjective:   HPI  Follow up for hypertension  The patient was last seen for this 2 months ago. Changes made at last visit include start amlodipine 5 mg amlodipine.   She reports excellent compliance with treatment. She feels that condition is Improved. She is having ankle edema. This bothers her greatly. She would like to change medication.   BP Readings from Last 3 Encounters:  03/07/18 120/70  01/03/18 (!) 160/75  11/02/17 128/90   Tobacco Abuse: Has tried and failed wellbutrin. Has been hypnotized before with some success. She is dating a new person now and she does not want to smell like smoke and is ready to quit.   HLD: Continues to take lipitor 10 mg without issue.   Lipid Panel     Component Value Date/Time   CHOL 215 (H) 01/03/2018 1407   TRIG 116 01/03/2018 1407   HDL 94 01/03/2018 1407   CHOLHDL 2.3 01/03/2018 1407   LDLCALC 98 01/03/2018 1407    ------------------------------------------------------------------------------------   No Known Allergies   Current Outpatient Medications:  .  alendronate (FOSAMAX) 70 MG tablet, TAKE 1 TABLET BY MOUTH EVERY 7 DAYS. TAKE ON AN EMPTY STOMACH WITH A FULL GLASS OF WATER, Disp: 12 tablet, Rfl: 0 .  amLODipine (NORVASC) 5 MG tablet, Take 1 tablet (5 mg total) by mouth daily., Disp: 90 tablet, Rfl: 0 .  atorvastatin (LIPITOR) 10 MG tablet, TAKE 1 TABLET BY MOUTH ONCE DAILY, Disp: 90 tablet, Rfl: 1  Review of Systems  Constitutional: Negative.   Respiratory: Negative.   Cardiovascular: Negative.     Social History   Tobacco Use  . Smoking status: Current Every Day Smoker    Packs/day: 0.75    Years: 46.35    Pack years: 34.76    Types: Cigarettes  . Smokeless tobacco: Never Used  Substance Use Topics    . Alcohol use: Yes    Alcohol/week: 12.0 standard drinks    Types: 12 Shots of liquor per week    Comment: occasional      Objective:   BP 120/70 (BP Location: Left Arm, Patient Position: Sitting, Cuff Size: Normal)   Pulse 64   Temp 98.3 F (36.8 C) (Oral)   Resp 16   Wt 148 lb 3.2 oz (67.2 kg)   BMI 21.26 kg/m  Vitals:   03/07/18 1324  BP: 120/70  Pulse: 64  Resp: 16  Temp: 98.3 F (36.8 C)  TempSrc: Oral  Weight: 148 lb 3.2 oz (67.2 kg)     Physical Exam Constitutional:      Appearance: Normal appearance.  Cardiovascular:     Rate and Rhythm: Normal rate and regular rhythm.     Pulses: Normal pulses.     Heart sounds: Normal heart sounds.  Pulmonary:     Breath sounds: Normal breath sounds.  Skin:    General: Skin is warm and dry.  Neurological:     Mental Status: She is alert and oriented to person, place, and time. Mental status is at baseline.  Psychiatric:        Mood and Affect: Mood normal.        Behavior: Behavior normal.         Assessment & Plan  1. Essential hypertension  Change from amlodipine 5 mg daily to Lisinopril-HCTZ 10-12.5 mg daily. Follow up in one month for BP recheck and labs.   - lisinopril-hydrochlorothiazide (PRINZIDE,ZESTORETIC) 10-12.5 MG tablet; Take 1 tablet by mouth daily.  Dispense: 90 tablet; Refill: 0  2. Tobacco abuse  I have counseled >3 min and will start chantix as below.   - varenicline (CHANTIX STARTING MONTH PAK) 0.5 MG X 11 & 1 MG X 42 tablet; 0.5 mg tablet by mouth once daily for 3 days, then 0.5 mg tablet twice daily for 4 days, then increase to one 1 mg tablet twice daily.  Dispense: 53 tablet; Refill: 0   3. Hyperlipidemia  Continue lipitor 10 mg.   Return in about 1 month (around 04/05/2018) for HTN .  The entirety of the information documented in the History of Present Illness, Review of Systems and Physical Exam were personally obtained by me. Portions of this information were initially  documented by Lynford Humphrey, CMA and reviewed by me for thoroughness and accuracy.        Trinna Post, PA-C  [facility]

## 2018-03-10 ENCOUNTER — Telehealth: Payer: Self-pay

## 2018-03-10 NOTE — Telephone Encounter (Signed)
Patient was started on new BP medicine yesterday.  Today she feels like she is hyperventilating when she walks a little distance. She states that then she will get dizzy.  She does not get dizzy when she goes from sitting to standing.   CB# [phone]

## 2018-03-11 ENCOUNTER — Telehealth: Payer: Self-pay

## 2018-03-11 DIAGNOSIS — I10 Essential (primary) hypertension: Secondary | ICD-10-CM

## 2018-03-11 MED ORDER — METOPROLOL TARTRATE 25 MG PO TABS: 25.0000 mg | ORAL_TABLET | Freq: Two times a day (BID) | ORAL | 0 refills | Status: DC

## 2018-03-11 NOTE — Telephone Encounter (Signed)
    DesantoJiles Garter D, CMA 19 hours ago (4:28 PM)      Patient was started on new BP medicine yesterday.  Today she feels like she is hyperventilating when she walks a little distance. She states that then she will get dizzy.  She does not get dizzy when she goes from sitting to standing.   CB# [phone]

## 2018-03-11 NOTE — Telephone Encounter (Signed)
This really doesn't sound like an allergic reaction. But I will change the medication to metoprolol tartrate 25 mg BID.

## 2018-03-12 NOTE — Telephone Encounter (Signed)
[email or web address]

## 2018-03-14 ENCOUNTER — Ambulatory Visit: Admission: RE | Admit: 2018-03-14 | Payer: Medicare HMO | Source: Ambulatory Visit

## 2018-03-14 NOTE — Telephone Encounter (Signed)
Patient advised as below. Patient verbalizes understanding and is in agreement with treatment plan.  

## 2018-03-15 ENCOUNTER — Ambulatory Visit: Payer: Medicare HMO | Admitting: Cardiovascular Disease

## 2018-03-31 ENCOUNTER — Encounter: Payer: Self-pay | Admitting: *Deleted

## 2018-04-05 NOTE — Progress Notes (Deleted)
       Patient: Jennifer [name] Female    DOB: 05/23/1950   68 y.o.   MRN: [id number] Visit Date: 04/05/2018  Today's Provider: Trinna Post, PA-C   No chief complaint on file.  Subjective:     HPI Hypertension: Patient is here for 1 month follow up.Changes include change from amlodipine 5 mg daily to Lisinopril-HCTZ 10-12.5 mg daily Cardiac symptoms {Symptoms; cardiac:12860}. Patient denies {Symptoms; cardiac:12860}.  Cardiovascular risk factors: advanced age (older than 4 for men, 36 for women), dyslipidemia, hypertension and smoking/ tobacco exposure.   No Known Allergies   Current Outpatient Medications:  .  alendronate (FOSAMAX) 70 MG tablet, TAKE 1 TABLET BY MOUTH EVERY 7 DAYS. TAKE ON AN EMPTY STOMACH WITH A FULL GLASS OF WATER, Disp: 12 tablet, Rfl: 0 .  atorvastatin (LIPITOR) 10 MG tablet, TAKE 1 TABLET BY MOUTH ONCE DAILY, Disp: 90 tablet, Rfl: 1 .  metoprolol tartrate (LOPRESSOR) 25 MG tablet, Take 1 tablet (25 mg total) by mouth 2 (two) times daily., Disp: 180 tablet, Rfl: 0 .  varenicline (CHANTIX STARTING MONTH PAK) 0.5 MG X 11 & 1 MG X 42 tablet, 0.5 mg tablet by mouth once daily for 3 days, then 0.5 mg tablet twice daily for 4 days, then increase to one 1 mg tablet twice daily., Disp: 53 tablet, Rfl: 0  Review of Systems  Social History   Tobacco Use  . Smoking status: Current Every Day Smoker    Packs/day: 0.75    Years: 46.35    Pack years: 34.76    Types: Cigarettes  . Smokeless tobacco: Never Used  Substance Use Topics  . Alcohol use: Yes    Alcohol/week: 12.0 standard drinks    Types: 12 Shots of liquor per week    Comment: occasional      Objective:   There were no vitals taken for this visit. There were no vitals filed for this visit.   Physical Exam      Assessment & Plan        Trinna Post, PA-C  [facility]

## 2018-04-06 ENCOUNTER — Ambulatory Visit: Payer: Self-pay | Admitting: Physician Assistant

## 2018-04-07 ENCOUNTER — Encounter: Payer: Self-pay | Admitting: Physician Assistant

## 2018-04-07 ENCOUNTER — Other Ambulatory Visit: Payer: Self-pay

## 2018-04-07 ENCOUNTER — Ambulatory Visit (INDEPENDENT_AMBULATORY_CARE_PROVIDER_SITE_OTHER): Payer: Medicare HMO | Admitting: Physician Assistant

## 2018-04-07 VITALS — BP 104/67 | HR 70 | Temp 97.9°F | Resp 16 | Wt 140.8 lb

## 2018-04-07 DIAGNOSIS — E785 Hyperlipidemia, unspecified: Secondary | ICD-10-CM

## 2018-04-07 DIAGNOSIS — F4321 Adjustment disorder with depressed mood: Secondary | ICD-10-CM

## 2018-04-07 DIAGNOSIS — I10 Essential (primary) hypertension: Secondary | ICD-10-CM | POA: Diagnosis not present

## 2018-04-07 MED ORDER — SERTRALINE HCL 50 MG PO TABS: 50.0000 mg | ORAL_TABLET | Freq: Every day | ORAL | 0 refills | Status: DC
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
## 2018-04-07 NOTE — Patient Instructions (Signed)

## 2018-04-08 ENCOUNTER — Telehealth: Payer: Self-pay | Admitting: *Deleted

## 2018-04-08 NOTE — Telephone Encounter (Signed)
Attempted to contact patient r/t LDCT Screening follow up due at this time.  No answer received, message left for patient to call [phone] to schedule appointment.    

## 2018-04-14 ENCOUNTER — Encounter: Payer: Self-pay | Admitting: *Deleted

## 2018-05-31 ENCOUNTER — Other Ambulatory Visit: Payer: Self-pay | Admitting: Physician Assistant

## 2018-05-31 DIAGNOSIS — M810 Age-related osteoporosis without current pathological fracture: Secondary | ICD-10-CM

## 2018-06-07 ENCOUNTER — Ambulatory Visit: Payer: Self-pay | Admitting: Physician Assistant

## 2018-06-07 NOTE — Progress Notes (Deleted)
       Patient: Jennifer [name] Female    DOB: [birth year]   68 y.o.   MRN: [id number] Visit Date: 06/07/2018  Today's Provider: Trinna Post, PA-C   No chief complaint on file.  Subjective:     HPI  No Known Allergies   Current Outpatient Medications:  .  alendronate (FOSAMAX) 70 MG tablet, TAKE 1 TABLET BY MOUTH ONCE A WEEK ON AN EMPTY STOMACH WITH  A  FULL  GLASS  OF  WATER, Disp: 12 tablet, Rfl: 0 .  atorvastatin (LIPITOR) 10 MG tablet, TAKE 1 TABLET BY MOUTH ONCE DAILY, Disp: 90 tablet, Rfl: 1 .  metoprolol tartrate (LOPRESSOR) 25 MG tablet, Take 1 tablet (25 mg total) by mouth 2 (two) times daily., Disp: 180 tablet, Rfl: 0 .  sertraline (ZOLOFT) 50 MG tablet, Take 1 tablet (50 mg total) by mouth daily., Disp: 90 tablet, Rfl: 0 .  varenicline (CHANTIX STARTING MONTH PAK) 0.5 MG X 11 & 1 MG X 42 tablet, 0.5 mg tablet by mouth once daily for 3 days, then 0.5 mg tablet twice daily for 4 days, then increase to one 1 mg tablet twice daily., Disp: 53 tablet, Rfl: 0  Review of Systems  Social History   Tobacco Use  . Smoking status: Current Every Day Smoker    Packs/day: 0.75    Years: 46.35    Pack years: 34.76    Types: Cigarettes  . Smokeless tobacco: Never Used  Substance Use Topics  . Alcohol use: Yes    Alcohol/week: 12.0 standard drinks    Types: 12 Shots of liquor per week    Comment: occasional      Objective:   There were no vitals taken for this visit. There were no vitals filed for this visit.   Physical Exam      Assessment & Plan        Trinna Post, PA-C  [facility]

## 2018-06-23 ENCOUNTER — Ambulatory Visit (INDEPENDENT_AMBULATORY_CARE_PROVIDER_SITE_OTHER): Payer: Medicare HMO | Admitting: Physician Assistant

## 2018-06-23 DIAGNOSIS — G2581 Restless legs syndrome: Secondary | ICD-10-CM

## 2018-06-23 DIAGNOSIS — F4320 Adjustment disorder, unspecified: Secondary | ICD-10-CM

## 2018-06-23 MED ORDER — ROPINIROLE HCL 0.5 MG PO TABS: 0.5000 mg | ORAL_TABLET | Freq: Every day | ORAL | 0 refills | Status: DC

## 2018-06-23 NOTE — Progress Notes (Signed)
Subjective:    Patient ID: Jennifer [name], female    DOB: [birth year], 68 y.o.   MRN: [id number]  Jennifer [name] is a 68 y.o. female presenting on 06/23/2018 for Adjustment Disorder and Leg Pain   Virtual Visit via Telephone Note  I connected with Jennifer [name] on 06/23/18 at  1:20 PM EDT by telephone and verified that I am speaking with the correct person using two identifiers.   I discussed the limitations, risks, security and privacy concerns of performing an evaluation and management service by telephone and the availability of in person appointments. I also discussed with the patient that there may be a patient responsible charge related to this service. The patient expressed understanding and agreed to proceed.  Patient location: home Provider location: [place] office  Persons involved in the visit: patient, provider   HPI   Patient is following up for adjustment disorder. Last visit she was started on zoloft 50 mg daily. She feels like this helps her mood. She is still dating her current partner and feels like the relationship is going OK for now.  She is reporting leg cramping and sensation to move legs at night. This only happens at night. Pain does not happen when walking and is not relieved by rest.   Social History   Tobacco Use  . Smoking status: Current Every Day Smoker    Packs/day: 0.75    Years: 46.35    Pack years: 34.76    Types: Cigarettes  . Smokeless tobacco: Never Used  Substance Use Topics  . Alcohol use: Yes    Alcohol/week: 12.0 standard drinks    Types: 12 Shots of liquor per week    Comment: occasional  . Drug use: No    Review of Systems Per HPI unless specifically indicated above     Objective:    There were no vitals taken for this visit.  Wt Readings from Last 3 Encounters:  04/07/18 140 lb 12.8 oz (63.9 kg)  03/07/18 148 lb 3.2 oz (67.2 kg)  01/03/18 157 lb 3.2 oz (71.3 kg)    Physical Exam Results for orders  placed or performed in visit on 01/03/18  Lipid Profile  Result Value Ref Range   Cholesterol, Total 215 (H) 100 - 199 mg/dL   Triglycerides 116 0 - 149 mg/dL   HDL 94 >39 mg/dL   VLDL Cholesterol Cal 23 5 - 40 mg/dL   LDL Calculated 98 0 - 99 mg/dL   Chol/HDL Ratio 2.3 0.0 - 4.4 ratio  Comprehensive Metabolic Panel (CMET)  Result Value Ref Range   Glucose 58 (L) 65 - 99 mg/dL   BUN 8 8 - 27 mg/dL   Creatinine, Ser 0.88 0.57 - 1.00 mg/dL   GFR calc non Af Amer 69 >59 mL/min/1.73   GFR calc Af Amer 80 >59 mL/min/1.73   BUN/Creatinine Ratio 9 (L) 12 - 28   Sodium 140 134 - 144 mmol/L   Potassium 4.4 3.5 - 5.2 mmol/L   Chloride 102 96 - 106 mmol/L   CO2 23 20 - 29 mmol/L   Calcium 9.4 8.7 - 10.3 mg/dL   Total Protein 6.8 6.0 - 8.5 g/dL   Albumin 4.7 3.6 - 4.8 g/dL   Globulin, Total 2.1 1.5 - 4.5 g/dL   Albumin/Globulin Ratio 2.2 1.2 - 2.2   Bilirubin Total 0.4 0.0 - 1.2 mg/dL   Alkaline Phosphatase 83 39 - 117 IU/L   AST 28 0 - 40  IU/L   ALT 17 0 - 32 IU/L      Assessment & Plan:  1. Adjustment disorder, unspecified type  She will continue zoloft at 50 mg daily.  2. Restless leg syndrome  Trial of requip as below. When she returns to office we can check labwork for iron deficiency as well.  - rOPINIRole (REQUIP) 0.5 MG tablet; Take 1 tablet (0.5 mg total) by mouth at bedtime.  Dispense: 90 tablet; Refill: 0    Follow up plan: Return in about 4 months (around 10/24/2018) for chronic issues, CPE.  Carles Collet, PA-C [facility] 06/23/2018, 1:30 PM

## 2018-06-28 ENCOUNTER — Other Ambulatory Visit: Payer: Self-pay

## 2018-06-28 ENCOUNTER — Emergency Department
Admission: EM | Admit: 2018-06-28 | Discharge: 2018-06-28 | Disposition: A | Discharge status: Home / Self Care | Payer: Medicare HMO | Attending: Emergency Medicine | Admitting: Emergency Medicine

## 2018-06-28 DIAGNOSIS — F1029 Alcohol dependence with unspecified alcohol-induced disorder: Secondary | ICD-10-CM | POA: Diagnosis not present

## 2018-06-28 DIAGNOSIS — F1020 Alcohol dependence, uncomplicated: Secondary | ICD-10-CM | POA: Insufficient documentation

## 2018-06-28 DIAGNOSIS — F17210 Nicotine dependence, cigarettes, uncomplicated: Secondary | ICD-10-CM | POA: Diagnosis not present

## 2018-06-28 DIAGNOSIS — Z85828 Personal history of other malignant neoplasm of skin: Secondary | ICD-10-CM | POA: Diagnosis not present

## 2018-06-28 DIAGNOSIS — J449 Chronic obstructive pulmonary disease, unspecified: Secondary | ICD-10-CM | POA: Diagnosis not present

## 2018-06-28 DIAGNOSIS — Z79899 Other long term (current) drug therapy: Secondary | ICD-10-CM | POA: Diagnosis not present

## 2018-06-28 DIAGNOSIS — Z0489 Encounter for examination and observation for other specified reasons: Secondary | ICD-10-CM | POA: Diagnosis present

## 2018-06-28 LAB — URINE DRUG SCREEN, QUALITATIVE (ARMC ONLY)
Amphetamines, Ur Screen: NOT DETECTED
Barbiturates, Ur Screen: NOT DETECTED
Benzodiazepine, Ur Scrn: NOT DETECTED
Cannabinoid 50 Ng, Ur ~~LOC~~: NOT DETECTED
Cocaine Metabolite,Ur ~~LOC~~: NOT DETECTED
MDMA (Ecstasy)Ur Screen: NOT DETECTED
Methadone Scn, Ur: NOT DETECTED
Opiate, Ur Screen: NOT DETECTED
Phencyclidine (PCP) Ur S: NOT DETECTED
Tricyclic, Ur Screen: NOT DETECTED

## 2018-06-28 LAB — COMPREHENSIVE METABOLIC PANEL
ALT: 73 U/L — ABNORMAL HIGH (ref 0–44)
AST: 110 U/L — ABNORMAL HIGH (ref 15–41)
Albumin: 4.3 g/dL (ref 3.5–5.0)
Alkaline Phosphatase: 74 U/L (ref 38–126)
Anion gap: 9 (ref 5–15)
BUN: 10 mg/dL (ref 8–23)
CO2: 23 mmol/L (ref 22–32)
Calcium: 8.8 mg/dL — ABNORMAL LOW (ref 8.9–10.3)
Chloride: 111 mmol/L (ref 98–111)
Creatinine, Ser: 0.64 mg/dL (ref 0.44–1.00)
GFR calc Af Amer: >60 mL/min (ref >60)
GFR calc non Af Amer: >60 mL/min (ref >60)
Glucose, Bld: 123 mg/dL — ABNORMAL HIGH (ref 70–99)
Potassium: 3.1 mmol/L — ABNORMAL LOW (ref 3.5–5.1)
Sodium: 143 mmol/L (ref 135–145)
Total Bilirubin: 0.2 mg/dL — ABNORMAL LOW (ref 0.3–1.2)
Total Protein: 6.9 g/dL (ref 6.5–8.1)

## 2018-06-28 LAB — CBC
HCT: 41.6 % (ref 36.0–46.0)
Hemoglobin: 14.1 g/dL (ref 12.0–15.0)
MCH: 32.6 pg (ref 26.0–34.0)
MCHC: 33.9 g/dL (ref 30.0–36.0)
MCV: 96.1 fL (ref 80.0–100.0)
Platelets: 176 10*3/uL (ref 150–400)
RBC: 4.33 MIL/uL (ref 3.87–5.11)
RDW: 15.9 % — ABNORMAL HIGH (ref 11.5–15.5)
WBC: 6.2 10*3/uL (ref 4.0–10.5)
nRBC: 0 % (ref 0.0–0.2)

## 2018-06-28 LAB — ETHANOL: Alcohol, Ethyl (B): 316 mg/dL (ref <10)

## 2018-06-28 NOTE — Discharge Instructions (Addendum)
Please follow-up with the outpatient resources provided to you to help arrange for detox.  Residential treatment services will have a bed starting tomorrow.  Please contact them in the morning.

## 2018-06-28 NOTE — ED Notes (Signed)
Hourly rounding reveals patient in hall bed. No complaints, stable, in no acute distress. Q15 minute rounds and monitoring via Rover and Officer to continue.  

## 2018-06-28 NOTE — ED Provider Notes (Signed)
[facility] has received A return call form [facility] who reports that they have not female detox beds and will have no beds available until June 3,2020

## 2018-06-28 NOTE — ED Provider Notes (Signed)
[facility] Emergency Department Provider Note  Time seen: 7:20 PM  I have reviewed the triage vital signs and the nursing notes.   HISTORY  Chief Complaint Alcohol Problem   HPI Jennifer [name] is a 68 y.o. female with a past medical history of arthritis, hyperlipidemia, hypertension, COPD, presents to the emergency department hoping to detox from alcohol.  According to the patient she is a longtime alcoholic drinks approximately 1 pint of liquor per day.  States her last drink was approximately 2 to 3 hours ago.  Patient states she is tired of drinking and is hoping to detox.  Patient does admit that she will get shaky when she stops drinking alcohol but denies any seizure activity from not drinking.   Denies any fever cough congestion or shortness of breath.  Past Medical History:  Diagnosis Date  . Arthritis    mild  . Cancer (HCC)    skin cancer leg and chest  . Dysrhythmia    palpitation  . Family history of colon cancer   . Headache    as  a child   . Heart murmur    while pregnant  . Hyperlipidemia 07/17/2017    Patient Active Problem List   Diagnosis Date Noted  . Hypertension 04/07/2018  . Osteoporosis 01/03/2018  . COPD (chronic obstructive pulmonary disease) ([place]) 11/02/2017  . Umbilical hernia s/p primary repair 07/16/2017 07/17/2017  . Hyperlipidemia 07/17/2017  . Arthritis   . Adenomatous polyp of ascending colon s/p right colectomy 07/16/2017 07/16/2017  . Genetic testing 05/13/2017  . History of colonic polyps 05/03/2017  . Family history of colon cancer   . Special screening for malignant neoplasms, colon   . Tobacco abuse 03/10/2017    Past Surgical History:  Procedure Laterality Date  . ABDOMINAL HYSTERECTOMY     1996  . COLONOSCOPY WITH PROPOFOL N/A 03/29/2017   Procedure: COLONOSCOPY WITH PROPOFOL;  Surgeon: Lin Landsman, MD;  Location: [facility] ENDOSCOPY;  Service: Gastroenterology;  Laterality: N/A;  . HEMICOLECTOMY     righ  [id number]  . LAPAROSCOPIC RIGHT HEMI COLECTOMY Right 07/16/2017   Procedure: LAPAROSCOPIC RIGHT HEMI COLECTOMY;  Surgeon: Ileana Roup, MD;  Location: [facility] ORS;  Service: General;  Laterality: Right;  . TUBAL LIGATION      Prior to Admission medications   Medication Sig Start Date End Date Taking? Authorizing Provider  alendronate (FOSAMAX) 70 MG tablet TAKE 1 TABLET BY MOUTH ONCE A WEEK ON AN EMPTY STOMACH WITH  A  FULL  GLASS  OF  WATER 05/31/18   Trinna Post, PA-C  atorvastatin (LIPITOR) 10 MG tablet TAKE 1 TABLET BY MOUTH ONCE DAILY 10/19/17   Birdie Sons, MD  metoprolol tartrate (LOPRESSOR) 25 MG tablet Take 1 tablet (25 mg total) by mouth 2 (two) times daily. 03/11/18 06/09/18  Trinna Post, PA-C  rOPINIRole (REQUIP) 0.5 MG tablet Take 1 tablet (0.5 mg total) by mouth at bedtime. 06/23/18 09/21/18  Trinna Post, PA-C  sertraline (ZOLOFT) 50 MG tablet Take 1 tablet (50 mg total) by mouth daily. 04/07/18 07/06/18  Trinna Post, PA-C  varenicline (CHANTIX STARTING MONTH PAK) 0.5 MG X 11 & 1 MG X 42 tablet 0.5 mg tablet by mouth once daily for 3 days, then 0.5 mg tablet twice daily for 4 days, then increase to one 1 mg tablet twice daily. 03/07/18   Trinna Post, PA-C    No Known Allergies  Family History  Problem Relation  Age of Onset  . Diabetes Mother   . Kidney disease Mother   . Heart attack Father   . Diabetes Maternal Grandmother   . Colon cancer Maternal Grandfather   . Heart disease Paternal Grandmother   . Colon cancer Paternal Uncle        dx >50, colon completely removed  . Colon cancer Paternal Aunt 10    Social History Social History   Tobacco Use  . Smoking status: Current Every Day Smoker    Packs/day: 0.75    Years: 46.35    Pack years: 34.76    Types: Cigarettes  . Smokeless tobacco: Never Used  Substance Use Topics  . Alcohol use: Yes    Alcohol/week: 12.0 standard drinks    Types: 12 Shots of liquor per week    Comment:  occasional  . Drug use: No    Review of Systems Constitutional: Negative for fever. ENT: Negative for recent illness/congestion Cardiovascular: Negative for chest pain. Respiratory: Negative for shortness of breath.  Negative for cough. Gastrointestinal: Negative for abdominal pain, vomiting  Musculoskeletal: Negative for musculoskeletal complaints Neurological: Negative for headache All other ROS negative  ____________________________________________   PHYSICAL EXAM:  VITAL SIGNS: ED Triage Vitals [06/28/18 1849]  Enc Vitals Group     BP 122/60     Pulse Rate 93     Resp 16     Temp 98.7 F (37.1 C)     Temp Source Oral     SpO2 95 %     Weight 140 lb (63.5 kg)     Height 5\' 10"  (1.778 m)     Head Circumference      Peak Flow      Pain Score 0     Pain Loc      Pain Edu?      Excl. in [place]?    Constitutional: Alert and oriented. Well appearing and in no distress. Eyes: Normal exam ENT      Head: Normocephalic and atraumatic.      Mouth/Throat: Mucous membranes are moist. Cardiovascular: Normal rate, regular rhythm.  Respiratory: Normal respiratory effort without tachypnea nor retractions. Breath sounds are clear Gastrointestinal: Soft and nontender. No distention.  Musculoskeletal: Nontender with normal range of motion in all extremities.  Neurologic:  Normal speech and language. No gross focal neurologic deficits  Skin:  Skin is warm, dry and intact.  Psychiatric: Mood and affect are normal.   ____________________________________________   INITIAL IMPRESSION / ASSESSMENT AND PLAN / ED COURSE  Pertinent labs & imaging results that were available during my care of the patient were reviewed by me and considered in my medical decision making (see chart for details).   Patient presents to the emergency department hoping to detox from alcohol.  Patient is a daily alcohol drinker, last use alcohol approximately 2 to 3 hours ago.  Patient has no medical complaints  today.  We will check labs including an ethanol level, urine drug screen and refer out to detox if possible.  Patient agreeable to plan of care.  Patient's alcohol level is elevated 316.  TTS has attempted to get detox for the patient however there are no beds available for tonight.  They have provided outpatient resources.  Patient will be discharged home.  She is attempting to obtain a ride home.  Jennifer [name] was evaluated in Emergency Department on 06/28/2018 for the symptoms described in the history of present illness. She was evaluated in the context of the global  COVID-19 pandemic, which necessitated consideration that the patient might be at risk for infection with the SARS-CoV-2 virus that causes COVID-19. Institutional protocols and algorithms that pertain to the evaluation of patients at risk for COVID-19 are in a state of rapid change based on information released by regulatory bodies including the CDC and federal and state organizations. These policies and algorithms were followed during the patient's care in the ED.  ____________________________________________   FINAL CLINICAL IMPRESSION(S) / ED DIAGNOSES  Alcoholism Alcohol detox   Harvest Dark, MD 06/28/18 2204

## 2018-06-28 NOTE — ED Notes (Signed)
Patient alert and oriented, warm and dry, in no acute distress. Patient denies SI, HI, AVH and pain. Patient made aware of Q15 minute rounds and Engineer, drilling presence for their safety. Patient instructed to come to me with needs or concerns.

## 2018-06-28 NOTE — ED Notes (Signed)
Dr Kerman Passey made aware at this time of pt's elevated ETOH level as reported by lab. ETOH 316 mg/dL

## 2018-06-28 NOTE — ED Triage Notes (Signed)
Pt states she is here for alcohol detox, states she drink about a pint of liquor/day. Last drink was around 545pm today.

## 2018-06-29 ENCOUNTER — Telehealth: Payer: Self-pay

## 2018-06-29 NOTE — Telephone Encounter (Signed)
Called at 12:24 PM. No answer. LMTCB

## 2018-06-29 NOTE — Telephone Encounter (Signed)
Patient was at the hospital all night.  Would like to talk to you about getting in somewhere to Detox and then get more inpatient treatment.   CB# [phone]

## 2018-06-29 NOTE — Telephone Encounter (Signed)
Actually, if she is going to want to pursue inpatient or outpatient treatment she is going to need an office visit to get more history and further assess. Please schedule for telephone visit when she calls back.

## 2018-07-01 ENCOUNTER — Emergency Department
Admission: EM | Admit: 2018-07-01 | Discharge: 2018-07-01 | Disposition: A | Discharge status: Home / Self Care | Payer: Medicare HMO | Attending: Emergency Medicine | Admitting: Emergency Medicine

## 2018-07-01 ENCOUNTER — Other Ambulatory Visit: Payer: Self-pay

## 2018-07-01 ENCOUNTER — Telehealth: Payer: Self-pay | Admitting: Physician Assistant

## 2018-07-01 DIAGNOSIS — F1020 Alcohol dependence, uncomplicated: Secondary | ICD-10-CM | POA: Diagnosis not present

## 2018-07-01 DIAGNOSIS — R69 Illness, unspecified: Secondary | ICD-10-CM | POA: Diagnosis not present

## 2018-07-01 DIAGNOSIS — Z5321 Procedure and treatment not carried out due to patient leaving prior to being seen by health care provider: Secondary | ICD-10-CM | POA: Diagnosis not present

## 2018-07-01 DIAGNOSIS — R5381 Other malaise: Secondary | ICD-10-CM | POA: Diagnosis not present

## 2018-07-01 NOTE — ED Notes (Signed)
Pt sitting calmly in bed.

## 2018-07-01 NOTE — Telephone Encounter (Signed)
See below. KW 

## 2018-07-01 NOTE — Telephone Encounter (Signed)
Patient states that she will be going to in patient rehab in [place], she will be leaving 07/04/18.KW

## 2018-07-01 NOTE — ED Notes (Addendum)
This RN tried to educate pt on need to stay but pt upset and stating "no one has been in to see me and no one cares about how I'm feeling inside." This RN and 2nd RN have seen pt at bedside. Pt refusing to stay post education and unwilling to sign AMA paperwork. Pt left ambulatory/steady.

## 2018-07-01 NOTE — ED Provider Notes (Signed)
Patient left prior to my evaluation    Nance Pear, MD 07/01/18 1843

## 2018-07-01 NOTE — Telephone Encounter (Signed)
Pt returned call ° °[name] °

## 2018-07-01 NOTE — ED Triage Notes (Signed)
Pt in via EMS d/t anxiety. Pt recently seen about ETOH use. Pt dx with intent to be seen at clinic seeking alcohol assistance program. This appointment was for Monday. Pt states too anxious so she decided to have EMS brign her in to ER. Pt alert.

## 2018-07-01 NOTE — Telephone Encounter (Signed)
[email or web address]

## 2018-07-01 NOTE — Telephone Encounter (Signed)
See previous telephone encounter. KW

## 2018-07-01 NOTE — ED Notes (Addendum)
Pt given warm blankets; room door open.

## 2018-07-01 NOTE — ED Notes (Signed)
Pt denies any thoughts/ideas/intent to harm self or others.

## 2018-07-01 NOTE — ED Notes (Addendum)
Pt offered food tray/drink verbal okay from [facility]. Pt declined.

## 2018-07-04 DIAGNOSIS — E785 Hyperlipidemia, unspecified: Secondary | ICD-10-CM | POA: Diagnosis not present

## 2018-07-04 DIAGNOSIS — M810 Age-related osteoporosis without current pathological fracture: Secondary | ICD-10-CM | POA: Diagnosis not present

## 2018-07-04 DIAGNOSIS — F4321 Adjustment disorder with depressed mood: Secondary | ICD-10-CM | POA: Diagnosis not present

## 2018-07-04 DIAGNOSIS — I10 Essential (primary) hypertension: Secondary | ICD-10-CM | POA: Diagnosis not present

## 2018-07-04 DIAGNOSIS — F331 Major depressive disorder, recurrent, moderate: Secondary | ICD-10-CM | POA: Diagnosis not present

## 2018-07-04 DIAGNOSIS — F1020 Alcohol dependence, uncomplicated: Secondary | ICD-10-CM | POA: Diagnosis not present

## 2018-07-04 DIAGNOSIS — E7800 Pure hypercholesterolemia, unspecified: Secondary | ICD-10-CM | POA: Diagnosis not present

## 2018-07-04 DIAGNOSIS — F10239 Alcohol dependence with withdrawal, unspecified: Secondary | ICD-10-CM | POA: Diagnosis not present

## 2018-07-12 ENCOUNTER — Telehealth: Payer: Self-pay

## 2018-07-12 DIAGNOSIS — F1020 Alcohol dependence, uncomplicated: Secondary | ICD-10-CM

## 2018-07-12 NOTE — Telephone Encounter (Signed)
We can talk about it at the visit. Will need to involve social work. Please let her know I''m going to place a referral to [facility] so they can get in touch and we will talk more on 07/15/2018. Is she not going to [facility] in [place]?

## 2018-07-12 NOTE — Telephone Encounter (Signed)
Patient states that she is not going to [facility]. Explained to her that you will put in the referral and discuss options at her next visit. She stated that she understood.

## 2018-07-12 NOTE — Telephone Encounter (Signed)
Patient states that the doctor at [facility] stopped her bp medication due to bp been low. Patient wants to know if you could help her with getting into a alcohol program in [place]. Patient schedule appointment to come see you on 07/15/18 @ 1:40 PM.FYI

## 2018-07-15 ENCOUNTER — Ambulatory Visit (INDEPENDENT_AMBULATORY_CARE_PROVIDER_SITE_OTHER): Payer: Medicare HMO | Admitting: Physician Assistant

## 2018-07-15 ENCOUNTER — Other Ambulatory Visit: Payer: Self-pay

## 2018-07-15 ENCOUNTER — Encounter: Payer: Self-pay | Admitting: Physician Assistant

## 2018-07-15 VITALS — BP 115/71 | HR 79 | Temp 98.3°F | Wt 134.6 lb

## 2018-07-15 DIAGNOSIS — F329 Major depressive disorder, single episode, unspecified: Secondary | ICD-10-CM

## 2018-07-15 DIAGNOSIS — J439 Emphysema, unspecified: Secondary | ICD-10-CM | POA: Diagnosis not present

## 2018-07-15 DIAGNOSIS — I700 Atherosclerosis of aorta: Secondary | ICD-10-CM | POA: Diagnosis not present

## 2018-07-15 DIAGNOSIS — F17209 Nicotine dependence, unspecified, with unspecified nicotine-induced disorders: Secondary | ICD-10-CM | POA: Diagnosis not present

## 2018-07-15 DIAGNOSIS — F32A Depression, unspecified: Secondary | ICD-10-CM

## 2018-07-15 DIAGNOSIS — F1010 Alcohol abuse, uncomplicated: Secondary | ICD-10-CM | POA: Diagnosis not present

## 2018-07-15 DIAGNOSIS — I10 Essential (primary) hypertension: Secondary | ICD-10-CM | POA: Diagnosis not present

## 2018-07-15 DIAGNOSIS — R251 Tremor, unspecified: Secondary | ICD-10-CM | POA: Diagnosis not present

## 2018-07-15 NOTE — Progress Notes (Signed)
Patient: Jennifer [name] Female    DOB: 03-10-1950   68 y.o.   MRN: [id number] Visit Date: 07/15/2018  Today's Provider: Trinna Post, PA-C   Chief Complaint  Patient presents with  . Referral   Subjective:     HPI Patient presents today for a referral to help with alcohol disorder. Patient states she was in the hospital for detox 07/04/2018-07/11/2018 but in [place] it has 06/28/2018. Patient reports that when she was in the hospital her bp was low that they stopped her bp medication. Went to [facility] in [place] for detox and was there from 07/04/2018 and 07/11/2018. She has been off of alcohol for two weeks. She has an appointment with [facility] on Tuesday as walk in assessment. She was most recently drinking a pint a day. She was switched from zoloft to effexor 37.5 mg daily. She was discontinued from her metoprolol due to hypotension. She reports her blood pressures at [facility] were normal off blood pressure. Reports her increased alcohol consumption was due in large part to her recent partner. She is no longer seeing this person any more.   Reports she has a tremor that has worsened since coming off.   No Known Allergies   Current Outpatient Medications:  .  alendronate (FOSAMAX) 70 MG tablet, TAKE 1 TABLET BY MOUTH ONCE A WEEK ON AN EMPTY STOMACH WITH  A  FULL  GLASS  OF  WATER, Disp: 12 tablet, Rfl: 0 .  atorvastatin (LIPITOR) 10 MG tablet, TAKE 1 TABLET BY MOUTH ONCE DAILY, Disp: 90 tablet, Rfl: 1 .  rOPINIRole (REQUIP) 0.5 MG tablet, Take 1 tablet (0.5 mg total) by mouth at bedtime., Disp: 90 tablet, Rfl: 0 .  varenicline (CHANTIX STARTING MONTH PAK) 0.5 MG X 11 & 1 MG X 42 tablet, 0.5 mg tablet by mouth once daily for 3 days, then 0.5 mg tablet twice daily for 4 days, then increase to one 1 mg tablet twice daily., Disp: 53 tablet, Rfl: 0 .  metoprolol tartrate (LOPRESSOR) 25 MG tablet, Take 1 tablet (25 mg total) by mouth 2 (two) times daily., Disp: 180 tablet, Rfl: 0 .   sertraline (ZOLOFT) 50 MG tablet, Take 1 tablet (50 mg total) by mouth daily., Disp: 90 tablet, Rfl: 0  Review of Systems  Constitutional: Negative.   Respiratory: Negative.   Genitourinary: Negative.   Neurological: Negative.     Social History   Tobacco Use  . Smoking status: Current Every Day Smoker    Packs/day: 0.75    Years: 46.35    Pack years: 34.76    Types: Cigarettes  . Smokeless tobacco: Never Used  Substance Use Topics  . Alcohol use: Yes    Alcohol/week: 12.0 standard drinks    Types: 12 Shots of liquor per week    Comment: occasional      Objective:   BP 115/71 (BP Location: Left Arm, Patient Position: Sitting, Cuff Size: Normal)   Pulse 79   Temp 98.3 F (36.8 C) (Oral)   Wt 134 lb 9.6 oz (61.1 kg)   SpO2 95%   BMI 19.31 kg/m  Vitals:   07/15/18 1344  BP: 115/71  Pulse: 79  Temp: 98.3 F (36.8 C)  TempSrc: Oral  SpO2: 95%  Weight: 134 lb 9.6 oz (61.1 kg)     Physical Exam Constitutional:      Appearance: Normal appearance.  Cardiovascular:     Rate and Rhythm: Normal rate and regular  rhythm.     Heart sounds: Normal heart sounds.  Pulmonary:     Effort: Pulmonary effort is normal.     Breath sounds: Normal breath sounds.  Skin:    General: Skin is warm and dry.  Neurological:     Mental Status: She is alert and oriented to person, place, and time. Mental status is at baseline.     Motor: Tremor present.  Psychiatric:        Mood and Affect: Mood normal.        Behavior: Behavior normal.         Assessment & Plan    1. Depression, unspecified depression type  She has changed from zoloft to effexor. She has an upcoming appointment with [facility].  2. Alcohol abuse  Detoxed at [facility].  3. Tobacco use disorder, continuous  Continues to smoke. Has tried chantix in the past.   4. Pulmonary emphysema, unspecified emphysema type ([place])   5. Aortic atherosclerosis ([place])  On a statin.   6. Hypertension  Patient  normotensive today. Will trial off metoprolol.   7. Tremor  Suspect essential tremor that was muted by alcohol use. She is two weeks from her last drink, continue to observe.   The entirety of the information documented in the History of Present Illness, Review of Systems and Physical Exam were personally obtained by me. Portions of this information were initially documented by Lynford Humphrey, CMA and reviewed by me for thoroughness and accuracy.   F/u 4 months      Trinna Post, PA-C  [facility]

## 2018-07-15 NOTE — Patient Instructions (Signed)
Depression Screening Depression screening is a tool that your health care provider can use to learn if you have symptoms of depression. Depression is a common condition with many symptoms that are also often found in other conditions. Depression is treatable, but it must first be diagnosed. You may not know that certain feelings, thoughts, and behaviors that you are having can be symptoms of depression. Taking a depression screening test can help you and your health care provider decide if you need more assessment, or if you should be referred to a mental health care provider. What are the screening tests?  You may have a physical exam to see if another condition is affecting your mental health. You may have a blood or urine sample taken during the physical exam.  You may be interviewed using a screening tool that was developed from research, such as one of these: ? Patient Health Questionnaire (PHQ). This is a set of either 2 or 9 questions. A health care provider who has been trained to score this screening test uses a guide to assess if your symptoms suggest that you may have depression. ? Hamilton Depression Rating Scale (HAM-D). This is a set of either 17 or 24 questions. You may be asked to take it again during or after your treatment, to see if your depression has gotten better. ? Beck Depression Inventory (BDI). This is a set of 21 multiple choice questions. Your health care provider scores your answers to assess:  Your level of depression, ranging from mild to severe.  Your response to treatment.  Your health care provider may talk with you about your daily activities, such as eating, sleeping, work, and recreation, and ask if you have had any changes in activity.  Your health care provider may ask you to see a mental health specialist, such as a psychiatrist or psychologist, for more evaluation. Who should be screened for depression?   All adults, including adults with a family history  of a mental health disorder.  Adolescents who are 12-18 years old.  People who are recovering from a myocardial infarction (MI).  Pregnant women, or women who have given birth.  People who have a long-term (chronic) illness.  Anyone who has been diagnosed with another type of a mental health disorder.  Anyone who has symptoms that could show depression. What do my results mean? Your health care provider will review the results of your depression screening, physical exam, and lab tests. Positive screens suggest that you may have depression. Screening is the first step in getting the care that you may need. It is up to you to get your screening results. Ask your health care provider, or the department that is doing your screening tests, when your results will be ready. Talk with your health care provider about your results and diagnosis. A diagnosis of depression is made using the Diagnostic and Statistical Manual of Mental Disorders (DSM-V). This is a book that lists the number and type of symptoms that must be present for a health care provider to give a specific diagnosis.  Your health care provider may work with you to treat your symptoms of depression, or your health care provider may help you find a mental health provider who can assess, diagnose, and treat your depression. Get help right away if:  You have thoughts about hurting yourself or others. If you ever feel like you may hurt yourself or others, or have thoughts about taking your own life, get help right away. You   can go to your nearest emergency department or call:  Your local emergency services (911 in the [place]  A suicide crisis helpline, such as the National Suicide Prevention Lifeline at 1-800-273-8255. This is open 24 hours a day. Summary  Depression screening is the first step in getting the help that you may need.  If your screening test shows symptoms of depression (is positive), your health care provider may ask  you to see a mental health provider.  Anyone who is age 12 or older should be screened for depression. This information is not intended to replace advice given to you by your health care provider. Make sure you discuss any questions you have with your health care provider. Document Released: 05/29/2016 Document Revised: 05/29/2016 Document Reviewed: 05/29/2016 Elsevier Interactive Patient Education  2019 Elsevier Inc.  

## 2018-07-18 ENCOUNTER — Encounter: Payer: Self-pay | Admitting: *Deleted

## 2018-07-18 ENCOUNTER — Other Ambulatory Visit: Payer: Self-pay

## 2018-07-18 DIAGNOSIS — I10 Essential (primary) hypertension: Secondary | ICD-10-CM | POA: Insufficient documentation

## 2018-07-18 DIAGNOSIS — Z85828 Personal history of other malignant neoplasm of skin: Secondary | ICD-10-CM | POA: Diagnosis not present

## 2018-07-18 DIAGNOSIS — J449 Chronic obstructive pulmonary disease, unspecified: Secondary | ICD-10-CM | POA: Diagnosis not present

## 2018-07-18 DIAGNOSIS — F17210 Nicotine dependence, cigarettes, uncomplicated: Secondary | ICD-10-CM | POA: Insufficient documentation

## 2018-07-18 DIAGNOSIS — R0789 Other chest pain: Secondary | ICD-10-CM | POA: Diagnosis not present

## 2018-07-18 DIAGNOSIS — R457 State of emotional shock and stress, unspecified: Secondary | ICD-10-CM | POA: Diagnosis not present

## 2018-07-18 DIAGNOSIS — Z79899 Other long term (current) drug therapy: Secondary | ICD-10-CM | POA: Diagnosis not present

## 2018-07-18 DIAGNOSIS — M79602 Pain in left arm: Secondary | ICD-10-CM | POA: Diagnosis not present

## 2018-07-18 DIAGNOSIS — R079 Chest pain, unspecified: Secondary | ICD-10-CM | POA: Diagnosis not present

## 2018-07-18 LAB — CBC
HCT: 41.4 % (ref 36.0–46.0)
Hemoglobin: 13.8 g/dL (ref 12.0–15.0)
MCH: 32.3 pg (ref 26.0–34.0)
MCHC: 33.3 g/dL (ref 30.0–36.0)
MCV: 97 fL (ref 80.0–100.0)
Platelets: 245 10*3/uL (ref 150–400)
RBC: 4.27 MIL/uL (ref 3.87–5.11)
RDW: 14.3 % (ref 11.5–15.5)
WBC: 8.7 10*3/uL (ref 4.0–10.5)
nRBC: 0 % (ref 0.0–0.2)

## 2018-07-18 LAB — BASIC METABOLIC PANEL
Anion gap: 12 (ref 5–15)
BUN: 8 mg/dL (ref 8–23)
CO2: 24 mmol/L (ref 22–32)
Calcium: 9 mg/dL (ref 8.9–10.3)
Chloride: 102 mmol/L (ref 98–111)
Creatinine, Ser: 0.69 mg/dL (ref 0.44–1.00)
GFR calc Af Amer: >60 mL/min (ref >60)
GFR calc non Af Amer: >60 mL/min (ref >60)
Glucose, Bld: 87 mg/dL (ref 70–99)
Potassium: 3.7 mmol/L (ref 3.5–5.1)
Sodium: 138 mmol/L (ref 135–145)

## 2018-07-18 LAB — TROPONIN I: Troponin I: <0.03 ng/mL (ref <0.03)

## 2018-07-18 MED ORDER — SODIUM CHLORIDE 0.9% FLUSH: 3.0000 mL | Freq: Once | INTRAVENOUS | Status: DC

## 2018-07-18 NOTE — ED Triage Notes (Signed)
Pt brought in via ems from home with chest pain.  No sob. Pain in center of chest and left arm.  Pain lasted 10 minutes.  No n/v no diaphoresis.  Pt alert

## 2018-07-19 ENCOUNTER — Encounter: Payer: Self-pay | Admitting: Physician Assistant

## 2018-07-19 ENCOUNTER — Emergency Department: Payer: Medicare HMO

## 2018-07-19 ENCOUNTER — Emergency Department
Admission: EM | Admit: 2018-07-19 | Discharge: 2018-07-19 | Disposition: A | Discharge status: Home / Self Care | Payer: Medicare HMO | Attending: Emergency Medicine | Admitting: Emergency Medicine

## 2018-07-19 ENCOUNTER — Telehealth: Payer: Self-pay | Admitting: Physician Assistant

## 2018-07-19 DIAGNOSIS — R079 Chest pain, unspecified: Secondary | ICD-10-CM

## 2018-07-19 DIAGNOSIS — I700 Atherosclerosis of aorta: Secondary | ICD-10-CM | POA: Insufficient documentation

## 2018-07-19 LAB — TROPONIN I: Troponin I: <0.03 ng/mL (ref <0.03)

## 2018-07-19 NOTE — Discharge Instructions (Signed)
Take daily medicines as directed by your doctor.  Return to the ER for worsening symptoms, persistent vomiting, difficulty breathing or other concerns.

## 2018-07-19 NOTE — Telephone Encounter (Signed)
Pt stated she went to ED last night for elevated BP. Pt stated her BP was 190/101. Pt stated she was advised to call PCP this morning. Pt is requesting call back to see if she needs a F/U appt and if so can she be worked in? Pt denied feeling weak, dizzy, or nauseous. Pt stated that this morning she just has anxiety. Pt stated that she doesn't have a way of checking her BP at home. Please advise. Thanks TNP

## 2018-07-19 NOTE — Telephone Encounter (Signed)
Reviewed  ER note, it says 167/84 for BP. Possible it could be due to anxiety. If she is going to [facility] today they may have a BP cuff there to check her, and if so she can check that way. Since she is not having symptoms, she can be seen tomorrow to check BP. Also advise getting BP cuff for home if affordable to her.

## 2018-07-19 NOTE — Telephone Encounter (Signed)
Patient advised as below.  

## 2018-07-19 NOTE — ED Provider Notes (Signed)
[facility] Emergency Department Provider Note   ____________________________________________   First MD Initiated Contact with Patient 07/19/18 [phone]     (approximate)  I have reviewed the triage vital signs and the nursing notes.   HISTORY  Chief Complaint Chest Pain    HPI Jennifer WIEST is a 68 y.o. female brought to the ED from home via EMS with a chief complaint of chest pain.  Patient reports a 10-minute episode of central chest discomfort approximately 9:30 PM while she was smoking outside.  Associated with pain on the back of her left arm.  Denies associated diaphoresis, shortness of breath, nausea/vomiting, palpitations or dizziness.  Pain has not returned since 9:40 PM.  Denies recent fever, cough, abdominal pain, dysuria, diarrhea.  Recent alcohol rehab 2 weeks ago and states she is currently sober.  Denies recent travel, trauma or exposure to persons diagnosed with coronavirus.       Past Medical History:  Diagnosis Date  . Arthritis    mild  . Cancer (HCC)    skin cancer leg and chest  . Dysrhythmia    palpitation  . Family history of colon cancer   . Headache    as  a child   . Heart murmur    while pregnant  . Hyperlipidemia 07/17/2017    Patient Active Problem List   Diagnosis Date Noted  . Hypertension 04/07/2018  . Osteoporosis 01/03/2018  . COPD (chronic obstructive pulmonary disease) ([place]) 11/02/2017  . Umbilical hernia s/p primary repair 07/16/2017 07/17/2017  . Hyperlipidemia 07/17/2017  . Arthritis   . Adenomatous polyp of ascending colon s/p right colectomy 07/16/2017 07/16/2017  . Genetic testing 05/13/2017  . History of colonic polyps 05/03/2017  . Family history of colon cancer   . Special screening for malignant neoplasms, colon   . Tobacco abuse 03/10/2017    Past Surgical History:  Procedure Laterality Date  . ABDOMINAL HYSTERECTOMY     1996  . COLONOSCOPY WITH PROPOFOL N/A 03/29/2017   Procedure: COLONOSCOPY  WITH PROPOFOL;  Surgeon: Lin Landsman, MD;  Location: [facility] ENDOSCOPY;  Service: Gastroenterology;  Laterality: N/A;  . HEMICOLECTOMY     righ  t6-21-19  . LAPAROSCOPIC RIGHT HEMI COLECTOMY Right 07/16/2017   Procedure: LAPAROSCOPIC RIGHT HEMI COLECTOMY;  Surgeon: Ileana Roup, MD;  Location: [facility] ORS;  Service: General;  Laterality: Right;  . TUBAL LIGATION      Prior to Admission medications   Medication Sig Start Date End Date Taking? Authorizing Provider  alendronate (FOSAMAX) 70 MG tablet TAKE 1 TABLET BY MOUTH ONCE A WEEK ON AN EMPTY STOMACH WITH  A  FULL  GLASS  OF  WATER 05/31/18   Trinna Post, PA-C  atorvastatin (LIPITOR) 10 MG tablet TAKE 1 TABLET BY MOUTH ONCE DAILY 10/19/17   Birdie Sons, MD  rOPINIRole (REQUIP) 0.5 MG tablet Take 1 tablet (0.5 mg total) by mouth at bedtime. 06/23/18 09/21/18  Trinna Post, PA-C  varenicline (CHANTIX STARTING MONTH PAK) 0.5 MG X 11 & 1 MG X 42 tablet 0.5 mg tablet by mouth once daily for 3 days, then 0.5 mg tablet twice daily for 4 days, then increase to one 1 mg tablet twice daily. 03/07/18   Trinna Post, PA-C    Allergies Patient has no known allergies.  Family History  Problem Relation Age of Onset  . Diabetes Mother   . Kidney disease Mother   . Heart attack Father   . Diabetes  Maternal Grandmother   . Colon cancer Maternal Grandfather   . Heart disease Paternal Grandmother   . Colon cancer Paternal Uncle        dx >50, colon completely removed  . Colon cancer Paternal Aunt 43    Social History Social History   Tobacco Use  . Smoking status: Current Every Day Smoker    Packs/day: 0.75    Years: 46.35    Pack years: 34.76    Types: Cigarettes  . Smokeless tobacco: Never Used  Substance Use Topics  . Alcohol use: Yes    Alcohol/week: 12.0 standard drinks    Types: 12 Shots of liquor per week    Comment: occasional  . Drug use: No    Review of Systems  Constitutional: No fever/chills Eyes:  No visual changes. ENT: No sore throat. Cardiovascular: Positive for chest pain. Respiratory: Denies shortness of breath. Gastrointestinal: No abdominal pain.  No nausea, no vomiting.  No diarrhea.  No constipation. Genitourinary: Negative for dysuria. Musculoskeletal: Negative for back pain. Skin: Negative for rash. Neurological: Negative for headaches, focal weakness or numbness.   ____________________________________________   PHYSICAL EXAM:  VITAL SIGNS: ED Triage Vitals  Enc Vitals Group     BP 07/18/18 2242 (!) 167/84     Pulse Rate 07/18/18 2242 68     Resp 07/18/18 2242 20     Temp 07/18/18 2242 98.7 F (37.1 C)     Temp Source 07/18/18 2242 Oral     SpO2 07/18/18 2242 99 %     Weight 07/18/18 2238 134 lb (60.8 kg)     Height 07/18/18 2238 5\' 10"  (1.778 m)     Head Circumference --      Peak Flow --      Pain Score 07/18/18 2238 0     Pain Loc --      Pain Edu? --      Excl. in [place]? --     Constitutional: Alert and oriented. Well appearing and in no acute distress. Eyes: Conjunctivae are normal. PERRL. EOMI. Head: Atraumatic. Nose: No congestion/rhinnorhea. Mouth/Throat: Mucous membranes are moist.  Oropharynx non-erythematous. Neck: No stridor.   Cardiovascular: Normal rate, regular rhythm. Grossly normal heart sounds.  Good peripheral circulation. Respiratory: Normal respiratory effort.  No retractions. Lungs CTAB.  Anterior chest tender to palpation. Gastrointestinal: Soft and nontender. No distention. No abdominal bruits. No CVA tenderness. Musculoskeletal: No lower extremity tenderness nor edema.  No joint effusions. Neurologic:  Normal speech and language. No gross focal neurologic deficits are appreciated. No gait instability. Skin:  Skin is warm, dry and intact. No rash noted. Psychiatric: Mood and affect are normal. Speech and behavior are normal.  ____________________________________________   LABS (all labs ordered are listed, but only abnormal  results are displayed)  Labs Reviewed  BASIC METABOLIC PANEL  CBC  TROPONIN I  TROPONIN I   ____________________________________________  EKG  ED ECG REPORT I, SUNG,JADE J, the attending physician, personally viewed and interpreted this ECG.   Date: 07/19/2018  EKG Time: 2241  Rate: 64  Rhythm: normal EKG, normal sinus rhythm  Axis: Normal  Intervals:none  ST&T Change: Nonspecific  ____________________________________________  RADIOLOGY  ED MD interpretation: No acute cardiopulmonary process  Official radiology report(s): No results found.  ____________________________________________   PROCEDURES  Procedure(s) performed (including Critical Care):  Procedures   ____________________________________________   INITIAL IMPRESSION / ASSESSMENT AND PLAN / ED COURSE  As part of my medical decision making, I reviewed the following data within  the electronic MEDICAL RECORD NUMBER Nursing notes reviewed and incorporated, Labs reviewed, EKG interpreted, Old chart reviewed, Radiograph reviewed and Notes from prior ED visits     IJEOMA LOOR was evaluated in Emergency Department on 07/19/2018 for the symptoms described in the history of present illness. She was evaluated in the context of the global COVID-19 pandemic, which necessitated consideration that the patient might be at risk for infection with the SARS-CoV-2 virus that causes COVID-19. Institutional protocols and algorithms that pertain to the evaluation of patients at risk for COVID-19 are in a state of rapid change based on information released by regulatory bodies including the CDC and federal and state organizations. These policies and algorithms were followed during the patient's care in the ED.   68 year old female with COPD who presents with a 10-minute episode of chest pain. Differential diagnosis includes, but is not limited to, ACS, aortic dissection, pulmonary embolism, cardiac tamponade, pneumothorax, pneumonia,  pericarditis, myocarditis, GI-related causes including esophagitis/gastritis, and musculoskeletal chest wall pain.    Patient currently denies pain.  She has had 2 sets of negative troponins and unremarkable EKG.  Will discharge home with close follow-up with cardiology.  Strict return precautions given.  Patient verbalizes understanding agrees with plan of care.  ____________________________________________   FINAL CLINICAL IMPRESSION(S) / ED DIAGNOSES  Final diagnoses:  Nonspecific chest pain     ED Discharge Orders    None       Note:  This document was prepared using Dragon voice recognition software and may include unintentional dictation errors.   Paulette Blanch, MD 07/19/18 [phone]

## 2018-07-20 ENCOUNTER — Other Ambulatory Visit (HOSPITAL_COMMUNITY)
Admission: RE | Admit: 2018-07-20 | Discharge: 2018-07-20 | Disposition: A | Discharge status: Home / Self Care | Payer: Medicare HMO | Source: Ambulatory Visit | Attending: Physician Assistant | Admitting: Physician Assistant

## 2018-07-20 ENCOUNTER — Ambulatory Visit (INDEPENDENT_AMBULATORY_CARE_PROVIDER_SITE_OTHER): Payer: Medicare HMO | Admitting: Physician Assistant

## 2018-07-20 ENCOUNTER — Other Ambulatory Visit: Payer: Self-pay

## 2018-07-20 ENCOUNTER — Encounter: Payer: Self-pay | Admitting: Physician Assistant

## 2018-07-20 VITALS — BP 120/62 | HR 65 | Temp 98.2°F | Resp 16 | Ht 69.5 in | Wt 136.0 lb

## 2018-07-20 DIAGNOSIS — F419 Anxiety disorder, unspecified: Secondary | ICD-10-CM | POA: Diagnosis not present

## 2018-07-20 DIAGNOSIS — N898 Other specified noninflammatory disorders of vagina: Secondary | ICD-10-CM

## 2018-07-20 DIAGNOSIS — F329 Major depressive disorder, single episode, unspecified: Secondary | ICD-10-CM | POA: Diagnosis not present

## 2018-07-20 DIAGNOSIS — F1011 Alcohol abuse, in remission: Secondary | ICD-10-CM

## 2018-07-20 DIAGNOSIS — F32A Depression, unspecified: Secondary | ICD-10-CM

## 2018-07-20 MED ORDER — HYDROXYZINE HCL 10 MG PO TABS: 10.0000 mg | ORAL_TABLET | Freq: Every day | ORAL | 0 refills | Status: DC | PRN

## 2018-07-20 NOTE — Progress Notes (Signed)
Patient: Jennifer [name] Female    DOB: 01-11-1951   68 y.o.   MRN: [id number] Visit Date: 07/21/2018  Today's Provider: Trinna Post, PA-C   Chief Complaint  Patient presents with  . Follow-up   Subjective:     HPI  Follow up for elevated blood pressure  The patient was last seen for this 5 days ago at the [facility] ER for chest pain. Patient bp was 167/84 at the ED. Patient was advised to continue monitoring blood pressure at home. Patient reports that yesterday bp was 147/77 at [facility].  Patient has recently completed alcohol detox at [facility] in [place], [place]. She is currently on venlafaxine 37.5 mg daily. She reports having continued anxiety. She went to [facility] walk in program and was told they didn't accept her insurance and that program would be out of pocket. She did not want to do this. Struggles with feeling anxious and on edge since detox from alcohol.   Reports she has a yellow vaginal discharge that has been present for months. She has had a new sexual partner.  ------------------------------------------------------------------------------------   No Known Allergies   Current Outpatient Medications:  .  alendronate (FOSAMAX) 70 MG tablet, TAKE 1 TABLET BY MOUTH ONCE A WEEK ON AN EMPTY STOMACH WITH  A  FULL  GLASS  OF  WATER, Disp: 12 tablet, Rfl: 0 .  rOPINIRole (REQUIP) 0.5 MG tablet, Take 1 tablet (0.5 mg total) by mouth at bedtime., Disp: 90 tablet, Rfl: 0 .  varenicline (CHANTIX STARTING MONTH PAK) 0.5 MG X 11 & 1 MG X 42 tablet, 0.5 mg tablet by mouth once daily for 3 days, then 0.5 mg tablet twice daily for 4 days, then increase to one 1 mg tablet twice daily., Disp: 53 tablet, Rfl: 0 .  venlafaxine (EFFEXOR) 37.5 MG tablet, Take 37.5 mg by mouth 2 (two) times daily., Disp: , Rfl:  .  atorvastatin (LIPITOR) 10 MG tablet, Take 1 tablet by mouth once daily, Disp: 90 tablet, Rfl: 0 .  hydrOXYzine (ATARAX/VISTARIL) 10 MG tablet, Take 1 tablet (10 mg  total) by mouth daily as needed., Disp: 30 tablet, Rfl: 0  Review of Systems  Social History   Tobacco Use  . Smoking status: Current Every Day Smoker    Packs/day: 0.75    Years: 46.35    Pack years: 34.76    Types: Cigarettes  . Smokeless tobacco: Never Used  Substance Use Topics  . Alcohol use: Yes    Alcohol/week: 12.0 standard drinks    Types: 12 Shots of liquor per week    Comment: occasional      Objective:   BP 120/62 (BP Location: Left Arm, Patient Position: Sitting, Cuff Size: Normal)   Pulse 65   Temp 98.2 F (36.8 C) (Oral)   Resp 16   Ht 5' 9.5" (1.765 m)   Wt 136 lb (61.7 kg)   BMI 19.80 kg/m  Vitals:   07/20/18 1449  BP: 120/62  Pulse: 65  Resp: 16  Temp: 98.2 F (36.8 C)  TempSrc: Oral  Weight: 136 lb (61.7 kg)  Height: 5' 9.5" (1.765 m)     Physical Exam Constitutional:      Appearance: Normal appearance.  Cardiovascular:     Rate and Rhythm: Normal rate and regular rhythm.     Heart sounds: Normal heart sounds.  Pulmonary:     Effort: Pulmonary effort is normal.     Breath sounds: Normal  breath sounds.  Skin:    General: Skin is warm and dry.  Neurological:     Mental Status: She is alert and oriented to person, place, and time. Mental status is at baseline.  Psychiatric:        Mood and Affect: Mood normal.        Behavior: Behavior normal.      No results found for any visits on 07/20/18.     Assessment & Plan    1. Anxiety and depression  Uncontrolled, recent detox from alcohol. I will refer to psychiatry and psychology. I believe she likely has significant unresolved grief from the death of her son.   - Ambulatory referral to Psychology - Ambulatory referral to Psychiatry  2. History of alcohol abuse  - Ambulatory referral to Psychology - Ambulatory referral to Psychiatry  3. Anxiety  - hydrOXYzine (ATARAX/VISTARIL) 10 MG tablet; Take 1 tablet (10 mg total) by mouth daily as needed.  Dispense: 30 tablet; Refill:  0  4. Vaginal discharge  - Cervicovaginal ancillary only  The entirety of the information documented in the History of Present Illness, Review of Systems and Physical Exam were personally obtained by me. Portions of this information were initially documented by Lynford Humphrey, CMA and reviewed by me for thoroughness and accuracy.   F/u 6 months chronic    Trinna Post, PA-C  [facility]

## 2018-07-20 NOTE — Patient Instructions (Signed)

## 2018-07-21 ENCOUNTER — Other Ambulatory Visit: Payer: Self-pay | Admitting: Family Medicine

## 2018-07-21 DIAGNOSIS — E7800 Pure hypercholesterolemia, unspecified: Secondary | ICD-10-CM

## 2018-07-22 LAB — CERVICOVAGINAL ANCILLARY ONLY
Bacterial vaginitis: NEGATIVE
Chlamydia: NEGATIVE
Neisseria Gonorrhea: NEGATIVE
Trichomonas: NEGATIVE

## 2018-07-25 ENCOUNTER — Ambulatory Visit: Payer: Self-pay | Admitting: *Deleted

## 2018-07-25 ENCOUNTER — Telehealth: Payer: Self-pay | Admitting: Physician Assistant

## 2018-07-25 DIAGNOSIS — B3731 Acute candidiasis of vulva and vagina: Secondary | ICD-10-CM

## 2018-07-25 DIAGNOSIS — B373 Candidiasis of vulva and vagina: Secondary | ICD-10-CM

## 2018-07-25 MED ORDER — TERCONAZOLE 0.4 % VA CREA: 1.0000 | TOPICAL_CREAM | Freq: Every day | VAGINAL | 0 refills | Status: AC

## 2018-07-25 NOTE — Patient Instructions (Signed)
Thank you allowing the Chronic Care Management Team to be a part of your care! It was a pleasure speaking with you today!  1. Please follow up with [facility] on 08/08/18 at 2pm 2. Please call this social worker with any questions or concerns regarding your mental health or community resource needs.  CCM (Chronic Care Management) Team   Trish Fountain RN, BSN Nurse Care Coordinator  [phone]  Ruben Reason PharmD  Clinical Pharmacist  [phone]   [place], LCSW Clinical Social Worker [phone]  Goals Addressed   None      The patient verbalized understanding of instructions provided today and declined a print copy of patient instruction materials.   No further follow up required: patient to follow up with [place] on 08/08/18 at 2pm

## 2018-07-25 NOTE — Telephone Encounter (Signed)
Attempted to contact [facility], on hold for 20 minutes. Will try again later.

## 2018-07-25 NOTE — Telephone Encounter (Signed)
Spoke with pt to relay this message. She agreed to send for yeast test "as long as my insurance pays for it"  Also, she is still waiting to hear regarding the change for anxiety meds.  Janae Sauce

## 2018-07-25 NOTE — Telephone Encounter (Signed)
Pt came in last week for anxiety.  She said she was told she was going to be prescribed another anxiety medication to take as needed.  When she picked it up from the pharmacy the medication was one she is already taking every six hrs.  She has been taking Hydroxyzine since she was in the hospital for anxiety June 19th..  She thought she was going to be prescribed something different.  CB#  [phone]  Thanks Con Memos

## 2018-07-25 NOTE — Telephone Encounter (Signed)
Can you call her and let her know the swab was negative but didn't test for yeast? I'm not sure what happened. I will send her in some vaginal cream for yeast. It is terconazole cream one applicator nightly for seven days.

## 2018-07-25 NOTE — Chronic Care Management (AMB) (Signed)
  Chronic Care Management   Social Work Note  07/25/2018 Name: Jennifer [name] MRN: [id number] DOB: [birth year]  Jennifer [name] is a 68 y.o. year old female who sees Trinna Post, [place] for primary care. The CCM team was consulted for assistance with [employer].   Phone call to patient to assess for patient's continued rehab and mental health needs. Per patient, she did go to a substance abuse program in [place] from 07/04/18-07/11/18. Per patient, she feels that she wished the program could have been longer to work through some emotional issues that she was going through. Patient states that she was referred to [facility] for after care, however states that she when called they said that they did not take her insurance. Patient reports being referred to [facility] but declined stating that she did not want to have therapy by phone. "seems so impersonal"  This social worker explained that the session would be by video and that due to [place] there may not be an office that is doing face to face visits yet. Patient gave this social worker verbal permission to call [facility] to follow up on referral as she now agrees to follow up.  Phone call to [facility], spoke with the referrals coordinator who contacted patient during the call and has scheduled an initial appointment with a therapist on 08/08/18 at 2pm.    Goals Addressed   None     Follow Up Plan: Patient to follow up with [place] on 08/08/18 at [place], [place] Worker  [facility] Care Management [phone]

## 2018-07-25 NOTE — Telephone Encounter (Signed)
-----   Message from Trinna Post, [place] sent at 07/22/2018  4:20 PM EDT ----- Vaginal swab is negative but yeast not performed. Can we call [facility] and have them perform the yeast test?

## 2018-07-25 NOTE — Telephone Encounter (Signed)
Test went to [facility].

## 2018-07-26 ENCOUNTER — Telehealth: Payer: Self-pay | Admitting: *Deleted

## 2018-07-26 NOTE — Telephone Encounter (Signed)
Pt came in last week for anxiety.  She said she was told she was going to be prescribed another anxiety medication to take as needed.  When she picked it up from the pharmacy the medication was one she is already taking every six hrs.  She has been taking Hydroxyzine since she was in the hospital for anxiety June 19th..  She thought she was going to be prescribed something different.  CB#  [phone]  Thanks Con Memos

## 2018-07-26 NOTE — Telephone Encounter (Signed)
Please advise message below? Patient states hydroxyzine 10 mg is supposed to be every 6 hours.

## 2018-07-26 NOTE — Telephone Encounter (Signed)
[email or web address]

## 2018-07-27 ENCOUNTER — Encounter: Payer: Self-pay | Admitting: *Deleted

## 2018-07-27 ENCOUNTER — Ambulatory Visit: Payer: Medicare HMO | Admitting: *Deleted

## 2018-07-27 DIAGNOSIS — F32A Depression, unspecified: Secondary | ICD-10-CM

## 2018-07-27 DIAGNOSIS — F419 Anxiety disorder, unspecified: Secondary | ICD-10-CM

## 2018-07-27 DIAGNOSIS — F1010 Alcohol abuse, uncomplicated: Secondary | ICD-10-CM

## 2018-07-27 DIAGNOSIS — F329 Major depressive disorder, single episode, unspecified: Secondary | ICD-10-CM

## 2018-07-27 MED ORDER — VENLAFAXINE HCL 37.5 MG PO TABS: 37.5000 mg | ORAL_TABLET | Freq: Two times a day (BID) | ORAL | 0 refills | Status: DC

## 2018-07-27 NOTE — Telephone Encounter (Signed)
Patient was advised.  

## 2018-07-27 NOTE — Telephone Encounter (Signed)
Patient reports she does not have venlafaxine, she is not sure she even had this medication. Please refill if appropriate.

## 2018-07-27 NOTE — Telephone Encounter (Signed)
She will need to sign a ROI then we can fax to [facility] [phone]

## 2018-07-27 NOTE — Chronic Care Management (AMB) (Signed)
  Chronic Care Management   Social Work Note  07/27/2018 Name: [name] MRN: [id number] DOB: 1951-01-25  Jennifer [name] is a 68 y.o. year old female who sees Trinna Post, [place] for primary care. The CCM team was consulted for assistance with Mental Health Counseling and Resources.  This Education officer, museum confirmed that patient has been  scheduled for counseling at [place] on 08/08/18 with Rollene Fare. Patient verbalized having no further care coordination needs at this time.  Goals Addressed   None     Follow Up Plan: Client will to follow up with [facility] for mental health counseling on 7/13/ 20 at [place], [place] Worker  [facility] Care Management [phone]

## 2018-07-27 NOTE — Telephone Encounter (Signed)
She is the one who told me she was taking this at 37.5 mg daily after being switched off it from zoloft. We need to request records from [facility] as patient is not a reliable historian. Can we call them please to request records of her admission?

## 2018-07-27 NOTE — Telephone Encounter (Signed)
She did not tell me she was taking this medication and it was not listed at the time. I advised this was the medication I was prescribing and that I intended to prescribe. This medication is usually used as needed for anxiety, not on a scheduled dose. If she has anxiety, she can take 20 mg of hydroxyzine at the time of anxiety. Additionally, the venlafaxine should begin to work in several weeks which should also help to control anxiety. She is also establishing with a Social worker. We will need to give these some time to work.

## 2018-07-29 ENCOUNTER — Telehealth: Payer: Medicare HMO

## 2018-08-01 ENCOUNTER — Telehealth: Payer: Self-pay

## 2018-08-01 NOTE — Telephone Encounter (Signed)
Patient calling that she was prescribed Venlafaxine and reports that she cannot take this. Patient reports it makes her feel "drunk" like she is not herself. Reports that she tried 1/2 tablet yesterday at bedtime and slept well if feels better with just the half but still feels like is in her system.

## 2018-08-01 NOTE — Telephone Encounter (Signed)
She can continue with half a pill or she can stop taking it and wait to follow up with psychiatry.

## 2018-08-01 NOTE — Telephone Encounter (Signed)
Patient advised as below. Patient verbalizes understanding and is in agreement with treatment plan.  

## 2018-08-08 ENCOUNTER — Ambulatory Visit (INDEPENDENT_AMBULATORY_CARE_PROVIDER_SITE_OTHER): Payer: Medicare HMO | Admitting: Licensed Clinical Social Worker

## 2018-08-08 DIAGNOSIS — F1021 Alcohol dependence, in remission: Secondary | ICD-10-CM | POA: Diagnosis not present
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
## 2018-08-18 ENCOUNTER — Ambulatory Visit (INDEPENDENT_AMBULATORY_CARE_PROVIDER_SITE_OTHER): Payer: Medicare HMO | Admitting: Licensed Clinical Social Worker

## 2018-08-18 DIAGNOSIS — F1021 Alcohol dependence, in remission: Secondary | ICD-10-CM

## 2018-08-18 DIAGNOSIS — F331 Major depressive disorder, recurrent, moderate: Secondary | ICD-10-CM

## 2018-08-18 NOTE — Progress Notes (Signed)
Patient ID: Jennifer [name], female   DOB: [birth year], 68 y.o.   MRN: [id number]  Virtual Visit via Telephone Note  I connected with Jennifer [name] on 08/18/18 at  4:00 PM EDT by telephone and verified that I am speaking with the correct person using two identifiers.  I discussed the limitations, risks, security and privacy concerns of performing an evaluation and management service by telephone and the availability of in person appointments. I also discussed with the patient that there may be a patient responsible charge related to this service. The patient expressed understanding and agreed to proceed.  I discussed the assessment and treatment plan with the patient. The patient was provided an opportunity to ask questions and all were answered. The patient agreed with the plan and demonstrated an understanding of the instructions.   The patient was advised to call back or seek an in-person evaluation if the symptoms worsen or if the condition fails to improve as anticipated.   Type of Therapy: Individual Therapy  Treatment Goals addressed:  Refrain from using alcohol as a coping mechanism   Interventions: Relapse Prevention Therapy,  Supportive Counseling  Summary: Jennifer [name] is a 68 y.o. female who presents with symptoms of recent relapse and depressed mood.  Therapist Response:  Patient met with clinician for an individual session. Patient reports that she is not having cravings, but that she does have some bad days and has not had a drink on those days. Counselor guided patient to discuss her relationship, which she states has been the biggest trigger for her drinking. Patient indicates that things have been going better in her relationship, because he has been "acting more like a boyfriend". She reports that she knows this may not last, but she is enjoying it while it does. Counselor and patient explored what may happen if this changes. Counselor emphasized to her the importance of being  intentional in the relationship. Patient shared her difficulties with trust, based on past relationships, and her struggles with trusting current boyfriend. Counselor explored with patient ways that distrust can trigger her to sabotage, and ways this has led to drinking in the past. Counselor guided patient in creating a plan for dealing with disappointment and anxiety in the relationship, which have been two of her main triggers. Patient verbalized recognizing the ways in which the relationship, drinking and depression have been intertwined.     Suicidal/Homicidal:  No   Recommendation and plan:  Continued sessions every other week.  Diagnosis:  Alcohol Dependence in Early Remission Major Depressive Disorder, Recurrent, Moderate  I provided 53 minutes of non-face-to-face time during this encounter.   Lillie Fragmin, LCSW

## 2018-08-23 ENCOUNTER — Ambulatory Visit (INDEPENDENT_AMBULATORY_CARE_PROVIDER_SITE_OTHER): Payer: Medicare HMO | Admitting: Licensed Clinical Social Worker

## 2018-08-23 DIAGNOSIS — F331 Major depressive disorder, recurrent, moderate: Secondary | ICD-10-CM

## 2018-08-23 DIAGNOSIS — F10220 Alcohol dependence with intoxication, uncomplicated: Secondary | ICD-10-CM
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
## 2018-08-24 ENCOUNTER — Other Ambulatory Visit: Payer: Self-pay

## 2018-08-24 ENCOUNTER — Emergency Department
Admission: EM | Admit: 2018-08-24 | Discharge: 2018-08-25 | Disposition: A | Discharge status: Home / Self Care | Payer: Medicare HMO | Attending: Emergency Medicine | Admitting: Emergency Medicine

## 2018-08-24 DIAGNOSIS — F332 Major depressive disorder, recurrent severe without psychotic features: Secondary | ICD-10-CM | POA: Diagnosis not present

## 2018-08-24 DIAGNOSIS — R0902 Hypoxemia: Secondary | ICD-10-CM | POA: Diagnosis not present

## 2018-08-24 DIAGNOSIS — Z85828 Personal history of other malignant neoplasm of skin: Secondary | ICD-10-CM | POA: Diagnosis not present

## 2018-08-24 DIAGNOSIS — J449 Chronic obstructive pulmonary disease, unspecified: Secondary | ICD-10-CM | POA: Insufficient documentation

## 2018-08-24 DIAGNOSIS — Z720 Tobacco use: Secondary | ICD-10-CM | POA: Diagnosis present

## 2018-08-24 DIAGNOSIS — F329 Major depressive disorder, single episode, unspecified: Secondary | ICD-10-CM | POA: Insufficient documentation

## 2018-08-24 DIAGNOSIS — I10 Essential (primary) hypertension: Secondary | ICD-10-CM | POA: Diagnosis not present

## 2018-08-24 DIAGNOSIS — F29 Unspecified psychosis not due to a substance or known physiological condition: Secondary | ICD-10-CM | POA: Diagnosis not present

## 2018-08-24 DIAGNOSIS — Z79899 Other long term (current) drug therapy: Secondary | ICD-10-CM | POA: Insufficient documentation

## 2018-08-24 DIAGNOSIS — F1010 Alcohol abuse, uncomplicated: Secondary | ICD-10-CM | POA: Diagnosis not present

## 2018-08-24 DIAGNOSIS — I2510 Atherosclerotic heart disease of native coronary artery without angina pectoris: Secondary | ICD-10-CM | POA: Insufficient documentation

## 2018-08-24 DIAGNOSIS — R457 State of emotional shock and stress, unspecified: Secondary | ICD-10-CM | POA: Diagnosis not present

## 2018-08-24 DIAGNOSIS — F17210 Nicotine dependence, cigarettes, uncomplicated: Secondary | ICD-10-CM | POA: Diagnosis not present

## 2018-08-24 DIAGNOSIS — F1020 Alcohol dependence, uncomplicated: Secondary | ICD-10-CM | POA: Diagnosis not present

## 2018-08-24 DIAGNOSIS — F17200 Nicotine dependence, unspecified, uncomplicated: Secondary | ICD-10-CM | POA: Diagnosis present

## 2018-08-24 DIAGNOSIS — R0989 Other specified symptoms and signs involving the circulatory and respiratory systems: Secondary | ICD-10-CM | POA: Diagnosis not present

## 2018-08-24 DIAGNOSIS — F32A Depression, unspecified: Secondary | ICD-10-CM

## 2018-08-24 LAB — CBC
HCT: 48.6 % — ABNORMAL HIGH (ref 36.0–46.0)
Hemoglobin: 16.2 g/dL — ABNORMAL HIGH (ref 12.0–15.0)
MCH: 30.4 pg (ref 26.0–34.0)
MCHC: 33.3 g/dL (ref 30.0–36.0)
MCV: 91.2 fL (ref 80.0–100.0)
Platelets: 235 10*3/uL (ref 150–400)
RBC: 5.33 MIL/uL — ABNORMAL HIGH (ref 3.87–5.11)
RDW: 13.2 % (ref 11.5–15.5)
WBC: 8.1 10*3/uL (ref 4.0–10.5)
nRBC: 0 % (ref 0.0–0.2)

## 2018-08-24 LAB — URINE DRUG SCREEN, QUALITATIVE (ARMC ONLY)
Amphetamines, Ur Screen: NOT DETECTED
Barbiturates, Ur Screen: NOT DETECTED
Benzodiazepine, Ur Scrn: NOT DETECTED
Cannabinoid 50 Ng, Ur ~~LOC~~: NOT DETECTED
Cocaine Metabolite,Ur ~~LOC~~: NOT DETECTED
MDMA (Ecstasy)Ur Screen: NOT DETECTED
Methadone Scn, Ur: NOT DETECTED
Opiate, Ur Screen: NOT DETECTED
Phencyclidine (PCP) Ur S: NOT DETECTED
Tricyclic, Ur Screen: NOT DETECTED

## 2018-08-24 LAB — COMPREHENSIVE METABOLIC PANEL
ALT: 16 U/L (ref 0–44)
AST: 30 U/L (ref 15–41)
Albumin: 4.5 g/dL (ref 3.5–5.0)
Alkaline Phosphatase: 79 U/L (ref 38–126)
Anion gap: 12 (ref 5–15)
BUN: 15 mg/dL (ref 8–23)
CO2: 26 mmol/L (ref 22–32)
Calcium: 8.9 mg/dL (ref 8.9–10.3)
Chloride: 106 mmol/L (ref 98–111)
Creatinine, Ser: 0.78 mg/dL (ref 0.44–1.00)
GFR calc Af Amer: >60 mL/min (ref >60)
GFR calc non Af Amer: >60 mL/min (ref >60)
Glucose, Bld: 94 mg/dL (ref 70–99)
Potassium: 3.9 mmol/L (ref 3.5–5.1)
Sodium: 144 mmol/L (ref 135–145)
Total Bilirubin: 0.4 mg/dL (ref 0.3–1.2)
Total Protein: 7.7 g/dL (ref 6.5–8.1)

## 2018-08-24 LAB — ACETAMINOPHEN LEVEL: Acetaminophen (Tylenol), Serum: <10 ug/mL — ABNORMAL LOW (ref 10–30)

## 2018-08-24 LAB — SALICYLATE LEVEL: Salicylate Lvl: <7 mg/dL (ref 2.8–30.0)

## 2018-08-24 LAB — ETHANOL: Alcohol, Ethyl (B): 330 mg/dL (ref <10)

## 2018-08-24 MED ORDER — HYDROXYZINE HCL 10 MG PO TABS
10.0000 mg | ORAL_TABLET | Freq: Three times a day (TID) | ORAL | Status: DC | PRN
  Administered 2018-08-24: 10 mg via ORAL
  Filled 2018-08-24 (×4): qty 1

## 2018-08-24 MED ORDER — VENLAFAXINE HCL 37.5 MG PO TABS
37.5000 mg | ORAL_TABLET | Freq: Two times a day (BID) | ORAL | Status: DC
  Administered 2018-08-24 – 2018-08-25 (×2): 37.5 mg via ORAL
  Filled 2018-08-24 (×2): qty 1
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
## 2018-08-24 NOTE — ED Triage Notes (Signed)
Patient arrived to ED by [facility] from home with complaint of depression. Patient clearly intoxicated with smell of [place]. Per EMS patient has drank 1/2 fifth of Vodka and having depression due to break up 3 months ago. Patient denies SI/HI/AVH. Patient is tearful and states she just don't care anymore. When asked about family patient states her only child (son) pasted away 20 years ago when he was 32. And then started crying even more and stating she just don't care anymore and don't feel anything. This Probation officer and ED [place] dressed patient out in behavioral scrubs and placed belongings in bag labeled with patient info.

## 2018-08-24 NOTE — ED Notes (Signed)
Pt walking in rm. Instructed pt multiple times to sit on bed to avoid falling. Pt sitting on bed and crying at this time. Will continue to monitor Q15 min rounds.

## 2018-08-24 NOTE — ED Provider Notes (Signed)
[facility] Emergency Department Provider Note  ____________________________________________   First MD Initiated Contact with Patient 08/24/18 2030     (approximate)  I have reviewed the triage vital signs and the nursing notes.   HISTORY  Chief Complaint Depression and Alcohol Intoxication    HPI Jennifer [name] is a 68 y.o. female with arthritis who presents from home with concern of depression.  Patient is intoxicated and smells of [place].  Per EMS patient drinks one half fifth of vodka.  Patient is depressed due to a break-up 3 months ago.  Patient endorses feeling depressed and having thoughts of hurting herself.  She says there is no point in living any longer.  She denies having any plan for killing herself.  She does endorse using alcohol tonight.  Denies drugs.  Her depression is severe, constant, nothing makes it better, nothing makes it worse.  She denies any other symptoms of abdominal pain, chest pain, fevers, urinary symptoms.  She is upset due to her past relationship break-up.    Past Medical History:  Diagnosis Date  . Arthritis    mild  . Cancer (HCC)    skin cancer leg and chest  . Dysrhythmia    palpitation  . Family history of colon cancer   . Headache    as  a child   . Heart murmur    while pregnant  . Hyperlipidemia 07/17/2017  . Tobacco abuse     Patient Active Problem List   Diagnosis Date Noted  . Aortic atherosclerosis ([place]) 07/19/2018  . Hypertension 04/07/2018  . Osteoporosis 01/03/2018  . COPD (chronic obstructive pulmonary disease) ([place]) 11/02/2017  . Umbilical hernia s/p primary repair 07/16/2017 07/17/2017  . Hyperlipidemia 07/17/2017  . Arthritis   . Adenomatous polyp of ascending colon s/p right colectomy 07/16/2017 07/16/2017  . Genetic testing 05/13/2017  . History of colonic polyps 05/03/2017  . Family history of colon cancer   . Special screening for malignant neoplasms, colon   . Tobacco abuse  03/10/2017    Past Surgical History:  Procedure Laterality Date  . ABDOMINAL HYSTERECTOMY     1996  . COLONOSCOPY WITH PROPOFOL N/A 03/29/2017   Procedure: COLONOSCOPY WITH PROPOFOL;  Surgeon: Lin Landsman, MD;  Location: [facility] ENDOSCOPY;  Service: Gastroenterology;  Laterality: N/A;  . HEMICOLECTOMY     righ  t6-21-19  . LAPAROSCOPIC RIGHT HEMI COLECTOMY Right 07/16/2017   Procedure: LAPAROSCOPIC RIGHT HEMI COLECTOMY;  Surgeon: Ileana Roup, MD;  Location: [facility] ORS;  Service: General;  Laterality: Right;  . TUBAL LIGATION      Prior to Admission medications   Medication Sig Start Date End Date Taking? Authorizing Provider  alendronate (FOSAMAX) 70 MG tablet TAKE 1 TABLET BY MOUTH ONCE A WEEK ON AN EMPTY STOMACH WITH  A  FULL  GLASS  OF  WATER 05/31/18   Carles Collet M, PA-C  atorvastatin (LIPITOR) 10 MG tablet Take 1 tablet by mouth once daily 07/21/18   Trinna Post, PA-C  hydrOXYzine (ATARAX/VISTARIL) 10 MG tablet Take 1 tablet (10 mg total) by mouth daily as needed. 07/20/18   Trinna Post, PA-C  rOPINIRole (REQUIP) 0.5 MG tablet Take 1 tablet (0.5 mg total) by mouth at bedtime. 06/23/18 09/21/18  Trinna Post, PA-C  varenicline (CHANTIX STARTING MONTH PAK) 0.5 MG X 11 & 1 MG X 42 tablet 0.5 mg tablet by mouth once daily for 3 days, then 0.5 mg tablet twice daily for 4 days,  then increase to one 1 mg tablet twice daily. 03/07/18   Trinna Post, PA-C  venlafaxine (EFFEXOR) 37.5 MG tablet Take 1 tablet (37.5 mg total) by mouth 2 (two) times daily. 07/27/18   Trinna Post, PA-C    Allergies Patient has no known allergies.  Family History  Problem Relation Age of Onset  . Diabetes Mother   . Kidney disease Mother   . Heart attack Father   . Diabetes Maternal Grandmother   . Colon cancer Maternal Grandfather   . Heart disease Paternal Grandmother   . Colon cancer Paternal Uncle        dx >50, colon completely removed  . Colon cancer Paternal Aunt 49     Social History Social History   Tobacco Use  . Smoking status: Current Every Day Smoker    Packs/day: 0.75    Years: 46.35    Pack years: 34.76    Types: Cigarettes  . Smokeless tobacco: Never Used  Substance Use Topics  . Alcohol use: Yes    Alcohol/week: 12.0 standard drinks    Types: 12 Shots of liquor per week    Comment: occasional  . Drug use: No      Review of Systems Constitutional: No fever/chills Eyes: No visual changes. ENT: No sore throat. Cardiovascular: Denies chest pain. Respiratory: Denies shortness of breath. Gastrointestinal: No abdominal pain.  No nausea, no vomiting.  No diarrhea.  No constipation. Genitourinary: Negative for dysuria. Musculoskeletal: Negative for back pain. Skin: Negative for rash. Neurological: Negative for headaches, focal weakness or numbness. Psych: Positive for depression, SI All other ROS negative ____________________________________________   PHYSICAL EXAM:  VITAL SIGNS: ED Triage Vitals  Enc Vitals Group     BP 08/24/18 2029 (!) 142/82     Pulse Rate 08/24/18 2029 78     Resp 08/24/18 2029 20     Temp 08/24/18 2029 98.3 F (36.8 C)     Temp Source 08/24/18 2029 Oral     SpO2 08/24/18 2023 91 %     Weight 08/24/18 2030 130 lb (59 kg)     Height 08/24/18 2030 5\' 10"  (1.778 m)     Head Circumference --      Peak Flow --      Pain Score 08/24/18 2030 0     Pain Loc --      Pain Edu? --      Excl. in [place]? --     Constitutional: Alert and oriented.  Crying Eyes: Conjunctivae are normal. EOMI. Head: Atraumatic. Nose: No congestion/rhinnorhea. Mouth/Throat: Mucous membranes are moist.   Neck: No stridor. Trachea Midline. FROM Cardiovascular: Normal rate, regular rhythm. Grossly normal heart sounds.  Good peripheral circulation. Respiratory: Normal respiratory effort.  No retractions. Lungs CTAB. Gastrointestinal: Soft and nontender. No distention. No abdominal bruits.  Musculoskeletal: No lower extremity  tenderness nor edema.  No joint effusions. Neurologic:  Normal speech and language. No gross focal neurologic deficits are appreciated.  Skin:  Skin is warm, dry and intact. No rash noted. Psychiatric: Patient is tearful, endorses SI, denies HI, AH, VH GU: Deferred   ____________________________________________   LABS (all labs ordered are listed, but only abnormal results are displayed)  Labs Reviewed  ETHANOL - Abnormal; Notable for the following components:      Result Value   Alcohol, Ethyl (B) 330 (*)    All other components within normal limits  ACETAMINOPHEN LEVEL - Abnormal; Notable for the following components:   Acetaminophen (Tylenol), Serum <10 (*)  All other components within normal limits  CBC - Abnormal; Notable for the following components:   RBC 5.33 (*)    Hemoglobin 16.2 (*)    HCT 48.6 (*)    All other components within normal limits  COMPREHENSIVE METABOLIC PANEL  SALICYLATE LEVEL  URINE DRUG SCREEN, QUALITATIVE ([facility] ONLY)    PROCEDURES  Procedure(s) performed (including Critical Care):  Procedures   ____________________________________________   INITIAL IMPRESSION / ASSESSMENT AND PLAN / ED COURSE  Jennifer [name] was evaluated in Emergency Department on 08/24/2018 for the symptoms described in the history of present illness. She was evaluated in the context of the global COVID-19 pandemic, which necessitated consideration that the patient might be at risk for infection with the SARS-CoV-2 virus that causes COVID-19. Institutional protocols and algorithms that pertain to the evaluation of patients at risk for COVID-19 are in a state of rapid change based on information released by regulatory bodies including the CDC and federal and state organizations. These policies and algorithms were followed during the patient's care in the ED.    Pt is without any acute medical complaints. No exam findings to suggest medical cause of current presentation. Will  order psychiatric screening labs and discuss further w/ psychiatric service.  D/d includes but is not limited to psychiatric disease, behavioral/personality disorder, inadequate socioeconomic support, medical.  Based on HPI, exam, unremarkable labs, no concern for acute medical problem at this time. No rigidity, clonus, hyperthermia, focal neurologic deficit, diaphoresis, tachycardia, meningismus, ataxia, gait abnormality or other finding to suggest this visit represents a non-psychiatric problem. Screening labs reviewed.    Given this, pt medically cleared, to be dispositioned per Psych.  Ethanol level noted to be 330.  Salicylate and Tylenol levels are negative.  Given patient is intoxicated with SI will IVC patient.  ____________________________________________   FINAL CLINICAL IMPRESSION(S) / ED DIAGNOSES   Final diagnoses:  ETOH abuse  Depression, unspecified depression type      MEDICATIONS GIVEN DURING THIS VISIT:  Medications - No data to display   ED Discharge Orders    None       Note:  This document was prepared using Dragon voice recognition software and may include unintentional dictation errors.   Vanessa Homewood, MD 08/24/18 2139

## 2018-08-25 ENCOUNTER — Inpatient Hospital Stay: Admission: RE | Admit: 2018-08-25 | Payer: Medicare HMO | Source: Intra-hospital | Admitting: Psychiatry

## 2018-08-25 DIAGNOSIS — F329 Major depressive disorder, single episode, unspecified: Secondary | ICD-10-CM | POA: Diagnosis not present

## 2018-08-25 DIAGNOSIS — F1010 Alcohol abuse, uncomplicated: Secondary | ICD-10-CM | POA: Diagnosis not present

## 2018-08-25 MED ORDER — NICOTINE 14 MG/24HR TD PT24
14.0000 mg | MEDICATED_PATCH | Freq: Once | TRANSDERMAL | Status: DC
  Administered 2018-08-25: 14 mg via TRANSDERMAL
  Filled 2018-08-25: qty 1

## 2018-08-25 NOTE — ED Notes (Signed)
Report to include Situation, Background, Assessment, and Recommendations received from [facility]. Patient alert and oriented, warm and dry, in no acute distress. Patient denies SI, HI, AVH and pain. She is here for depression and alcohol intoxication. Patient made aware of Q15 minute rounds and Engineer, drilling presence for their safety. Patient instructed to come to me with needs or concerns.

## 2018-08-25 NOTE — ED Notes (Signed)
Hourly rounding reveals patient in room. No complaints, stable, in no acute distress. Q15 minute rounds and monitoring via Rover and Officer to continue.   

## 2018-08-25 NOTE — ED Provider Notes (Signed)
Patient has been cleared by psychiatry for discharge.   Earleen Newport, MD 08/25/18 [phone]
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
## 2018-08-25 NOTE — ED Notes (Signed)
Pt discharged with cab voucher. VS stable. Pt denies SI/HI.  All belongings returned to patient. Discharge instructions reviewed with patient. Patient signed for discharge.

## 2018-08-25 NOTE — Consult Note (Signed)
Jennifer Jennifer [name] Psychiatry Consult   Reason for Consult:  Intoxicated with Jennifer [name] Referring Physician:  EDP Patient Identification: Jennifer Jennifer [name] Jennifer [name] MRN:  [id number] Principal Diagnosis: Alcohol abuse, episodic drinking behavior Diagnosis:  Principal Problem:   Alcohol abuse, episodic drinking behavior Active Problems:   Tobacco abuse   Total Time spent with patient: 20 minutes  Subjective:   Jennifer Jennifer [name] Jennifer [name] is a 68 y.o. female patient reports that last night she got intoxicated and making comments that she should have.  She states that she get into rehab approximately 1 month ago and yesterday with the first time that she is relapsed.  She states she drinks almost 1/5 of liquor.  She denies any suicidal or homicidal ideations and denies any hallucinations.  She states that she lives with her father and helps take care of her.  She states that she is ready to be discharged and she will get to the house with.  She reports that she is already seeing a psychiatrist remember the name of the place that she goes to but the psychiatrist's name is Rollene Fare.  HPI:  Per Dr. Jari Pigg; Satira Mccallum a 68 y.o.femalewith arthritis who presents from home with concern of Jennifer [name]. Patient is intoxicated and smells of EtOH. Per EMS patient drinks one half fifth of vodka. Patient is depressed due to a break-up 3 months ago.  Patient is seen by this provider face-to-face.  Patient continues to deny any suicidal homicidal ideation denies any hallucinations.  Patient states that she feels much better today .  Patient does have a safe place to stay and reports that a friend by the name of Maureen Ralphs that may be able to pick her up possibly today and requests that we contact him. Mr. Chiquita Loth was contacted for collateral information last night and there was no safety concerns with the patient being discharged. TTS staff will be contacting Mr. Fitch. Discussed with Dr. Jimmye Norman and he has agreed to rescind the IVC.  Patient does not meet inpatient criteria and is psychiatrically cleared.   Past Psychiatric History: substance abuse, Jennifer [name]  Risk to Self: Suicidal Ideation: No Suicidal Intent: No Is patient at risk for suicide?: No Suicidal Plan?: No Access to Means: No What has been your use of drugs/alcohol within the last 12 months?: Use of alcohol How many times?: 0 Other Self Harm Risks: denied Triggers for Past Attempts: None known Intentional Self Injurious Behavior: None Risk to Others: Homicidal Ideation: No Thoughts of Harm to Others: No Current Homicidal Intent: No Current Homicidal Plan: No Access to Homicidal Means: No Identified Victim: None identified History of harm to others?: No Assessment of Violence: None Noted Does patient have access to weapons?: No Criminal Charges Pending?: No Does patient have a court date: No Prior Inpatient Therapy: Prior Inpatient Therapy: No Prior Outpatient Therapy: Prior Outpatient Therapy: Yes Prior Therapy Dates: Currently Prior Therapy Facilty/Provider(s): Unknown - Dr. Rollene Fare Reason for Treatment: Jennifer [name], Substance abuse Does patient have an ACCT team?: No Does patient have Intensive In-House Services?  : No Does patient have [facility] services? : No Does patient have P4CC services?: No  Past Medical History:  Past Medical History:  Diagnosis Date  . Arthritis    mild  . Cancer (HCC)    skin cancer leg and chest  . Dysrhythmia    palpitation  . Family history of colon cancer   . Headache    as  a child   . Heart murmur    while pregnant  .  Hyperlipidemia 07/17/2017  . Tobacco abuse     Past Surgical History:  Procedure Laterality Date  . ABDOMINAL HYSTERECTOMY     1996  . COLONOSCOPY WITH PROPOFOL N/A 03/29/2017   Procedure: COLONOSCOPY WITH PROPOFOL;  Surgeon: Lin Landsman, MD;  Location: [facility] ENDOSCOPY;  Service: Gastroenterology;  Laterality: N/A;  . HEMICOLECTOMY     righ  [id number]  . LAPAROSCOPIC RIGHT  HEMI COLECTOMY Right 07/16/2017   Procedure: LAPAROSCOPIC RIGHT HEMI COLECTOMY;  Surgeon: Ileana Roup, MD;  Location: [facility] ORS;  Service: General;  Laterality: Right;  . TUBAL LIGATION     Family History:  Family History  Problem Relation Age of Onset  . Diabetes Mother   . Kidney disease Mother   . Heart attack Father   . Diabetes Maternal Grandmother   . Colon cancer Maternal Grandfather   . Heart disease Paternal Grandmother   . Colon cancer Paternal Uncle        dx >50, colon completely removed  . Colon cancer Paternal Aunt 21   Family Psychiatric  History: Denies Social History:  Social History   Substance and Sexual Activity  Alcohol Use Yes  . Alcohol/week: 12.0 standard drinks  . Types: 12 Shots of liquor per week   Comment: occasional     Social History   Substance and Sexual Activity  Drug Use No    Social History   Socioeconomic History  . Marital status: Single    Spouse name: Not on file  . Number of children: Not on file  . Years of education: Not on file  . Highest education level: Not on file  Occupational History  . Not on file  Social Needs  . Financial resource strain: Not on file  . Food insecurity    Worry: Not on file    Inability: Not on file  . Transportation needs    Medical: Not on file    Non-medical: Not on file  Tobacco Use  . Smoking status: Current Every Day Smoker    Packs/day: 0.75    Years: 46.35    Pack years: 34.76    Types: Cigarettes  . Smokeless tobacco: Never Used  Substance and Sexual Activity  . Alcohol use: Yes    Alcohol/week: 12.0 standard drinks    Types: 12 Shots of liquor per week    Comment: occasional  . Drug use: No  . Sexual activity: Not Currently  Lifestyle  . Physical activity    Days per week: Not on file    Minutes per session: Not on file  . Stress: Not on file  Relationships  . Social Herbalist on phone: Not on file    Gets together: Not on file    Attends religious  service: Not on file    Active member of club or organization: Not on file    Attends meetings of clubs or organizations: Not on file    Relationship status: Not on file  Other Topics Concern  . Not on file  Social History Narrative  . Not on file   Additional Social History:    Allergies:  No Known Allergies  Labs:  Results for orders placed or performed during the hospital encounter of 08/24/18 (from the past 48 hour(s))  Comprehensive metabolic panel     Status: None   Collection Time: 08/24/18  8:25 PM  Result Value Ref [place]   Sodium 144 135 - 145 mmol/L   Potassium 3.9 3.5 -  5.1 mmol/L   Chloride 106 98 - 111 mmol/L   CO2 26 22 - 32 mmol/L   Glucose, Bld 94 70 - 99 mg/dL   BUN 15 8 - 23 mg/dL   Creatinine, Ser 0.78 0.44 - 1.00 mg/dL   Calcium 8.9 8.9 - 10.3 mg/dL   Total Protein 7.7 6.5 - 8.1 g/dL   Albumin 4.5 3.5 - 5.0 g/dL   AST 30 15 - 41 U/L   ALT 16 0 - 44 U/L   Alkaline Phosphatase 79 38 - 126 U/L   Total Bilirubin 0.4 0.3 - 1.2 mg/dL   GFR calc non Af Amer >60 >60 mL/min   GFR calc Af Amer >60 >60 mL/min   Anion gap 12 5 - 15    Comment: Performed at [facility], [address] [place], [place] [address]  Ethanol     Status: Abnormal   Collection Time: 08/24/18  8:25 PM  Result Value Ref [place]   Alcohol, Ethyl (B) 330 (HH) <10 mg/dL    Comment: CRITICAL RESULT CALLED TO, READ BACK BY AND VERIFIED WITH KIMBERLY SUMMERS 08/24/18 @ 2052  MLK (NOTE) Lowest detectable limit for serum alcohol is 10 mg/dL. For medical purposes only. Performed at [facility], [address] [place], [place] [address]   Salicylate level     Status: None   Collection Time: 08/24/18  8:25 PM  Result Value Ref [place]   Salicylate Lvl <1.9 2.8 - 30.0 mg/dL    Comment: Performed at [facility], [address] [place], [place] [address]  Acetaminophen level     Status: Abnormal   Collection Time: 08/24/18  8:25 PM  Result Value Ref [place]    Acetaminophen (Tylenol), Serum <10 (L) 10 - 30 ug/mL    Comment: (NOTE) Therapeutic concentrations vary significantly. A range of 10-30 ug/mL  may be an effective concentration for many patients. However, some  are best treated at concentrations outside of this range. Acetaminophen concentrations >150 ug/mL at 4 hours after ingestion  and >50 ug/mL at 12 hours after ingestion are often associated with  toxic reactions. Performed at [facility], [address] [place], [place] [address]   cbc     Status: Abnormal   Collection Time: 08/24/18  8:25 PM  Result Value Ref [place]   WBC 8.1 4.0 - 10.5 K/uL   RBC 5.33 (H) 3.87 - 5.11 MIL/uL   Hemoglobin 16.2 (H) 12.0 - 15.0 g/dL   HCT 48.6 (H) 36.0 - 46.0 %   MCV 91.2 80.0 - 100.0 fL   MCH 30.4 26.0 - 34.0 pg   MCHC 33.3 30.0 - 36.0 g/dL   RDW 13.2 11.5 - 15.5 %   Platelets 235 150 - 400 K/uL   nRBC 0.0 0.0 - 0.2 %    Comment: Performed at [facility], [address] [place], [place] [address]  Urine Drug Screen, Qualitative     Status: None   Collection Time: 08/24/18 10:23 PM  Result Value Ref [place]   Tricyclic, Ur Screen NONE DETECTED NONE DETECTED   Amphetamines, Ur Screen NONE DETECTED NONE DETECTED   MDMA (Ecstasy)Ur Screen NONE DETECTED NONE DETECTED   Cocaine Metabolite,Ur [place] NONE DETECTED NONE DETECTED   Opiate, Ur Screen NONE DETECTED NONE DETECTED   Phencyclidine (PCP) Ur S NONE DETECTED NONE DETECTED   Cannabinoid 50 Ng, Ur [place] NONE DETECTED NONE DETECTED   Barbiturates, Ur Screen NONE DETECTED NONE DETECTED   Benzodiazepine, Ur  Scrn NONE DETECTED NONE DETECTED   Methadone Scn, Ur NONE DETECTED NONE DETECTED    Comment: (NOTE) Tricyclics + metabolites, urine    Cutoff 1000 ng/mL Amphetamines + metabolites, urine  Cutoff 1000 ng/mL MDMA (Ecstasy), urine              Cutoff 500 ng/mL Cocaine Metabolite, urine          Cutoff 300 ng/mL Opiate + metabolites, urine        Cutoff 300 ng/mL Phencyclidine  (PCP), urine         Cutoff 25 ng/mL Cannabinoid, urine                 Cutoff 50 ng/mL Barbiturates + metabolites, urine  Cutoff 200 ng/mL Benzodiazepine, urine              Cutoff 200 ng/mL Methadone, urine                   Cutoff 300 ng/mL The urine drug screen provides only a preliminary, unconfirmed analytical test result and should not be used for non-medical purposes. Clinical consideration and professional judgment should be applied to any positive drug screen result due to possible interfering substances. A more specific alternate chemical method must be used in order to obtain a confirmed analytical result. Gas chromatography / mass spectrometry (GC/MS) is the preferred confirmat ory method. Performed at [facility], [address] [place], [place] [address]     Current Facility-Administered Medications  Medication Dose Route Frequency Provider Last Rate Last Dose  . hydrOXYzine (ATARAX/VISTARIL) tablet 10 mg  10 mg Oral TID PRN Vanessa [place], MD   10 mg at 08/24/18 2347  . nicotine (NICODERM CQ - dosed in mg/24 hours) patch 14 mg  14 mg Transdermal Once Alfred Levins, [place], MD   14 mg at 08/25/18 0017  . venlafaxine (EFFEXOR) tablet 37.5 mg  37.5 mg Oral BID Vanessa Weld, MD   37.5 mg at 08/25/18 1028   Current Outpatient Medications  Medication Sig Dispense Refill  . alendronate (FOSAMAX) 70 MG tablet TAKE 1 TABLET BY MOUTH ONCE A WEEK ON AN EMPTY STOMACH WITH  A  FULL  GLASS  OF  WATER 12 tablet 0  . atorvastatin (LIPITOR) 10 MG tablet Take 1 tablet by mouth once daily 90 tablet 0  . hydrOXYzine (ATARAX/VISTARIL) 10 MG tablet Take 1 tablet (10 mg total) by mouth daily as needed. 30 tablet 0  . rOPINIRole (REQUIP) 0.5 MG tablet Take 1 tablet (0.5 mg total) by mouth at bedtime. 90 tablet 0  . varenicline (CHANTIX STARTING MONTH PAK) 0.5 MG X 11 & 1 MG X 42 tablet 0.5 mg tablet by mouth once daily for 3 days, then 0.5 mg tablet twice daily for 4 days, then  increase to one 1 mg tablet twice daily. 53 tablet 0  . venlafaxine (EFFEXOR) 37.5 MG tablet Take 1 tablet (37.5 mg total) by mouth 2 (two) times daily. 60 tablet 0    Musculoskeletal: Strength & Muscle Tone: within normal limits Gait & Station: Stayed in bed during evaluation Patient leans: N/A  Psychiatric Specialty Exam: Physical Exam  Nursing note and vitals reviewed. Constitutional: She is oriented to person, place, and time. She appears well-developed and well-nourished.  Cardiovascular: Normal rate.  Respiratory: Effort normal.  Musculoskeletal: Normal range of motion.  Neurological: She is alert and oriented to person, place, and time.  Skin: Skin is warm.    Review of Systems  Constitutional: Negative.   HENT: Negative.   Eyes: Negative.   Respiratory: Negative.   Cardiovascular: Negative.   Gastrointestinal: Negative.   Genitourinary: Negative.   Musculoskeletal: Negative.   Skin: Negative.   Neurological: Negative.   Endo/Heme/Allergies: Negative.   Psychiatric/Behavioral: Positive for substance abuse.    Blood pressure 118/72, pulse 83, temperature 98.3 F (36.8 C), temperature source Oral, resp. rate 19, height 5\' 10"  (1.778 m), weight 59 kg, SpO2 95 %.Body mass index is 18.65 kg/m.  General Appearance: Disheveled  Eye Contact:  Good  Speech:  Clear and Coherent and Normal Rate  Volume:  Normal  Mood:  Euthymic  Affect:  Congruent  Thought Process:  Coherent and Descriptions of Associations: Intact  Orientation:  Full (Time, Place, and Person)  Thought Content:  WDL  Suicidal Thoughts:  No  Homicidal Thoughts:  No  Memory:  Immediate;   Good Recent;   Good Remote;   Good  Judgement:  Fair  Insight:  Fair  Psychomotor Activity:  Normal  Concentration:  Concentration: Good and Attention Span: Good  Recall:  Good  Fund of Knowledge:  Good  Language:  Good  Akathisia:  No  Handed:    AIMS (if indicated):     Assets:  Communication Skills Desire  for Improvement Financial Resources/Insurance Housing Resilience Social Support  ADL's:  Intact  Cognition:  WNL  Sleep:        Treatment Plan Summary: Follow up with outpatient provider  Disposition: No evidence of imminent risk to self or others at present.   Patient does not meet criteria for psychiatric inpatient admission. Discussed crisis plan, support from social network, calling 911, coming to the Emergency Department, and calling Suicide Hotline.  [place], FNP 08/25/2018 10:40 AM

## 2018-08-25 NOTE — BH Assessment (Signed)
Writer called and spoke with patient's friend Marguerite Olea [phone]). He is unable to give patient a ride home. Writer spoke with Camera operator Marya Amsler) and he agreed to provide taxi cab voucher.

## 2018-08-29 ENCOUNTER — Other Ambulatory Visit: Payer: Self-pay | Admitting: Physician Assistant

## 2018-08-31 ENCOUNTER — Ambulatory Visit (INDEPENDENT_AMBULATORY_CARE_PROVIDER_SITE_OTHER): Payer: Medicare HMO | Admitting: Licensed Clinical Social Worker

## 2018-08-31 ENCOUNTER — Telehealth: Payer: Self-pay

## 2018-08-31 DIAGNOSIS — F10220 Alcohol dependence with intoxication, uncomplicated: Secondary | ICD-10-CM

## 2018-08-31 NOTE — Telephone Encounter (Signed)
Patient notified. Has not had anything else to drink today. She will be awaiting there call.

## 2018-08-31 NOTE — Progress Notes (Signed)
Patient ID: [name], female   DOB: 1950-10-08, 68 y.o.   MRN: [id number]  Virtual Visit via Telephone Note  I connected with Jennifer [name] on 08/31/18 at 10:00 AM EDT by telephone and verified that I am speaking with the correct person using two identifiers.  I discussed the limitations, risks, security and privacy concerns of performing an evaluation and management service by telephone and the availability of in person appointments. I also discussed with the patient that there may be a patient responsible charge related to this service. The patient expressed understanding and agreed to proceed.  I discussed the assessment and treatment plan with the patient. The patient was provided an opportunity to ask questions and all were answered. The patient agreed with the plan and demonstrated an understanding of the instructions.   The patient was advised to call back or seek an in-person evaluation if the symptoms worsen or if the condition fails to improve as anticipated.  Type of Therapy: Individual Therapy  Treatment Goals addressed:  Refrain from using alcohol as a coping mechanism   Interventions: Reality Therapy, Supportive Counseling  Summary: Jennifer [name] is a 68 y.o. female who presents with symptoms of alcohol relapse, acute intoxication.  Therapist Response:  Patient met with clinician for an individual session. Patient reports she has been drinking for over a week, and rarely stops. She reported to ED on 7/29 while intoxicated, and states that as soon as she left she called ex-boyfriend and they have been together off and on since then. Counselor inquired about patient drinking this morning, as she was tearful and her speech was slurred. Patient stated that she has been drinking this morning, but is not sure how much. She stated that she thinks the way to get better is to go to rehab and leave boyfriend for good. Counselor commended patient's insight regarding rehab, and encouraged her to  follow through. Counselor provided patient with contact information for assessment for inpatient rehabs that will accept Medicare/Medicaid. Patient began to weep about boyfriend, asking why he uses her instead of loving her, and stating that she does not understand. Counselor challenged patient that she is unable to think clearly or understand anything while intoxicated, and emphasized the importance of getting sober. Patient agreed to call friend Darrel to assist her in getting into rehab.She then became angry and was unable to participate further in the session, so counselor ended it early  Suicidal/Homicidal:  No   Recommendation and plan:  Inpatient Substance Abuse Treatment  Diagnosis:  Alcohol Dependence with Intoxication   I provided 32 minutes of non-face-to-face time during this encounter.   Lillie Fragmin, LCSW

## 2018-08-31 NOTE — Telephone Encounter (Signed)
Spoke with patient's therapist. In short, patient had completed alcohol detox at [facility] in the past couple of months. She reported she was discharged on effexor but when she was sent this medication as a refill, she had no idea what it was and is not taking it. She agreed to a psychiatry referral.   Rollene Fare, her therapist, reports that patient attended clinic today very intoxicated. Her therapist gave her supportive information for inpatient alcohol detoxification and is having her clinic call patient back in a few days to address this as patient was not coherent enough to do so today. She should have somebody checking in. You may call her back and tell her of this discussion but she should already have the information and therapy clinic will checking in on her in several days.   If she feels like harming herself then she should go to the ER.

## 2018-08-31 NOTE — Telephone Encounter (Signed)
Patient called very upset. She states that she tried to talked to her psychiatrist because she is having a very hard time because of her boyfriend. She has been drinking significantly this morning. She goes in and out of crying. Keeps bringing up going to a center for detox but she doesn't know where to go. Please advise.

## 2018-09-01 ENCOUNTER — Telehealth: Payer: Self-pay

## 2018-09-01 ENCOUNTER — Telehealth: Payer: Self-pay | Admitting: Physician Assistant

## 2018-09-01 NOTE — Telephone Encounter (Signed)
Pt needing to speak with Adriana.  Pt states her physiatrist is pushing her away, or needing her to find someone else.  Please call pt back asap at [phone].  Thanks, [employer]

## 2018-09-01 NOTE — Telephone Encounter (Signed)
She does not have a psychiatrist, she has a therapist whom I have spoken to and is leaving [facility] this upcoming week. There will be other therapists in that clinic who can accept her or she may be seen in [facility]. Please check on her psychiatrist referral because this was placed on 07/20/2018 and I do not see any appointments scheduled. She needs to see a psychiatrist. She was very intoxicated yesterday and if she is intoxicated on the phone today you may need to call her back when she can understand better. Her therapist regina also informed me that the clinic will be reaching out to patient to discuss resources because she was too intoxicated to be given direction at the last visit.

## 2018-09-01 NOTE — Telephone Encounter (Signed)
Called patient back in regards to message she left at lunch time.

## 2018-09-01 NOTE — Telephone Encounter (Signed)
Patient initially told me she was on this medication after discharge from [facility]. Then after refilling this, she said she had no idea what this medication was. At her most recent visit, I recommended she see psychiatry and she wanted to wait to start additional medication until she saw psychiatry. I am refusing this medication because I do not believe she is taking this.

## 2018-09-02 NOTE — Telephone Encounter (Signed)
Adriana advised. sd

## 2018-09-02 NOTE — Telephone Encounter (Signed)
She had an appointment 08/08/18 at  [facility]

## 2018-10-05 ENCOUNTER — Telehealth: Payer: Self-pay | Admitting: *Deleted

## 2018-10-05 DIAGNOSIS — Z122 Encounter for screening for malignant neoplasm of respiratory organs: Secondary | ICD-10-CM

## 2018-10-05 DIAGNOSIS — Z87891 Personal history of nicotine dependence: Secondary | ICD-10-CM

## 2018-10-05 NOTE — Telephone Encounter (Signed)
Left message for patient to notify them that it is time to schedule annual low dose lung cancer screening CT scan. Instructed patient to call back to verify information prior to the scan being scheduled.  

## 2018-10-05 NOTE — Telephone Encounter (Signed)
Patient has been notified that annual lung cancer screening low dose CT scan is due currently or will be in near future. Confirmed that patient is within the age range of 55-77, and asymptomatic, (no signs or symptoms of lung cancer). Patient denies illness that would prevent curative treatment for lung cancer if found. Verified smoking history, (current, 35.25 pack year). The shared decision making visit was done 03/11/17. Patient is agreeable for CT scan being scheduled.

## 2018-10-12 ENCOUNTER — Ambulatory Visit
Admission: RE | Admit: 2018-10-12 | Discharge: 2018-10-12 | Disposition: A | Discharge status: Home / Self Care | Payer: Medicare HMO | Source: Ambulatory Visit | Attending: Nurse Practitioner | Admitting: Nurse Practitioner

## 2018-10-12 ENCOUNTER — Other Ambulatory Visit: Payer: Self-pay

## 2018-10-12 DIAGNOSIS — F17210 Nicotine dependence, cigarettes, uncomplicated: Secondary | ICD-10-CM | POA: Diagnosis not present

## 2018-10-12 DIAGNOSIS — Z87891 Personal history of nicotine dependence: Secondary | ICD-10-CM

## 2018-10-12 DIAGNOSIS — Z122 Encounter for screening for malignant neoplasm of respiratory organs: Secondary | ICD-10-CM

## 2018-10-14 ENCOUNTER — Encounter: Payer: Self-pay | Admitting: *Deleted

## 2018-11-18 ENCOUNTER — Ambulatory Visit: Payer: Medicare HMO | Admitting: Physician Assistant

## 2018-12-21 ENCOUNTER — Encounter: Payer: Medicare HMO | Admitting: Physician Assistant

## 2018-12-21 ENCOUNTER — Telehealth: Payer: Self-pay | Admitting: Physician Assistant

## 2018-12-21 NOTE — Telephone Encounter (Signed)
The letter has been printed signed and placed in outgoing mail.

## 2018-12-21 NOTE — Progress Notes (Deleted)
{Method of visit:23308}    Patient: Jennifer [name], Female    DOB: 05/17/1950, 68 y.o.   MRN: [id number] Visit Date: 12/21/2018  Today's Provider: Trinna Post, PA-C   No chief complaint on file.  Subjective:     Complete Physical NECHELLE PERRINS is a 68 y.o. female. She feels {DESC; WELL/FAIRLY WELL/POORLY:18703}. She reports exercising ***. She reports she is sleeping {DESC; WELL/FAIRLY WELL/POORLY:18703}.  ----------------------------------------------------------- Last IG:[id number] Last mammogram:  Last colonoscopy:03/29/2017 Last bone density:09/07/2017  Review of Systems  Social History   Socioeconomic History  . Marital status: Single    Spouse name: Not on file  . Number of children: Not on file  . Years of education: Not on file  . Highest education level: Not on file  Occupational History  . Not on file  Social Needs  . Financial resource strain: Not on file  . Food insecurity    Worry: Not on file    Inability: Not on file  . Transportation needs    Medical: Not on file    Non-medical: Not on file  Tobacco Use  . Smoking status: Current Every Day Smoker    Packs/day: 0.75    Years: 46.35    Pack years: 34.76    Types: Cigarettes  . Smokeless tobacco: Never Used  Substance and Sexual Activity  . Alcohol use: Yes    Alcohol/week: 12.0 standard drinks    Types: 12 Shots of liquor per week    Comment: occasional  . Drug use: No  . Sexual activity: Not Currently  Lifestyle  . Physical activity    Days per week: Not on file    Minutes per session: Not on file  . Stress: Not on file  Relationships  . Social Herbalist on phone: Not on file    Gets together: Not on file    Attends religious service: Not on file    Active member of club or organization: Not on file    Attends meetings of clubs or organizations: Not on file    Relationship status: Not on file  . Intimate partner violence    Fear of current or ex partner: Not on file     Emotionally abused: Not on file    Physically abused: Not on file    Forced sexual activity: Not on file  Other Topics Concern  . Not on file  Social History Narrative  . Not on file    Past Medical History:  Diagnosis Date  . Arthritis    mild  . Cancer (HCC)    skin cancer leg and chest  . Dysrhythmia    palpitation  . Family history of colon cancer   . Headache    as  a child   . Heart murmur    while pregnant  . Hyperlipidemia 07/17/2017  . Tobacco abuse      Patient Active Problem List   Diagnosis Date Noted  . Alcohol abuse, episodic drinking behavior 08/25/2018  . Aortic atherosclerosis ([place]) 07/19/2018  . Hypertension 04/07/2018  . Osteoporosis 01/03/2018  . COPD (chronic obstructive pulmonary disease) ([place]) 11/02/2017  . Umbilical hernia s/p primary repair 07/16/2017 07/17/2017  . Hyperlipidemia 07/17/2017  . Arthritis   . Adenomatous polyp of ascending colon s/p right colectomy 07/16/2017 07/16/2017  . Genetic testing 05/13/2017  . History of colonic polyps 05/03/2017  . Family history of colon cancer   . Special screening for malignant neoplasms, colon   .  Tobacco abuse 03/10/2017    Past Surgical History:  Procedure Laterality Date  . ABDOMINAL HYSTERECTOMY     1996  . COLONOSCOPY WITH PROPOFOL N/A 03/29/2017   Procedure: COLONOSCOPY WITH PROPOFOL;  Surgeon: Lin Landsman, MD;  Location[facility] ENDOSCOPY;  Service: Gastroenterology;  Laterality: N/A;  . HEMICOLECTOMY     righ  t6-21-19  . LAPAROSCOPIC RIGHT HEMI COLECTOMY Right 07/16/2017   Procedure: LAPAROSCOPIC RIGHT HEMI COLECTOMY;  Surgeon: Ileana Roup, MD;  Location: [facility] ORS;  Service: General;  Laterality: Right;  . TUBAL LIGATION      Her family history includes Colon cancer in her maternal grandfather and paternal uncle; Colon cancer (age of onset: 14) in her paternal aunt; Diabetes in her maternal grandmother and mother; Heart attack in her father; Heart disease in her paternal  grandmother; Kidney disease in her mother.   Current Outpatient Medications:  .  alendronate (FOSAMAX) 70 MG tablet, TAKE 1 TABLET BY MOUTH ONCE A WEEK ON AN EMPTY STOMACH WITH  A  FULL  GLASS  OF  WATER, Disp: 12 tablet, Rfl: 0 .  atorvastatin (LIPITOR) 10 MG tablet, Take 1 tablet by mouth once daily, Disp: 90 tablet, Rfl: 0 .  hydrOXYzine (ATARAX/VISTARIL) 10 MG tablet, Take 1 tablet (10 mg total) by mouth daily as needed., Disp: 30 tablet, Rfl: 0 .  rOPINIRole (REQUIP) 0.5 MG tablet, Take 1 tablet (0.5 mg total) by mouth at bedtime., Disp: 90 tablet, Rfl: 0 .  varenicline (CHANTIX STARTING MONTH PAK) 0.5 MG X 11 & 1 MG X 42 tablet, 0.5 mg tablet by mouth once daily for 3 days, then 0.5 mg tablet twice daily for 4 days, then increase to one 1 mg tablet twice daily., Disp: 53 tablet, Rfl: 0 .  venlafaxine (EFFEXOR) 37.5 MG tablet, Take 1 tablet (37.5 mg total) by mouth 2 (two) times daily., Disp: 60 tablet, Rfl: 0  Patient Care Team: Paulene Floor as PCP - General (Physician Assistant)     Objective:    Vitals: There were no vitals taken for this visit.  Physical Exam  Activities of Daily Living In your present state of health, do you have any difficulty performing the following activities: 08/24/2018  Hearing? N  Vision? N  Difficulty concentrating or making decisions? N  Walking or climbing stairs? N  Dressing or bathing? N  Some recent data might be hidden    Fall Risk Assessment Fall Risk  11/02/2017 03/09/2017  Falls in the past year? No No     Depression Screen PHQ 2/9 Scores 07/27/2018 07/15/2018 11/02/2017 03/09/2017  PHQ - 2 Score 1 0 0 3  PHQ- 9 Score 4 2 1 6     No flowsheet data found.     Assessment & Plan:    Annual Physical Reviewed patient's Family Medical History Reviewed and updated list of patient's medical providers Assessment of cognitive impairment was done Assessed patient's functional ability Established a written schedule for health  screening [place] Completed and Reviewed  Exercise Activities and Dietary recommendations Goals    . Quit Smoking       Immunization History  Administered Date(s) Administered  . Influenza, High Dose Seasonal PF 11/02/2017  . Pneumococcal Conjugate-13 03/25/2017    Health Maintenance  Topic Date Due  . TETANUS/TDAP  02/06/1971  . MAMMOGRAM  02/05/2002  . PNA vac Low Risk Adult (2 of 2 - PPSV23) 03/25/2018  . INFLUENZA VACCINE  08/27/2018  . COLONOSCOPY  03/30/2027  .  DEXA SCAN  Completed  . Hepatitis C Screening  Completed     Discussed health benefits of physical activity, and encouraged her to engage in regular exercise appropriate for her age and condition.    ------------------------------------------------------------------------------------------------------------    Trinna Post, PA-C  [facility]

## 2018-12-21 NOTE — Telephone Encounter (Signed)
Sending letter to notify of two no-shows. Patient called after hours line at 12:21 AM on 12/21/2018 to cancel her appointment for 10:00 Am on 12/21/2018.

## 2018-12-26 ENCOUNTER — Telehealth: Payer: Self-pay

## 2018-12-26 NOTE — Telephone Encounter (Signed)
This patient is being seen at another office.  I do not think this comes to [place].  Thanks   Copied from [place] [id number]. Topic: Clinical - COVID Pre-Screen >> Dec 26, 2018  1:34 PM Scherrie Gerlach wrote: 1. To the best of your knowledge, have you been in close contact with anyone with a confirmed diagnosis of COVID 19?  no  If no - Proceed to next question; If yes - Schedule patient for a virtual visit  2. Have you had any one or more of the following: fever, chills, cough, shortness of breath or any flu-like symptoms? No   If no - Proceed to next question; If yes - Schedule patient for a virtual visit  3. Have you been diagnosed with or have a previous diagnosis of COVID 19?  no  If no - Proceed to next question; If yes - Schedule patient for a virtual visit  4. I am going to go over a few other symptoms with you. Please let me know if you are experiencing any of the following: no  Ear, nose or throat discomfort  A sore throat  Headache  Muscle pain  Diarrhea  Loss of taste or smell  If no - Continue with scheduling process; If yes - Document in scheduling notes   Thank you for answering these questions. Please know we will ask you these questions or similar questions when you arrive for your appointment and again it's how we are keeping everyone safe. Also, to keep you safe, please use the provided hand sanitizer when you enter the building. Burnadette Peter, we are asking everyone in the building to wear a mask because they help [place] prevent the spread of germs.   Do you have a mask of your own, if not, we are happy to provide one for you. The last thing I want to go over with you is the no visitor guidelines. This means no one can attend the appointment with you unless you need physical assistance. I understand this may be different from your past appointments and I know this may be difficult but please know if someone is driving you we are happy to call them for you once your  appointment is over.

## 2018-12-27 ENCOUNTER — Other Ambulatory Visit: Payer: Self-pay | Admitting: Physician Assistant

## 2018-12-27 ENCOUNTER — Encounter: Payer: Medicare HMO | Admitting: Nurse Practitioner

## 2018-12-27 DIAGNOSIS — E7800 Pure hypercholesterolemia, unspecified: Secondary | ICD-10-CM

## 2018-12-27 NOTE — Telephone Encounter (Signed)
Pt called 8:11 to cancel this same day appt.  Letter from [place] states pt has had 2 no shows and this is a same day cancel.  I did reschedule her appt for 01/25/19.  I do not know if pt has received letter.  If this is another no show, please let pt know and cancel appt. Thank you.

## 2018-12-27 NOTE — Telephone Encounter (Signed)
From [facility] 

## 2018-12-28 NOTE — Telephone Encounter (Signed)
Called patient and left voice message to call the office back.

## 2019-01-02 NOTE — Telephone Encounter (Signed)
Spoke with patient and she states that her 01/25/2019 appointment was suppose to be scheduled with Jennifer [name]. I rescheduled patient appointment with Jennifer [name] on 01/25/2019.

## 2019-01-25 ENCOUNTER — Ambulatory Visit
Admission: RE | Admit: 2019-01-25 | Discharge: 2019-01-25 | Disposition: A | Discharge status: Home / Self Care | Payer: Medicare HMO | Attending: Physician Assistant | Admitting: Physician Assistant

## 2019-01-25 ENCOUNTER — Encounter: Payer: Medicare HMO | Admitting: Nurse Practitioner

## 2019-01-25 ENCOUNTER — Encounter: Payer: Self-pay | Admitting: Physician Assistant

## 2019-01-25 ENCOUNTER — Other Ambulatory Visit: Payer: Self-pay

## 2019-01-25 ENCOUNTER — Ambulatory Visit
Admission: RE | Admit: 2019-01-25 | Discharge: 2019-01-25 | Disposition: A | Discharge status: Home / Self Care | Payer: Medicare HMO | Source: Ambulatory Visit | Attending: Physician Assistant | Admitting: Physician Assistant

## 2019-01-25 ENCOUNTER — Ambulatory Visit (INDEPENDENT_AMBULATORY_CARE_PROVIDER_SITE_OTHER): Payer: Medicare HMO | Admitting: Physician Assistant

## 2019-01-25 VITALS — BP 161/94 | HR 84 | Temp 96.8°F | Wt 134.0 lb

## 2019-01-25 DIAGNOSIS — K635 Polyp of colon: Secondary | ICD-10-CM | POA: Diagnosis not present

## 2019-01-25 DIAGNOSIS — F419 Anxiety disorder, unspecified: Secondary | ICD-10-CM | POA: Diagnosis not present

## 2019-01-25 DIAGNOSIS — Z0000 Encounter for general adult medical examination without abnormal findings: Secondary | ICD-10-CM

## 2019-01-25 DIAGNOSIS — G4700 Insomnia, unspecified: Secondary | ICD-10-CM

## 2019-01-25 DIAGNOSIS — Z23 Encounter for immunization: Secondary | ICD-10-CM | POA: Diagnosis not present

## 2019-01-25 DIAGNOSIS — M25561 Pain in right knee: Secondary | ICD-10-CM

## 2019-01-25 DIAGNOSIS — F32A Depression, unspecified: Secondary | ICD-10-CM

## 2019-01-25 DIAGNOSIS — Z1231 Encounter for screening mammogram for malignant neoplasm of breast: Secondary | ICD-10-CM | POA: Diagnosis not present

## 2019-01-25 DIAGNOSIS — F1010 Alcohol abuse, uncomplicated: Secondary | ICD-10-CM | POA: Diagnosis not present

## 2019-01-25 DIAGNOSIS — E785 Hyperlipidemia, unspecified: Secondary | ICD-10-CM

## 2019-01-25 DIAGNOSIS — F329 Major depressive disorder, single episode, unspecified: Secondary | ICD-10-CM

## 2019-01-25 MED ORDER — TRAZODONE HCL 50 MG PO TABS: 25.0000 mg | ORAL_TABLET | Freq: Every evening | ORAL | 0 refills | Status: DC | PRN

## 2019-01-25 MED ORDER — ESCITALOPRAM OXALATE 10 MG PO TABS: 10.0000 mg | ORAL_TABLET | Freq: Every day | ORAL | 0 refills | Status: DC

## 2019-01-25 NOTE — Patient Instructions (Addendum)
[phone] = [place]
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
## 2019-01-26 LAB — CBC WITH DIFFERENTIAL/PLATELET
Basophils Absolute: 0.1 10*3/uL (ref 0.0–0.2)
Basos: 1 %
EOS (ABSOLUTE): 0.1 10*3/uL (ref 0.0–0.4)
Eos: 1 %
Hematocrit: 41.7 % (ref 34.0–46.6)
Hemoglobin: 14.3 g/dL (ref 11.1–15.9)
Immature Grans (Abs): 0 10*3/uL (ref 0.0–0.1)
Immature Granulocytes: 0 %
Lymphocytes Absolute: 1.7 10*3/uL (ref 0.7–3.1)
Lymphs: 23 %
MCH: 31.5 pg (ref 26.6–33.0)
MCHC: 34.3 g/dL (ref 31.5–35.7)
MCV: 92 fL (ref 79–97)
Monocytes Absolute: 0.7 10*3/uL (ref 0.1–0.9)
Monocytes: 10 %
Neutrophils Absolute: 4.8 10*3/uL (ref 1.4–7.0)
Neutrophils: 65 %
Platelets: 241 10*3/uL (ref 150–450)
RBC: 4.54 x10E6/uL (ref 3.77–5.28)
RDW: 13.3 % (ref 11.7–15.4)
WBC: 7.4 10*3/uL (ref 3.4–10.8)

## 2019-01-26 LAB — LIPID PANEL
Chol/HDL Ratio: 2 ratio (ref 0.0–4.4)
Cholesterol, Total: 228 mg/dL — ABNORMAL HIGH (ref 100–199)
HDL: 113 mg/dL (ref >39)
LDL Chol Calc (NIH): 97 mg/dL (ref 0–99)
Triglycerides: 108 mg/dL (ref 0–149)
VLDL Cholesterol Cal: 18 mg/dL (ref 5–40)

## 2019-01-26 LAB — COMPREHENSIVE METABOLIC PANEL
ALT: 14 IU/L (ref 0–32)
AST: 17 IU/L (ref 0–40)
Albumin/Globulin Ratio: 2 (ref 1.2–2.2)
Albumin: 4.3 g/dL (ref 3.8–4.8)
Alkaline Phosphatase: 87 IU/L (ref 39–117)
BUN/Creatinine Ratio: 12 (ref 12–28)
BUN: 9 mg/dL (ref 8–27)
Bilirubin Total: 0.5 mg/dL (ref 0.0–1.2)
CO2: 21 mmol/L (ref 20–29)
Calcium: 9.5 mg/dL (ref 8.7–10.3)
Chloride: 99 mmol/L (ref 96–106)
Creatinine, Ser: 0.75 mg/dL (ref 0.57–1.00)
GFR calc Af Amer: 96 mL/min/{1.73_m2} (ref >59)
GFR calc non Af Amer: 83 mL/min/{1.73_m2} (ref >59)
Globulin, Total: 2.2 g/dL (ref 1.5–4.5)
Glucose: 85 mg/dL (ref 65–99)
Potassium: 3.7 mmol/L (ref 3.5–5.2)
Sodium: 136 mmol/L (ref 134–144)
Total Protein: 6.5 g/dL (ref 6.0–8.5)

## 2019-01-26 LAB — TSH: TSH: 2.31 u[IU]/mL (ref 0.450–4.500)

## 2019-01-30 ENCOUNTER — Telehealth: Payer: Self-pay

## 2019-01-30 DIAGNOSIS — M25561 Pain in right knee: Secondary | ICD-10-CM

## 2019-01-30 NOTE — Telephone Encounter (Signed)
-----   Message from Trinna Post, [place] sent at 01/30/2019  8:39 AM EST ----- Knee X-ray normal, no fracture

## 2019-01-30 NOTE — Telephone Encounter (Signed)
She can take tylenol and ibuprofen as well as ice it. We can also send in meloxicam 7.5 mg once daily #30 if she needs prescription strength NSAID. She should not take that medication with other NSAIDs.

## 2019-01-30 NOTE — Telephone Encounter (Signed)
Patient was advised and states that her knee is still very painful. Patient would like to know if there is anything she could do to help with the pain. Please advise.

## 2019-01-30 NOTE — Telephone Encounter (Signed)
Patient advised, said she would like to take the Meloxicam 7.5mg .

## 2019-01-31 ENCOUNTER — Other Ambulatory Visit: Payer: Self-pay | Admitting: Physician Assistant

## 2019-01-31 ENCOUNTER — Telehealth: Payer: Self-pay

## 2019-01-31 DIAGNOSIS — M810 Age-related osteoporosis without current pathological fracture: Secondary | ICD-10-CM

## 2019-01-31 MED ORDER — MELOXICAM 7.5 MG PO TABS: 7.5000 mg | ORAL_TABLET | Freq: Every day | ORAL | 0 refills | Status: DC

## 2019-01-31 NOTE — Telephone Encounter (Signed)
Pt advised medication was sent to the pharmacy.

## 2019-01-31 NOTE — Telephone Encounter (Signed)
Copied from [place] [id number]. Topic: General - Other >> Jan 31, 2019  9:30 AM Rainey Pines A wrote: Patient would like a callback from [facility] in regards to pain medication discussed yesterday. Please advise

## 2019-01-31 NOTE — Addendum Note (Signed)
Addended by: Trinna Post on: 01/31/2019 04:27 PM   Modules accepted: Orders

## 2019-02-06 ENCOUNTER — Ambulatory Visit (INDEPENDENT_AMBULATORY_CARE_PROVIDER_SITE_OTHER): Payer: Medicare HMO | Admitting: Licensed Clinical Social Worker

## 2019-02-06 DIAGNOSIS — F10220 Alcohol dependence with intoxication, uncomplicated: Secondary | ICD-10-CM

## 2019-02-06 DIAGNOSIS — F331 Major depressive disorder, recurrent, moderate: Secondary | ICD-10-CM
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
## 2019-02-15 ENCOUNTER — Ambulatory Visit (INDEPENDENT_AMBULATORY_CARE_PROVIDER_SITE_OTHER): Payer: Medicare HMO | Admitting: Licensed Clinical Social Worker

## 2019-02-15 DIAGNOSIS — F331 Major depressive disorder, recurrent, moderate: Secondary | ICD-10-CM

## 2019-02-16 NOTE — Progress Notes (Signed)
Virtual Visit via Video Note  I connected with Jennifer [name] on 02/16/19 at  1:00 PM EST by a video enabled telemedicine application and verified that I am speaking with the correct person using two identifiers.  Location: Patient: Home Provider: Office   I discussed the limitations of evaluation and management by telemedicine and the availability of in person appointments. The patient expressed understanding and agreed to proceed.  THERAPIST PROGRESS NOTE  Session Time: 1:00 pm -1:45 pm  Participation Level: Active  Behavioral Response: CasualAlertDepressed  Type of Therapy: Individual Therapy  Treatment Goals addressed: Coping  Interventions: CBT and Solution Focused  Case Summary: Jennifer [name] is a 69 y.o. female who presents oriented x5 (person, place, situation, time, and object), casually dressed, appropriately groomed, tall height, thin, and cooperative to address mood and alcohol use. Patient has a history of medical treatment including hypertension, and COPD. Patient has a history of mental health treatment including outpatient therapy and detox. Patient denies suicidal and homicidal ideations. Patient denies psychosis including auditory and visual hallucinations. Patient admits to alcohol use at times but denies other substance abuse. Patient is at low risk for lethality at this time.   Session #1   Physically: Patient's sleep is disrupted. Patient noted that her past alcohol use helped her fall asleep in the past and now that she is not drinking regularly, she has difficulty with sleep. Patient admitted to drinking after a negative interaction with her romantic partner. Patient has a set time that she falls asleep at night. She sleeps by midnight. Patient wakes up by 9 am on a good night or sleeps until 11 or Noon when her sleep is disrupted. Patient doesn't like to sleep late. Patient agreed to set her phone across the room so she has to get up to turn it off in the morning.   Spiritually/values: No issues identified.  Relationships: Patient has allowed her ex back into her life. She knows that he is not healthy for her. Patient was able to identify what she liked about him including that he can be kind and he is attractive. Patient noted things that makes her not want to be in a relationship with him is that she was emotionally hurt by him for a year, she feels used by him, he talks down to her, and that he can be hateful. Patient admits that she reads into things and overthinks things. Patient has feelings for him but recognizes that he is not health for her. Patient cares for others and takes care of her mother. She is worried about her mother. Her mother has vertigo and she takes her to appointments.  Emotionally/Mentallly/Behavior: Patient has felt down due to the relationship that she is in. Patient knows that she needs to not have him in her life or at least set boundaries with him.   Patient engaged in session. Patient responded well to interventions. Patient continues to meet criteria for Major depressive disorder, recurrent episode, moderate. Patient will continue in outpatient therapy due to being the least restrictive service to meet her needs. Patient made minimal progress on her goals.   Suicidal/Homicidal: Negativewithout intent/plan  Therapist Response: Therapist reviewed patient's recent thoughts and behaviors. Therapist utilized CBT and Solution Focused Therapy to address mood. Therapist processed patient's feelings to identify triggers for mood. Therapist discussed patient's relationship including what keeps her in it and what makes her want to avoid it.   Plan: Return again in 1 weeks.  Diagnosis: Axis I: Major  depressive disorder, recurrent episode, moderate    Axis II: No diagnosis I discussed the assessment and treatment plan with the patient. The patient was provided an opportunity to ask questions and all were answered. The patient agreed with the  plan and demonstrated an understanding of the instructions.   The patient was advised to call back or seek an in-person evaluation if the symptoms worsen or if the condition fails to improve as anticipated.  I provided 50 minutes of non-face-to-face time during this encounter.   Glori Bickers, LCSW 02/16/2019

## 2019-02-22 ENCOUNTER — Ambulatory Visit (INDEPENDENT_AMBULATORY_CARE_PROVIDER_SITE_OTHER): Payer: Medicare HMO | Admitting: Licensed Clinical Social Worker

## 2019-02-22 DIAGNOSIS — F331 Major depressive disorder, recurrent, moderate: Secondary | ICD-10-CM
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
## 2019-03-01 ENCOUNTER — Other Ambulatory Visit: Payer: Self-pay

## 2019-03-01 ENCOUNTER — Ambulatory Visit (INDEPENDENT_AMBULATORY_CARE_PROVIDER_SITE_OTHER): Payer: Medicare HMO | Admitting: Licensed Clinical Social Worker

## 2019-03-01 DIAGNOSIS — F331 Major depressive disorder, recurrent, moderate: Secondary | ICD-10-CM
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
## 2019-03-09 ENCOUNTER — Other Ambulatory Visit: Payer: Self-pay

## 2019-03-09 ENCOUNTER — Ambulatory Visit: Payer: Medicare HMO | Admitting: Physician Assistant

## 2019-03-09 ENCOUNTER — Ambulatory Visit (INDEPENDENT_AMBULATORY_CARE_PROVIDER_SITE_OTHER): Payer: Medicare HMO | Admitting: Licensed Clinical Social Worker

## 2019-03-09 DIAGNOSIS — F331 Major depressive disorder, recurrent, moderate: Secondary | ICD-10-CM

## 2019-03-09 NOTE — Progress Notes (Signed)
Virtual Visit via Video Note  I connected with Jennifer [name] on 03/09/19 at  4:00 PM EST by a video enabled telemedicine application and verified that I am speaking with the correct person using two identifiers.  Location: Patient: Home Provider: Office   I discussed the limitations of evaluation and management by telemedicine and the availability of in person appointments. The patient expressed understanding and agreed to proceed.  THERAPIST PROGRESS NOTE  Session Time: 4:00 pm -4:45 pm  Participation Level: Active  Behavioral Response: CasualAlertDepressed  Type of Therapy: Individual Therapy  Treatment Goals addressed: Coping  Interventions: CBT and Solution Focused  Case Summary: Jennifer [name] is a 69 y.o. female who presents oriented x5 (person, place, situation, time, and object), casually dressed, appropriately groomed, tall height, thin, and cooperative to address mood and alcohol use. Patient has a history of medical treatment including hypertension, and COPD. Patient has a history of mental health treatment including outpatient therapy and detox. Patient denies suicidal and homicidal ideations. Patient denies psychosis including auditory and visual hallucinations. Patient admits to alcohol use at times but denies other substance abuse. Patient is at low risk for lethality at this time.   Session #4  Physically: Patient was exhausted. She had not slept the night before and she has been drained due to caring for her mother.  Spiritually/values: No issues identified.  Relationships: Patient still interacts with her ex. She has told him off and said that she doesn't want to be around him but continues to interact with him. Patient recognizes that he triggers thoughts, emotions, and behaviors that she wouldn't normally engage in. Patient knows that she needs to stop interacting with him. Patient is caring for her mother. Her mother had a pacemaker put in. She is really worried about  her mother due to her mother not understanding her medications. Patient feels like her mother wants to be waited on hand and foot but doesn't allow patient any time to unwind.  Emotionally/Mentallly/Behavior: Patient's mood is down. She feels overwhelmed and is exhausted. Patient feels "crazy" due to lack of sleep, feeling overwhelmed, and getting upset by how her ex treats her. He didn't acknowledge her the previous night when he went to get a soda from the drink machine and he saw her. She was hurt and was up at night thinking about why he did this to her. Patient noted that she needs time to relax and she needs to sleep as the immediate need. She agreed to do nothing/watch tv for half an hour before she has to go to her mother's later today.   Patient engaged in session. Patient responded well to interventions. Patient continues to meet criteria for Major depressive disorder, recurrent episode, moderate. Patient will continue in outpatient therapy due to being the least restrictive service to meet her needs. Patient made minimal progress on her goals.   Suicidal/Homicidal: Negativewithout intent/plan  Therapist Response: Therapist reviewed patient's recent thoughts and behaviors. Therapist utilized CBT and Solution Focused Therapy to address mood. Therapist processed patient's feelings to identify triggers for mood. Therapist discussed patient's relationships and what she needs now to be ok.  Plan: Return again in 2 weeks. Review treatment plan on or before 04.11.21.   Diagnosis: Axis I: Major depressive disorder, recurrent episode, moderate    Axis II: No diagnosis   I discussed the assessment and treatment plan with the patient. The patient was provided an opportunity to ask questions and all were answered. The patient agreed with the  plan and demonstrated an understanding of the instructions.   The patient was advised to call back or seek an in-person evaluation if the symptoms worsen or if  the condition fails to improve as anticipated.  I provided 45 minutes of non-face-to-face time during this encounter.   Glori Bickers, LCSW 03/09/2019

## 2019-03-15 ENCOUNTER — Ambulatory Visit: Payer: Medicare HMO | Admitting: Physician Assistant

## 2019-03-15 NOTE — Progress Notes (Deleted)
       Patient: Jennifer [name] Female    DOB: 11/21/1950   69 y.o.   MRN: [id number] Visit Date: 03/15/2019  Today's Provider: Trinna Post, PA-C   No chief complaint on file.  Subjective:     HPI  No Known Allergies   Current Outpatient Medications:  .  alendronate (FOSAMAX) 70 MG tablet, TAKE 1 TABLET BY MOUTH ONCE A WEEK. TAKE ON AN EMPTY STOMACH WITH A FULL GLASS OF WATER, Disp: 12 tablet, Rfl: 0 .  atorvastatin (LIPITOR) 10 MG tablet, Take 1 tablet by mouth once daily, Disp: 90 tablet, Rfl: 0 .  escitalopram (LEXAPRO) 10 MG tablet, Take 1 tablet (10 mg total) by mouth daily., Disp: 90 tablet, Rfl: 0 .  meloxicam (MOBIC) 7.5 MG tablet, Take 1 tablet (7.5 mg total) by mouth daily., Disp: 30 tablet, Rfl: 0 .  rOPINIRole (REQUIP) 0.5 MG tablet, Take 1 tablet (0.5 mg total) by mouth at bedtime., Disp: 90 tablet, Rfl: 0 .  traZODone (DESYREL) 50 MG tablet, Take 0.5-1 tablets (25-50 mg total) by mouth at bedtime as needed for sleep., Disp: 30 tablet, Rfl: 0  Review of Systems  Social History   Tobacco Use  . Smoking status: Current Every Day Smoker    Packs/day: 0.75    Years: 46.35    Pack years: 34.76    Types: Cigarettes  . Smokeless tobacco: Never Used  Substance Use Topics  . Alcohol use: Yes    Alcohol/week: 12.0 standard drinks    Types: 12 Shots of liquor per week    Comment: occasional      Objective:   There were no vitals taken for this visit. There were no vitals filed for this visit.There is no height or weight on file to calculate BMI.   Physical Exam   No results found for any visits on 03/15/19.     Assessment & [place], PA-C  [facility]

## 2019-04-06 ENCOUNTER — Ambulatory Visit (INDEPENDENT_AMBULATORY_CARE_PROVIDER_SITE_OTHER): Payer: Medicare HMO | Admitting: Licensed Clinical Social Worker

## 2019-04-06 DIAGNOSIS — F331 Major depressive disorder, recurrent, moderate: Secondary | ICD-10-CM

## 2019-04-06 NOTE — Progress Notes (Signed)
Virtual Visit via Video Note  I connected with Jennifer [name] on 04/06/19 at  8:00 AM EST by a video enabled telemedicine application and verified that I am speaking with the correct person using two identifiers.  Location: Patient: Home Provider: Office   I discussed the limitations of evaluation and management by telemedicine and the availability of in person appointments. The patient expressed understanding and agreed to proceed.  THERAPIST PROGRESS NOTE  Session Time: 8:00 am -8:45 am  Participation Level: Active  Behavioral Response: CasualAlertDepressed  Type of Therapy: Individual Therapy  Treatment Goals addressed: Coping  Interventions: CBT and Solution Focused  Case Summary: Jennifer [name] is a 69 y.o. female who presents oriented x5 (person, place, situation, time, and object), casually dressed, appropriately groomed, tall height, thin, and cooperative to address mood and alcohol use. Patient has a history of medical treatment including hypertension, and COPD. Patient has a history of mental health treatment including outpatient therapy and detox. Patient denies suicidal and homicidal ideations. Patient denies psychosis including auditory and visual hallucinations. Patient admits to alcohol use at times but denies other substance abuse. Patient is at low risk for lethality at this time.   Session #5  Physically: Patient is eating more. She is trying to gain weight. Patient is not drinking. She has been maintaining sobriety. She has not drank in over a month. Patient is getting 10 hours of sleep. She feels like she sleeps too much. Patient wants to start walking.  Spiritually/values: No issues identified.  Relationships: Patient is not talking to her ex. She has not spoke to him in a week. She feels like the relationship is done. Patient is taking care of her mother. Her mother has been in the hospital several times and had a pace maker put in.  Emotionally/Mentallly/Behavior:  Patient's mood is good. Patient is maintaining sobriety. She is using self talk and prayer. She wants to stop thinking about her ex. Patient recognizes that he is not a healthy person. After discussion, patient understood that the same steps she is taking to maintain sobriety she can use to disrupt her thoughts of her ex. She also understood that time and distance/space will help her move forward.   Patient engaged in session. Patient responded well to interventions. Patient continues to meet criteria for Major depressive disorder, recurrent episode, moderate. Patient will continue in outpatient therapy due to being the least restrictive service to meet her needs. Patient made minimal progress on her goals.   Suicidal/Homicidal: Negativewithout intent/plan  Therapist Response: Therapist reviewed patient's recent thoughts and behaviors. Therapist utilized CBT and Solution Focused Therapy to address mood. Therapist processed patient's feelings to identify triggers for mood. Therapist discussed patient's sobriety and her relationship with her ex.   Plan: Return again in 2 weeks. Review treatment plan on or before 04.11.21.   Diagnosis: Axis I: Major depressive disorder, recurrent episode, moderate    Axis II: No diagnosis   I discussed the assessment and treatment plan with the patient. The patient was provided an opportunity to ask questions and all were answered. The patient agreed with the plan and demonstrated an understanding of the instructions.   The patient was advised to call back or seek an in-person evaluation if the symptoms worsen or if the condition fails to improve as anticipated.  I provided 45 minutes of non-face-to-face time during this encounter.   Glori Bickers, LCSW 04/06/2019

## 2019-04-25 ENCOUNTER — Ambulatory Visit (INDEPENDENT_AMBULATORY_CARE_PROVIDER_SITE_OTHER): Payer: Medicare HMO | Admitting: Licensed Clinical Social Worker

## 2019-04-25 DIAGNOSIS — F331 Major depressive disorder, recurrent, moderate: Secondary | ICD-10-CM

## 2019-04-25 NOTE — Progress Notes (Signed)
Virtual Visit via Video Note  I connected with Jennifer [name] on 04/25/19 at  8:00 AM EDT by a video enabled telemedicine application and verified that I am speaking with the correct person using two identifiers.  Location: Patient: Home Provider: Office   I discussed the limitations of evaluation and management by telemedicine and the availability of in person appointments. The patient expressed understanding and agreed to proceed.  THERAPIST PROGRESS NOTE  Session Time: 8:00 am -8:45 am  Participation Level: Active  Behavioral Response: CasualAlertDepressed  Type of Therapy: Individual Therapy  Treatment Goals addressed: Coping  Interventions: CBT and Solution Focused  Case Summary: Jennifer [name] is a 69 y.o. female who presents oriented x5 (person, place, situation, time, and object), casually dressed, appropriately groomed, tall height, thin, and cooperative to address mood and alcohol use. Patient has a history of medical treatment including hypertension, and COPD. Patient has a history of mental health treatment including outpatient therapy and detox. Patient denies suicidal and homicidal ideations. Patient denies psychosis including auditory and visual hallucinations. Patient admits to alcohol use at times but denies other substance abuse. Patient is at low risk for lethality at this time.   Session #6  Physically: Patient has lost her appetite again. She denies other physical issues. Patient has not gone out and walked yet. Patient is planning on working on improving her eating daily. Patient denies alcohol use.  Spiritually/values: No issues identified.  Relationships: Patient's ex has moved out and didn't tell her. She was hurt about this. Patient is feeling down due to this. After discussion, patient understood that she is grieving the loss of this relationship. Emotionally/Mentallly/Behavior: Patient's mood is down. Patient identified that she needs to stop worrying about  others. She noted she needs to focus on self and focus on living. Patient said that living for her would including saying "no." Patient understood that saying no doesn't mean she is a bad person, or that others will get upset with her.   Patient engaged in session. Patient responded well to interventions. Patient continues to meet criteria for Major depressive disorder, recurrent episode, moderate. Patient will continue in outpatient therapy due to being the least restrictive service to meet her needs. Patient made minimal progress on her goals.   Suicidal/Homicidal: Negativewithout intent/plan  Therapist Response: Therapist reviewed patient's recent thoughts and behaviors. Therapist utilized CBT and Solution Focused Therapy to address mood. Therapist processed patient's feelings to identify triggers for mood. Therapist discussed patient's sobriety and her relationship with her ex.   Plan: Return again in 2 weeks. Review treatment plan on or before 04.11.21.   Diagnosis: Axis I: Major depressive disorder, recurrent episode, moderate    Axis II: No diagnosis   I discussed the assessment and treatment plan with the patient. The patient was provided an opportunity to ask questions and all were answered. The patient agreed with the plan and demonstrated an understanding of the instructions.   The patient was advised to call back or seek an in-person evaluation if the symptoms worsen or if the condition fails to improve as anticipated.  I provided 45 minutes of non-face-to-face time during this encounter.   Glori Bickers, LCSW 04/25/2019

## 2019-05-09 ENCOUNTER — Ambulatory Visit (INDEPENDENT_AMBULATORY_CARE_PROVIDER_SITE_OTHER): Payer: Medicare HMO | Admitting: Licensed Clinical Social Worker

## 2019-05-09 DIAGNOSIS — F331 Major depressive disorder, recurrent, moderate: Secondary | ICD-10-CM

## 2019-05-09 NOTE — Progress Notes (Signed)
Virtual Visit via Video Note  I connected with Jennifer [name] on 05/09/19 at  8:00 AM EDT by a video enabled telemedicine application and verified that I am speaking with the correct person using two identifiers.  Location: Patient: Home Provider: Office   I discussed the limitations of evaluation and management by telemedicine and the availability of in person appointments. The patient expressed understanding and agreed to proceed.  THERAPIST PROGRESS NOTE  Session Time: 8:00 am -8:50 am  Participation Level: Active  Behavioral Response: CasualAlertDepressed  Type of Therapy: Individual Therapy  Treatment Goals addressed: Coping  Interventions: CBT and Solution Focused  Case Summary: Jennifer [name] is a 69 y.o. female who presents oriented x5 (person, place, situation, time, and object), casually dressed, appropriately groomed, tall height, thin, and cooperative to address mood and alcohol use. Patient has a history of medical treatment including hypertension, and COPD. Patient has a history of mental health treatment including outpatient therapy and detox. Patient denies suicidal and homicidal ideations. Patient denies psychosis including auditory and visual hallucinations. Patient admits to alcohol use at times but denies other substance abuse. Patient is at low risk for lethality at this time.   Session #7  Physically: Patient was doing well physically. Her energy has been low.   Spiritually/values: No issues identified.  Relationships: Patient spoke to her ex and he said he was going to come over. He did not and he has not spoke to her since. Patient spend time with her best friend over the last week. She had a lot of fun and feels like her old self.  Emotionally/Mentallly/Behavior: Patient's mood has been stable. Patient has a good relationship with her friend. Patient has a difficult time with her mother. She feels like she has never had a typical mother/daughter relationship with  her but feels like she needs to have one with her. Patient feels like she is not able to be as "free" as she was in the past. She is unable to identify what "happy" means to her. Patient agreed to identify this. She feels like she has made progress on her goals but feels like she needs to feel more happy.   Patient engaged in session. Patient responded well to interventions. Patient continues to meet criteria for Major depressive disorder, recurrent episode, moderate. Patient will continue in outpatient therapy due to being the least restrictive service to meet her needs. Patient made moderate progress on her goals.   Suicidal/Homicidal: Negativewithout intent/plan  Therapist Response: Therapist reviewed patient's recent thoughts and behaviors. Therapist utilized CBT and Solution Focused Therapy to address mood. Therapist processed patient's feelings to identify triggers for mood. Therapist updated patient's treatment plan.    Plan: Return again in 2 weeks. Review treatment plan on or before 07.11.21.   Diagnosis: Axis I: Major depressive disorder, recurrent episode, moderate    Axis II: No diagnosis   I discussed the assessment and treatment plan with the patient. The patient was provided an opportunity to ask questions and all were answered. The patient agreed with the plan and demonstrated an understanding of the instructions.   The patient was advised to call back or seek an in-person evaluation if the symptoms worsen or if the condition fails to improve as anticipated.  I provided 45 minutes of non-face-to-face time during this encounter.   Glori Bickers, LCSW 05/09/2019

## 2019-05-12 NOTE — Progress Notes (Signed)
Established patient visit     I,Bretlyn Ward,acting as a scribe for Trinna Post, PA-C.,have documented all relevant documentation on the behalf of Trinna Post, PA-C,as directed by  Trinna Post, PA-C while in the presence of Trinna Post, PA-C.   Patient: Jennifer [name]   DOB: 1950-02-29   69 y.o. Female  MRN: [id number] Visit Date: 05/15/2019  Today's healthcare provider: Trinna Post, PA-C  Subjective:    Chief Complaint  Patient presents with  . Foot Problem    swelling  . Chest Pain   Chest Pain  This is a new problem. The current episode started in the past 7 days. The onset quality is gradual. The problem occurs rarely. The problem has been gradually improving. The pain is present in the substernal region. The quality of the pain is described as sharp. The pain does not radiate. Associated symptoms include irregular heartbeat and malaise/fatigue. Pertinent negatives include no abdominal pain or back pain.  Insomnia Primary symptoms: difficulty falling asleep, malaise/fatigue.  The current episode started more than one month. The onset quality is undetermined. The problem occurs nightly. The problem is unchanged. The symptoms are aggravated by alcohol, anxiety, emotional upset, tobacco and SSRI use. Past treatments include medication (50-100 mg trazodone at night for sleep.). The treatment provided no relief. Prior diagnostic workup includes:  Blood work.  Alcohol Problem The patient's primary symptoms include intoxication. This is a chronic problem. The current episode started more than 1 year ago. The problem has been gradually improving (last had a drink 1 month ago. ) since onset. Suspected agents include alcohol. Past treatments include inpatient detox and Alcoholics Anonymous. The treatment provided mild relief.    Leg Swelling Patient presents today for lower leg swelling/foot swelling. She stated that it was swollen the past 2 days but has greatly  improved today. She denies eating salty foods, but was on her feet a lot all weekend. She states that her left foot on the outer part is a little sore today.  Smoking cessation Patient reports smoking a pack a day. She is interested in quitting. Chantix has worked well for her in the past.   Depression, Follow-up  She  was last seen for this 4 months ago. Changes made at last visit include starting lexapro 10mg  daily.   She reports good compliance with treatment. She is not having side effects.   She reports good tolerance of treatment. Current symptoms include: anhedonia, depressed mood, fatigue and insomnia She feels she is Unchanged since last visit.  Depression screen [facility] 2/9 05/15/2019 01/25/2019 07/27/2018  Decreased Interest 0 0 0  Down, Depressed, Hopeless 1 2 1   PHQ - 2 Score 1 2 1   Altered sleeping 1 3 1   Tired, decreased energy 1 0 0  Change in appetite 1 2 1   Feeling bad or failure about yourself  0 0 1  Trouble concentrating 0 0 0  Moving slowly or fidgety/restless 0 0 -  Suicidal thoughts 0 0 0  PHQ-9 Score 4 7 4   Difficult doing work/chores Somewhat difficult Not difficult at all Somewhat difficult     -----------------------------------------------------------------------------------------        Medications: Outpatient Medications Prior to Visit  Medication Sig  . alendronate (FOSAMAX) 70 MG tablet TAKE 1 TABLET BY MOUTH ONCE A WEEK. TAKE ON AN EMPTY STOMACH WITH A FULL GLASS OF WATER  . [DISCONTINUED] atorvastatin (LIPITOR) 10 MG tablet Take 1 tablet by mouth once daily  . [  DISCONTINUED] escitalopram (LEXAPRO) 10 MG tablet Take 1 tablet (10 mg total) by mouth daily.  . meloxicam (MOBIC) 7.5 MG tablet Take 1 tablet (7.5 mg total) by mouth daily. (Patient not taking: Reported on 05/15/2019)  . traZODone (DESYREL) 50 MG tablet Take 0.5-1 tablets (25-50 mg total) by mouth at bedtime as needed for sleep. (Patient not taking: Reported on 05/15/2019)  .  [DISCONTINUED] rOPINIRole (REQUIP) 0.5 MG tablet Take 1 tablet (0.5 mg total) by mouth at bedtime.   No facility-administered medications prior to visit.    Review of Systems  Constitutional: Positive for malaise/fatigue.  HENT: Negative.   Eyes: Negative.   Respiratory: Negative.   Cardiovascular: Positive for chest pain and leg swelling.       Foot swelling  Gastrointestinal: Negative for abdominal pain.  Endocrine: Negative.   Genitourinary: Negative.   Musculoskeletal: Negative for back pain and joint swelling.  Skin: Negative.   Allergic/Immunologic: Negative.   Neurological: Negative.   Hematological: Negative.   Psychiatric/Behavioral: The patient has insomnia.      Office Visit from 05/15/2019 in [facility]  PHQ-9 Total Score  4      Last metabolic panel Lab Results  Component Value Date   GLUCOSE 85 01/25/2019   NA 136 01/25/2019   K 3.7 01/25/2019   CL 99 01/25/2019   CO2 21 01/25/2019   BUN 9 01/25/2019   CREATININE 0.75 01/25/2019   GFRNONAA 83 01/25/2019   GFRAA 96 01/25/2019   CALCIUM 9.5 01/25/2019   PHOS 2.3 (L) 07/18/2017   PROT 6.5 01/25/2019   ALBUMIN 4.3 01/25/2019   LABGLOB 2.2 01/25/2019   AGRATIO 2.0 01/25/2019   BILITOT 0.5 01/25/2019   ALKPHOS 87 01/25/2019   AST 17 01/25/2019   ALT 14 01/25/2019   ANIONGAP 12 08/24/2018   Last thyroid functions Lab Results  Component Value Date   TSH 2.310 01/25/2019        Objective:    BP 138/64   Pulse 67   Temp (!) 97.1 F (36.2 C) (Temporal)   Resp 16   Ht 5\' 10"  (1.778 m)   Wt 134 lb (60.8 kg)   BMI 19.23 kg/m    Physical Exam Constitutional:      Appearance: Normal appearance.  HENT:     Head: Normocephalic and atraumatic.  Cardiovascular:     Rate and Rhythm: Normal rate and regular rhythm.     Heart sounds: Normal heart sounds.  Pulmonary:     Effort: Pulmonary effort is normal.     Breath sounds: Normal breath sounds.  Musculoskeletal:     Right  ankle: No swelling.     Left ankle: No swelling.  Skin:    General: Skin is warm and dry.  Neurological:     Mental Status: She is alert and oriented to person, place, and time.  Psychiatric:        Mood and Affect: Mood is depressed.        Behavior: Behavior normal.     No results found for any visits on 05/15/19.    Assessment & Plan:    1. Chest pain, unspecified type EKG is normal and is comparable to her last ekg obtained. No ankle swelling today. - EKG 12-Lead  2. Hypercholesterolemia Previously well controlled Continue statin Goal LDL < 100  - atorvastatin (LIPITOR) 10 MG tablet; Take 1 tablet (10 mg total) by mouth daily.  Dispense: 90 tablet; Refill: 1  3. Anxiety and depression Increased her  Lexapro 10mg  to 20 mg daily. She is continuing her therapy. - escitalopram (LEXAPRO) 20 MG tablet; Take 1 tablet (20 mg total) by mouth daily.  Dispense: 90 tablet; Refill: 1 Follow up in 1 month  For medication change  4. Tobacco abuse Discussed with her the instructions for  Chantix. Pick start date and 2 weeks prior start taking the medication. I have spent greater than 3 minutes counseling on tobacco cessation.   - varenicline (CHANTIX STARTING MONTH PAK) 0.5 MG X 11 & 1 MG X 42 tablet; 0.5 mg tablet by mouth once daily for 3 days, then 0.5 mg tablet twice daily for 4 days, then increase to one 1 mg tablet twice daily.  Dispense: 53 tablet; Refill: 0  5. Sleep disorder Increased her trazodone to 50mg  at night for sleep. Discussed her fatigue and advised that her sleep issues may be contributing to her fatigue. Also advised her to start taking OTC Vitamin D supplement.    6. Alcoholism ([place]) Currently in remission. Last drink 1 month ago.  7. Polyp of colon, unspecified part of colon, unspecified type Reminded to schedule colonoscopy. She is status post right hemicolectomy secondary to large polyp.     Return in about 1 month (around 06/14/2019) for Depression/  Medication changes.     ITrinna Post, PA-C, have reviewed all documentation for this visit. The documentation on 05/15/19 for the exam, diagnosis, procedures, and orders are all accurate and complete.    Paulene Floor  [facility] [phone] (phone) [phone] (fax)  [place]

## 2019-05-15 ENCOUNTER — Other Ambulatory Visit: Payer: Self-pay

## 2019-05-15 ENCOUNTER — Ambulatory Visit (INDEPENDENT_AMBULATORY_CARE_PROVIDER_SITE_OTHER): Payer: Medicare HMO | Admitting: Physician Assistant

## 2019-05-15 ENCOUNTER — Encounter: Payer: Self-pay | Admitting: Physician Assistant

## 2019-05-15 VITALS — BP 138/64 | HR 67 | Temp 97.1°F | Resp 16 | Ht 70.0 in | Wt 134.0 lb

## 2019-05-15 DIAGNOSIS — Z720 Tobacco use: Secondary | ICD-10-CM | POA: Diagnosis not present

## 2019-05-15 DIAGNOSIS — F1020 Alcohol dependence, uncomplicated: Secondary | ICD-10-CM | POA: Diagnosis not present

## 2019-05-15 DIAGNOSIS — Z23 Encounter for immunization: Secondary | ICD-10-CM

## 2019-05-15 DIAGNOSIS — J439 Emphysema, unspecified: Secondary | ICD-10-CM

## 2019-05-15 DIAGNOSIS — E7800 Pure hypercholesterolemia, unspecified: Secondary | ICD-10-CM | POA: Diagnosis not present

## 2019-05-15 DIAGNOSIS — I700 Atherosclerosis of aorta: Secondary | ICD-10-CM | POA: Diagnosis not present

## 2019-05-15 DIAGNOSIS — R079 Chest pain, unspecified: Secondary | ICD-10-CM

## 2019-05-15 DIAGNOSIS — G479 Sleep disorder, unspecified: Secondary | ICD-10-CM | POA: Diagnosis not present

## 2019-05-15 DIAGNOSIS — F1021 Alcohol dependence, in remission: Secondary | ICD-10-CM | POA: Diagnosis not present

## 2019-05-15 DIAGNOSIS — F32A Depression, unspecified: Secondary | ICD-10-CM

## 2019-05-15 DIAGNOSIS — K635 Polyp of colon: Secondary | ICD-10-CM | POA: Diagnosis not present

## 2019-05-15 DIAGNOSIS — F419 Anxiety disorder, unspecified: Secondary | ICD-10-CM | POA: Diagnosis not present

## 2019-05-15 DIAGNOSIS — F329 Major depressive disorder, single episode, unspecified: Secondary | ICD-10-CM

## 2019-05-15 MED ORDER — CHANTIX STARTING MONTH PAK 0.5 MG X 11 & 1 MG X 42 PO TABS: ORAL_TABLET | ORAL | 0 refills | Status: DC

## 2019-05-15 MED ORDER — ESCITALOPRAM OXALATE 20 MG PO TABS: 20.0000 mg | ORAL_TABLET | Freq: Every day | ORAL | 1 refills | Status: DC

## 2019-05-15 MED ORDER — ESCITALOPRAM OXALATE 10 MG PO TABS: 10.0000 mg | ORAL_TABLET | Freq: Every day | ORAL | 1 refills | Status: DC

## 2019-05-15 MED ORDER — ATORVASTATIN CALCIUM 10 MG PO TABS: 10.0000 mg | ORAL_TABLET | Freq: Every day | ORAL | 1 refills | Status: DC

## 2019-05-15 NOTE — Patient Instructions (Signed)
Steps to Quit Smoking Smoking tobacco is the leading cause of preventable death. It can affect almost every organ in the body. Smoking puts you and people around you at risk for many serious, long-lasting (chronic) diseases. Quitting smoking can be hard, but it is one of the best things that you can do for your health. It is never too late to quit. How do I get ready to quit? When you decide to quit smoking, make a plan to help you succeed. Before you quit:  Pick a date to quit. Set a date within the next 2 weeks to give you time to prepare.  Write down the reasons why you are quitting. Keep this list in places where you will see it often.  Tell your family, friends, and co-workers that you are quitting. Their support is important.  Talk with your doctor about the choices that may help you quit.  Find out if your health insurance will pay for these treatments.  Know the people, places, things, and activities that make you want to smoke (triggers). Avoid them. What first steps can I take to quit smoking?  Throw away all cigarettes at home, at work, and in your car.  Throw away the things that you use when you smoke, such as ashtrays and lighters.  Clean your car. Make sure to empty the ashtray.  Clean your home, including curtains and carpets. What can I do to help me quit smoking? Talk with your doctor about taking medicines and seeing a counselor at the same time. You are more likely to succeed when you do both.  If you are pregnant or breastfeeding, talk with your doctor about counseling or other ways to quit smoking. Do not take medicine to help you quit smoking unless your doctor tells you to do so. To quit smoking: Quit right away  Quit smoking totally, instead of slowly cutting back on how much you smoke over a period of time.  Go to counseling. You are more likely to quit if you go to counseling sessions regularly. Take medicine You may take medicines to help you quit. Some  medicines need a prescription, and some you can buy over-the-counter. Some medicines may contain a drug called nicotine to replace the nicotine in cigarettes. Medicines may:  Help you to stop having the desire to smoke (cravings).  Help to stop the problems that come when you stop smoking (withdrawal symptoms). Your doctor may ask you to use:  Nicotine patches, gum, or lozenges.  Nicotine inhalers or sprays.  Non-nicotine medicine that is taken by mouth. Find resources Find resources and other ways to help you quit smoking and remain smoke-free after you quit. These resources are most helpful when you use them often. They include:  Online chats with a counselor.  Phone quitlines.  Printed self-help materials.  Support groups or group counseling.  Text messaging programs.  Mobile phone apps. Use apps on your mobile phone or tablet that can help you stick to your quit plan. There are many free apps for mobile phones and tablets as well as websites. Examples include Quit Guide from the CDC and smokefree.gov  What things can I do to make it easier to quit?   Talk to your family and friends. Ask them to support and encourage you.  Call a phone quitline (1-800-QUIT-NOW), reach out to support groups, or work with a counselor.  Ask people who smoke to not smoke around you.  Avoid places that make you want to smoke,   such as: ? Bars. ? Parties. ? Smoke-break areas at work.  Spend time with people who do not smoke.  Lower the stress in your life. Stress can make you want to smoke. Try these things to help your stress: ? Getting regular exercise. ? Doing deep-breathing exercises. ? Doing yoga. ? Meditating. ? Doing a body scan. To do this, close your eyes, focus on one area of your body at a time from head to toe. Notice which parts of your body are tense. Try to relax the muscles in those areas. How will I feel when I quit smoking? Day 1 to 3 weeks Within the first 24 hours,  you may start to have some problems that come from quitting tobacco. These problems are very bad 2-3 days after you quit, but they do not often last for more than 2-3 weeks. You may get these symptoms:  Mood swings.  Feeling restless, nervous, angry, or annoyed.  Trouble concentrating.  Dizziness.  Strong desire for high-sugar foods and nicotine.  Weight gain.  Trouble pooping (constipation).  Feeling like you may vomit (nausea).  Coughing or a sore throat.  Changes in how the medicines that you take for other issues work in your body.  Depression.  Trouble sleeping (insomnia). Week 3 and afterward After the first 2-3 weeks of quitting, you may start to notice more positive results, such as:  Better sense of smell and taste.  Less coughing and sore throat.  Slower heart rate.  Lower blood pressure.  Clearer skin.  Better breathing.  Fewer sick days. Quitting smoking can be hard. Do not give up if you fail the first time. Some people need to try a few times before they succeed. Do your best to stick to your quit plan, and talk with your doctor if you have any questions or concerns. Summary  Smoking tobacco is the leading cause of preventable death. Quitting smoking can be hard, but it is one of the best things that you can do for your health.  When you decide to quit smoking, make a plan to help you succeed.  Quit smoking right away, not slowly over a period of time.  When you start quitting, seek help from your doctor, family, or friends. This information is not intended to replace advice given to you by your health care provider. Make sure you discuss any questions you have with your health care provider. Document Revised: 10/07/2018 Document Reviewed: 04/02/2018 Elsevier Patient Education  [place]. Varenicline oral tablets What is this medicine? VARENICLINE (var e NI kleen) is used to help people quit smoking. It is used with a patient support  program recommended by your physician. This medicine may be used for other purposes; ask your health care provider or pharmacist if you have questions. COMMON BRAND NAME(S): Chantix What should I tell my health care provider before I take this medicine? They need to know if you have any of these conditions:  heart disease  if you often drink alcohol  kidney disease  mental illness  on hemodialysis  seizures  history of stroke  suicidal thoughts, plans, or attempt; a previous suicide attempt by you or a family member  an unusual or allergic reaction to varenicline, other medicines, foods, dyes, or preservatives  pregnant or trying to get pregnant  breast-feeding How should I use this medicine? Take this medicine by mouth after eating. Take with a full glass of water. Follow the directions on the prescription label. Take your doses  at regular intervals. Do not take your medicine more often than directed. There are 3 ways you can use this medicine to help you quit smoking; talk to your health care professional to decide which plan is right for you: 1) you can choose a quit date and start this medicine 1 week before the quit date, or, 2) you can start taking this medicine before you choose a quit date, and then pick a quit date between day 8 and 35 days of treatment, or, 3) if you are not sure that you are able or willing to quit smoking right away, start taking this medicine and slowly decrease the amount you smoke as directed by your health care professional with the goal of being cigarette-free by week 12 of treatment. Stick to your plan; ask about support groups or other ways to help you remain cigarette-free. If you are motivated to quit smoking and did not succeed during a previous attempt with this medicine for reasons other than side effects, or if you returned to smoking after this treatment, speak with your health care professional about whether another course of this medicine  may be right for you. A special MedGuide will be given to you by the pharmacist with each prescription and refill. Be sure to read this information carefully each time. Talk to your pediatrician regarding the use of this medicine in children. This medicine is not approved for use in children. Overdosage: If you think you have taken too much of this medicine contact a poison control center or emergency room at once. NOTE: This medicine is only for you. Do not share this medicine with others. What if I miss a dose? If you miss a dose, take it as soon as you can. If it is almost time for your next dose, take only that dose. Do not take double or extra doses. What may interact with this medicine?  alcohol  insulin  other medicines used to help people quit smoking  theophylline  warfarin This list may not describe all possible interactions. Give your health care provider a list of all the medicines, herbs, non-prescription drugs, or dietary supplements you use. Also tell them if you smoke, drink alcohol, or use illegal drugs. Some items may interact with your medicine. What should I watch for while using this medicine? It is okay if you do not succeed at your attempt to quit and have a cigarette. You can still continue your quit attempt and keep using this medicine as directed. Just throw away your cigarettes and get back to your quit plan. Talk to your health care provider before using other treatments to quit smoking. Using this medicine with other treatments to quit smoking may increase the risk for side effects compared to using a treatment alone. You may get drowsy or dizzy. Do not drive, use machinery, or do anything that needs mental alertness until you know how this medicine affects you. Do not stand or sit up quickly, especially if you are an older patient. This reduces the risk of dizzy or fainting spells. Decrease the number of alcoholic beverages that you drink during treatment with  this medicine until you know if this medicine affects your ability to tolerate alcohol. Some people have experienced increased drunkenness (intoxication), unusual or sometimes aggressive behavior, or no memory of things that have happened (amnesia) during treatment with this medicine. Sleepwalking can happen during treatment with this medicine, and can sometimes lead to behavior that is harmful to you, other people, or  property. Stop taking this medicine and tell your doctor if you start sleepwalking or have other unusual sleep-related activity. After taking this medicine, you may get up out of bed and do an activity that you do not know you are doing. The next morning, you may have no memory of this. Activities include driving a car ("sleep-driving"), making and eating food, talking on the phone, sexual activity, and sleep-walking. Serious injuries have occurred. Stop the medicine and call your doctor right away if you find out you have done any of these activities. Do not take this medicine if you have used alcohol that evening. Do not take it if you have taken another medicine for sleep. The risk of doing these sleep-related activities is higher. Patients and their families should watch out for new or worsening depression or thoughts of suicide. Also watch out for sudden changes in feelings such as feeling anxious, agitated, panicky, irritable, hostile, aggressive, impulsive, severely restless, overly excited and hyperactive, or not being able to sleep. If this happens, call your health care professional. If you have diabetes and you quit smoking, the effects of insulin may be increased and you may need to reduce your insulin dose. Check with your doctor or health care professional about how you should adjust your insulin dose. What side effects may I notice from receiving this medicine? Side effects that you should report to your doctor or health care professional as soon as possible:  allergic reactions  like skin rash, itching or hives, swelling of the face, lips, tongue, or throat  acting aggressive, being angry or violent, or acting on dangerous impulses  breathing problems  changes in emotions or moods  chest pain or chest tightness  feeling faint or lightheaded, falls  hallucination, loss of contact with reality  mouth sores  redness, blistering, peeling or loosening of the skin, including inside the mouth  signs and symptoms of a stroke like changes in vision; confusion; trouble speaking or understanding; severe headaches; sudden numbness or weakness of the face, arm or leg; trouble walking; dizziness; loss of balance or coordination  seizures  sleepwalking  suicidal thoughts or other mood changes Side effects that usually do not require medical attention (report to your doctor or health care professional if they continue or are bothersome):  constipation  gas  headache  nausea, vomiting  strange dreams  trouble sleeping This list may not describe all possible side effects. Call your doctor for medical advice about side effects. You may report side effects to FDA at 1-800-FDA-1088. Where should I keep my medicine? Keep out of the reach of children. Store at room temperature between 15 and 30 degrees C (59 and 86 degrees F). Throw away any unused medicine after the expiration date. NOTE: This sheet is a summary. It may not cover all possible information. If you have questions about this medicine, talk to your doctor, pharmacist, or health care provider.  2020 Elsevier/Gold Standard (2017-12-31 14:27:36)

## 2019-05-24 ENCOUNTER — Telehealth: Payer: Self-pay | Admitting: Physician Assistant

## 2019-05-26 NOTE — Telephone Encounter (Signed)
error 

## 2019-05-31 ENCOUNTER — Ambulatory Visit (INDEPENDENT_AMBULATORY_CARE_PROVIDER_SITE_OTHER): Payer: Medicare HMO | Admitting: Licensed Clinical Social Worker

## 2019-05-31 DIAGNOSIS — F1021 Alcohol dependence, in remission: Secondary | ICD-10-CM

## 2019-05-31 DIAGNOSIS — F331 Major depressive disorder, recurrent, moderate: Secondary | ICD-10-CM | POA: Diagnosis not present

## 2019-05-31 NOTE — Progress Notes (Signed)
Virtual Visit via Video Note  I connected with Jennifer [name] on 05/31/19 at  8:00 AM EDT by a video enabled telemedicine application and verified that I am speaking with the correct person using two identifiers.  Location: Patient: Home Provider: Office   I discussed the limitations of evaluation and management by telemedicine and the availability of in person appointments. The patient expressed understanding and agreed to proceed.  THERAPIST PROGRESS NOTE  Session Time: 8:00 am -8:30 am  Participation Level: Active  Behavioral Response: CasualAlertDepressed  Type of Therapy: Individual Therapy  Treatment Goals addressed: Coping  Interventions: CBT and Solution Focused  Case Summary: Jennifer [name] is a 69 y.o. female who presents oriented x5 (person, place, situation, time, and object), casually dressed, appropriately groomed, tall height, thin, and cooperative to address mood and alcohol use. Patient has a history of medical treatment including hypertension, and COPD. Patient has a history of mental health treatment including outpatient therapy and detox. Patient denies suicidal and homicidal ideations. Patient denies psychosis including auditory and visual hallucinations. Patient admits to alcohol use at times but denies other substance abuse. Patient is at low risk for lethality at this time.   Session #8  Physically: Patient was doing well physically over all: sleep, appetite, energy level, and staying sober.  Spiritually/values: No issues identified.  Relationships: Patient got back with her ex but recognized that it wasn't a healthy relationship. She understood his patterns of being sweet for a few weeks then stop calling, etc and she would be left with feelings for him wondering why he didn't call. Her relationship with her mother fluctuates. She feels like her mother's mind is "going." Patient understands and tries not to allow her mother's constant questions bother her.    Emotionally/Mentallly/Behavior: Patient's mood has been stable. Patient denies depressive symptoms. She denies anxiety. Patient is staying sober. She feels like she is in a good spot right now.   Patient engaged in session. Patient responded well to interventions. Patient continues to meet criteria for Major depressive disorder, recurrent episode, moderate. Patient will continue in outpatient therapy due to being the least restrictive service to meet her needs. Patient made moderate progress on her goals.   Suicidal/Homicidal: Negativewithout intent/plan  Therapist Response: Therapist reviewed patient's recent thoughts and behaviors. Therapist utilized CBT and Solution Focused Therapy to address mood. Therapist processed patient's feelings to identify triggers for mood. Therapist had patient identify what has gone well since the last session.    Plan: Return again in 2 weeks. Review treatment plan on or before 07.11.21.   Diagnosis: Axis I: Major depressive disorder, recurrent episode, moderate    Axis II: No diagnosis   I discussed the assessment and treatment plan with the patient. The patient was provided an opportunity to ask questions and all were answered. The patient agreed with the plan and demonstrated an understanding of the instructions.   The patient was advised to call back or seek an in-person evaluation if the symptoms worsen or if the condition fails to improve as anticipated.  I provided 45 minutes of non-face-to-face time during this encounter.   Glori Bickers, LCSW 05/31/2019

## 2019-06-14 ENCOUNTER — Ambulatory Visit: Payer: Medicare HMO | Admitting: Physician Assistant

## 2019-06-14 ENCOUNTER — Ambulatory Visit (HOSPITAL_COMMUNITY): Payer: Medicare HMO | Admitting: Licensed Clinical Social Worker

## 2019-06-28 ENCOUNTER — Ambulatory Visit (INDEPENDENT_AMBULATORY_CARE_PROVIDER_SITE_OTHER): Payer: Medicare HMO | Admitting: Licensed Clinical Social Worker

## 2019-06-28 DIAGNOSIS — F331 Major depressive disorder, recurrent, moderate: Secondary | ICD-10-CM | POA: Diagnosis not present
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
## 2019-07-13 ENCOUNTER — Telehealth: Payer: Self-pay

## 2019-07-13 DIAGNOSIS — Z1239 Encounter for other screening for malignant neoplasm of breast: Secondary | ICD-10-CM

## 2019-07-13 NOTE — Telephone Encounter (Signed)
Copied from [place] [id number]. Topic: Referral - Request for Referral >> Jul 13, 2019  2:14 PM Rainey Pines A wrote: Has patient seen PCP for this complaint? yes *If NO, is insurance requiring patient see PCP for this issue before PCP can refer them? Referral for which specialty: Mammogram Preferred provider/office: [facility] Reason for referral: Preventative care

## 2019-07-14 NOTE — Telephone Encounter (Signed)
Mammogram ordered

## 2019-07-14 NOTE — Addendum Note (Signed)
Addended by: Mar Daring on: 07/14/2019 02:42 PM   Modules accepted: Orders

## 2019-07-14 NOTE — Telephone Encounter (Signed)
Is this okay to order?  Thanks,   -Adelaido Nicklaus  

## 2019-07-27 ENCOUNTER — Ambulatory Visit (INDEPENDENT_AMBULATORY_CARE_PROVIDER_SITE_OTHER): Payer: Medicare HMO | Admitting: Licensed Clinical Social Worker

## 2019-07-27 DIAGNOSIS — F331 Major depressive disorder, recurrent, moderate: Secondary | ICD-10-CM | POA: Diagnosis not present

## 2019-07-27 NOTE — Progress Notes (Signed)
Virtual Visit via Video Note  I connected with [name] on 07/27/19 at  8:00 AM EDT by a video enabled telemedicine application and verified that I am speaking with the correct person using two identifiers.  Location: Patient: Home Provider: Office   I discussed the limitations of evaluation and management by telemedicine and the availability of in person appointments. The patient expressed understanding and agreed to proceed.  THERAPIST PROGRESS NOTE  Session Time: 8:00 am -8:45 am  Participation Level: Active  Behavioral Response: CasualAlertDepressed  Type of Therapy: Individual Therapy  Treatment Goals addressed: Coping  Interventions: CBT and Solution Focused  Case Summary: Jennifer [name] is a 69 y.o. female who presents oriented x5 (person, place, situation, time, and object), casually dressed, appropriately groomed, tall height, thin, and cooperative to address mood and alcohol use. Patient has a history of medical treatment including hypertension, and COPD. Patient has a history of mental health treatment including outpatient therapy and detox. Patient denies suicidal and homicidal ideations. Patient denies psychosis including auditory and visual hallucinations. Patient admits to alcohol use at times but denies other substance abuse. Patient is at low risk for lethality at this time.   Session #  Physically: Patient drank again the previous weekend. She was feeling down.  Spiritually/values: No issues identified.  Relationships: Patient got back with her ex again. She again recognized that his behaviors fell back into old patterns of not calling her, etc. Patient changed her number due to him and then gave it to him.  Emotionally/Mentallly/Behavior: Patient feels like she is not 'living" and only existing. She feels like she can't live due to her concern over her mother. Patient won't allow herself to travel or do things she would like due to feeling like her mother can't  care for herself. Patient understood that her thoughts and perception of the situation is what is causing her feeling.   Patient engaged in session. Patient responded well to interventions. Patient continues to meet criteria for Major depressive disorder, recurrent episode, moderate. Patient will continue in outpatient therapy due to being the least restrictive service to meet her needs. Patient made moderate progress on her goals.   Suicidal/Homicidal: Negativewithout intent/plan  Therapist Response: Therapist reviewed patient's recent thoughts and behaviors. Therapist utilized CBT and Solution Focused Therapy to address mood. Therapist processed patient's feelings to identify triggers for mood. Therapist discussed with patient her relationships including her family as well as romantic. Therapist discussed with patient her thoughts and how it impacts her mood as well as reaction to things.     Plan: Return again in 2 weeks. Review treatment plan on or before 07.11.21.   Diagnosis: Axis I: Major depressive disorder, recurrent episode, moderate    Axis II: No diagnosis   I discussed the assessment and treatment plan with the patient. The patient was provided an opportunity to ask questions and all were answered. The patient agreed with the plan and demonstrated an understanding of the instructions.   The patient was advised to call back or seek an in-person evaluation if the symptoms worsen or if the condition fails to improve as anticipated.  I provided 45 minutes of non-face-to-face time during this encounter.   Glori Bickers, LCSW 07/27/2019

## 2019-08-10 ENCOUNTER — Ambulatory Visit (HOSPITAL_COMMUNITY): Payer: Medicare HMO | Admitting: Licensed Clinical Social Worker

## 2019-08-31 ENCOUNTER — Ambulatory Visit (HOSPITAL_COMMUNITY): Payer: Medicare HMO | Admitting: Licensed Clinical Social Worker

## 2019-09-03 ENCOUNTER — Other Ambulatory Visit: Payer: Self-pay

## 2019-09-03 ENCOUNTER — Emergency Department
Admission: EM | Admit: 2019-09-03 | Discharge: 2019-09-03 | Disposition: A | Discharge status: Home / Self Care | Payer: Medicare HMO | Attending: Emergency Medicine | Admitting: Emergency Medicine

## 2019-09-03 ENCOUNTER — Encounter: Payer: Self-pay | Admitting: Radiology

## 2019-09-03 ENCOUNTER — Emergency Department: Payer: Medicare HMO

## 2019-09-03 DIAGNOSIS — Y929 Unspecified place or not applicable: Secondary | ICD-10-CM | POA: Diagnosis not present

## 2019-09-03 DIAGNOSIS — R52 Pain, unspecified: Secondary | ICD-10-CM | POA: Diagnosis not present

## 2019-09-03 DIAGNOSIS — J449 Chronic obstructive pulmonary disease, unspecified: Secondary | ICD-10-CM | POA: Insufficient documentation

## 2019-09-03 DIAGNOSIS — S52612D Displaced fracture of left ulna styloid process, subsequent encounter for closed fracture with routine healing: Secondary | ICD-10-CM | POA: Diagnosis not present

## 2019-09-03 DIAGNOSIS — T148XXA Other injury of unspecified body region, initial encounter: Secondary | ICD-10-CM

## 2019-09-03 DIAGNOSIS — W06XXXA Fall from bed, initial encounter: Secondary | ICD-10-CM | POA: Insufficient documentation

## 2019-09-03 DIAGNOSIS — Y999 Unspecified external cause status: Secondary | ICD-10-CM | POA: Diagnosis not present

## 2019-09-03 DIAGNOSIS — S52551A Other extraarticular fracture of lower end of right radius, initial encounter for closed fracture: Secondary | ICD-10-CM | POA: Insufficient documentation

## 2019-09-03 DIAGNOSIS — S52552A Other extraarticular fracture of lower end of left radius, initial encounter for closed fracture: Secondary | ICD-10-CM

## 2019-09-03 DIAGNOSIS — M25539 Pain in unspecified wrist: Secondary | ICD-10-CM | POA: Diagnosis not present

## 2019-09-03 DIAGNOSIS — W19XXXA Unspecified fall, initial encounter: Secondary | ICD-10-CM

## 2019-09-03 DIAGNOSIS — S52592A Other fractures of lower end of left radius, initial encounter for closed fracture: Secondary | ICD-10-CM | POA: Diagnosis not present

## 2019-09-03 DIAGNOSIS — S52612A Displaced fracture of left ulna styloid process, initial encounter for closed fracture: Secondary | ICD-10-CM | POA: Insufficient documentation

## 2019-09-03 DIAGNOSIS — E785 Hyperlipidemia, unspecified: Secondary | ICD-10-CM | POA: Insufficient documentation

## 2019-09-03 DIAGNOSIS — I10 Essential (primary) hypertension: Secondary | ICD-10-CM | POA: Insufficient documentation

## 2019-09-03 DIAGNOSIS — S52572D Other intraarticular fracture of lower end of left radius, subsequent encounter for closed fracture with routine healing: Secondary | ICD-10-CM | POA: Diagnosis not present

## 2019-09-03 DIAGNOSIS — Y939 Activity, unspecified: Secondary | ICD-10-CM | POA: Diagnosis not present

## 2019-09-03 DIAGNOSIS — F17210 Nicotine dependence, cigarettes, uncomplicated: Secondary | ICD-10-CM | POA: Diagnosis not present

## 2019-09-03 DIAGNOSIS — M25532 Pain in left wrist: Secondary | ICD-10-CM | POA: Diagnosis not present

## 2019-09-03 MED ORDER — BUPIVACAINE HCL 0.5 % IJ SOLN
5.0000 mL | Freq: Once | INTRAMUSCULAR | Status: DC
  Filled 2019-09-03: qty 5

## 2019-09-03 MED ORDER — OXYCODONE-ACETAMINOPHEN 5-325 MG PO TABS
1.0000 | ORAL_TABLET | ORAL | Status: DC | PRN
  Administered 2019-09-03: 1 via ORAL
  Filled 2019-09-03: qty 1

## 2019-09-03 MED ORDER — OXYCODONE HCL 5 MG PO TABS: 5.0000 mg | ORAL_TABLET | Freq: Three times a day (TID) | ORAL | 0 refills | Status: DC | PRN

## 2019-09-03 MED ORDER — OXYCODONE-ACETAMINOPHEN 5-325 MG PO TABS
2.0000 | ORAL_TABLET | Freq: Once | ORAL | Status: AC
  Administered 2019-09-03: 2 via ORAL
  Filled 2019-09-03: qty 2

## 2019-09-03 MED ORDER — LIDOCAINE HCL (PF) 1 % IJ SOLN
5.0000 mL | Freq: Once | INTRAMUSCULAR | Status: AC
  Administered 2019-09-03: 5 mL
  Filled 2019-09-03: qty 5

## 2019-09-03 MED ORDER — ONDANSETRON 4 MG PO TBDP
4.0000 mg | ORAL_TABLET | Freq: Once | ORAL | Status: AC
  Administered 2019-09-03: 4 mg via ORAL
  Filled 2019-09-03: qty 1

## 2019-09-03 MED ORDER — BUPIVACAINE HCL (PF) 0.5 % IJ SOLN
5.0000 mL | Freq: Once | INTRAMUSCULAR | Status: AC
  Administered 2019-09-03: 5 mL
  Filled 2019-09-03: qty 10

## 2019-09-03 NOTE — Discharge Instructions (Signed)
Please please keep splint on at all times.  Call orthopedic office tomorrow morning to schedule follow-up appointment.  Take oxycodone as needed for severe pain.

## 2019-09-03 NOTE — ED Provider Notes (Signed)
[facility] EMERGENCY DEPARTMENT Provider Note   CSN: [id number] Arrival date & time: 09/03/19  8315     History Chief Complaint  Patient presents with  . Fall    Jennifer [name] is a 69 y.o. female presents to the emergency department for evaluation of fall.  Patient states she tripped, fell getting out of the bed last night, fell onto her left wrist.  She describes moderate to severe pain and swelling throughout the left wrist.  No other injury to her body.  No head injury, LOC, nausea or vomiting.  Initially had a little bit of numbness and tingling of the tips of the digits of the left hand but currently states she has normal sensation.  She is right-hand dominant.  She is currently is not working.  She was given 5 mg of oxycodone upon arrival to the ER, states this did not help much with her pain.  HPI     Past Medical History:  Diagnosis Date  . Arthritis    mild  . Cancer (HCC)    skin cancer leg and chest  . Dysrhythmia    palpitation  . Family history of colon cancer   . Headache    as  a child   . Heart murmur    while pregnant  . Hyperlipidemia 07/17/2017  . Tobacco abuse     Patient Active Problem List   Diagnosis Date Noted  . Alcohol abuse, episodic drinking behavior 08/25/2018  . Aortic atherosclerosis ([place]) 07/19/2018  . Hypertension 04/07/2018  . Osteoporosis 01/03/2018  . COPD (chronic obstructive pulmonary disease) ([place]) 11/02/2017  . Umbilical hernia s/p primary repair 07/16/2017 07/17/2017  . Hyperlipidemia 07/17/2017  . Arthritis   . Adenomatous polyp of ascending colon s/p right colectomy 07/16/2017 07/16/2017  . Genetic testing 05/13/2017  . History of colonic polyps 05/03/2017  . Family history of colon cancer   . Special screening for malignant neoplasms, colon   . Tobacco abuse 03/10/2017    Past Surgical History:  Procedure Laterality Date  . ABDOMINAL HYSTERECTOMY     1996  . COLONOSCOPY WITH PROPOFOL N/A 03/29/2017     Procedure: COLONOSCOPY WITH PROPOFOL;  Surgeon: Lin Landsman, MD;  Location: [facility] ENDOSCOPY;  Service: Gastroenterology;  Laterality: N/A;  . HEMICOLECTOMY     righ  t6-21-19  . LAPAROSCOPIC RIGHT HEMI COLECTOMY Right 07/16/2017   Procedure: LAPAROSCOPIC RIGHT HEMI COLECTOMY;  Surgeon: Ileana Roup, MD;  Location: [facility] ORS;  Service: General;  Laterality: Right;  . TUBAL LIGATION       OB History   No obstetric history on file.     Family History  Problem Relation Age of Onset  . Diabetes Mother   . Kidney disease Mother   . Heart attack Father   . Diabetes Maternal Grandmother   . Colon cancer Maternal Grandfather   . Heart disease Paternal Grandmother   . Colon cancer Paternal Uncle        dx >50, colon completely removed  . Colon cancer Paternal Aunt 61    Social History   Tobacco Use  . Smoking status: Current Every Day Smoker    Packs/day: 0.75    Years: 46.35    Pack years: 34.76    Types: Cigarettes  . Smokeless tobacco: Never Used  Vaping Use  . Vaping Use: Never used  Substance Use Topics  . Alcohol use: Yes    Alcohol/week: 12.0 standard drinks    Types: 12 Shots  of liquor per week    Comment: occasional  . Drug use: No    Home Medications Prior to Admission medications   Medication Sig Start Date End Date Taking? Authorizing Provider  alendronate (FOSAMAX) 70 MG tablet TAKE 1 TABLET BY MOUTH ONCE A WEEK. TAKE ON AN EMPTY STOMACH WITH A FULL GLASS OF WATER 01/31/19   Trinna Post, PA-C  atorvastatin (LIPITOR) 10 MG tablet Take 1 tablet (10 mg total) by mouth daily. 05/15/19   Trinna Post, PA-C  escitalopram (LEXAPRO) 20 MG tablet Take 1 tablet (20 mg total) by mouth daily. 05/15/19   Trinna Post, PA-C  meloxicam (MOBIC) 7.5 MG tablet Take 1 tablet (7.5 mg total) by mouth daily. Patient not taking: Reported on 05/15/2019 01/31/19   Trinna Post, PA-C  oxyCODONE (ROXICODONE) 5 MG immediate release tablet Take 1 tablet (5 mg  total) by mouth every 8 (eight) hours as needed. 09/03/19 09/02/20  Duanne Guess, PA-C  traZODone (DESYREL) 50 MG tablet Take 0.5-1 tablets (25-50 mg total) by mouth at bedtime as needed for sleep. Patient not taking: Reported on 05/15/2019 01/25/19   Trinna Post, PA-C  varenicline (CHANTIX STARTING MONTH PAK) 0.5 MG X 11 & 1 MG X 42 tablet 0.5 mg tablet by mouth once daily for 3 days, then 0.5 mg tablet twice daily for 4 days, then increase to one 1 mg tablet twice daily. 05/15/19   Trinna Post, PA-C    Allergies    Patient has no known allergies.  Review of Systems   Review of Systems  Constitutional: Negative for activity change.  Eyes: Negative for pain and visual disturbance.  Respiratory: Negative for shortness of breath.   Cardiovascular: Negative for chest pain and leg swelling.  Gastrointestinal: Negative for abdominal pain.  Genitourinary: Negative for flank pain and pelvic pain.  Musculoskeletal: Positive for arthralgias and joint swelling. Negative for gait problem, neck pain and neck stiffness.  Skin: Negative for wound.  Neurological: Negative for dizziness, syncope, weakness, light-headedness, numbness and headaches.  Psychiatric/Behavioral: Negative for confusion and decreased concentration.    Physical Exam Updated Vital Signs BP 112/80 (BP Location: Right Arm)   Pulse 79   Temp 98.9 F (37.2 C) (Oral)   Resp 16   Ht 5\' 10"  (1.778 m)   Wt 60.8 kg   SpO2 96%   BMI 19.23 kg/m   Physical Exam Constitutional:      Appearance: She is well-developed.  HENT:     Head: Normocephalic and atraumatic.  Eyes:     Conjunctiva/sclera: Conjunctivae normal.  Cardiovascular:     Rate and Rhythm: Normal rate.  Pulmonary:     Effort: Pulmonary effort is normal. No respiratory distress.  Musculoskeletal:        General: Swelling, tenderness and deformity present.     Cervical back: Normal range of motion.     Comments: Examination of left upper extremity shows  no tenderness at the shoulder elbow or proximal forearm.  Left wrist has significant soft tissue swelling with deformity noted.  There is no skin breakdown noted.  She is able to move her fingers and make a fist.  Normal sensation throughout the digits of the left hand.  2+ radial pulse.  Skin:    General: Skin is warm.     Findings: No rash.  Neurological:     Mental Status: She is alert and oriented to person, place, and time.  Psychiatric:  Behavior: Behavior normal.        Thought Content: Thought content normal.     ED Results / Procedures / Treatments   Labs (all labs ordered are listed, but only abnormal results are displayed) Labs Reviewed - No data to display  EKG None  Radiology DG Forearm Left  Result Date: 09/03/2019 CLINICAL DATA:  Deformity after a fall EXAM: LEFT FOREARM - 2 VIEW; LEFT WRIST - COMPLETE 3+ VIEW COMPARISON:  None. FINDINGS: Comminuted fractures of the distal left radius with fracture lines extending to the radioulnar joint. No definite radiocarpal extension. There is mild dorsal displacement and dorsal angulation of the distal fracture fragments. No obvious radiocarpal dislocation. Mildly displaced ulnar styloid process fracture. Diffuse soft tissue swelling over the wrist. The proximal radius and ulna appear intact. IMPRESSION: Comminuted displaced and angulated fractures of the distal left radius with fracture lines extending to the radioulnar joint. Ulnar styloid process fracture. Electronically Signed   By: Lucienne Capers M.D.   On: 09/03/2019 03:10   DG Wrist 2 Views Left  Result Date: 09/03/2019 CLINICAL DATA:  Post reduction EXAM: LEFT WRIST - 2 VIEW COMPARISON:  Left wrist radiographs from earlier today FINDINGS: Overlying cast obscures fine bone detail. Comminuted nondisplaced intra-articular left distal radius fracture. Nondisplaced left ulnar styloid fracture. Diffuse left wrist soft tissue swelling. No dislocation. No focal osseous lesions.  IMPRESSION: No residual displacement of the distal left radius and ulnar styloid fractures status post reduction. Electronically Signed   By: Ilona Sorrel M.D.   On: 09/03/2019 15:58   DG Wrist Complete Left  Result Date: 09/03/2019 CLINICAL DATA:  Deformity after a fall EXAM: LEFT FOREARM - 2 VIEW; LEFT WRIST - COMPLETE 3+ VIEW COMPARISON:  None. FINDINGS: Comminuted fractures of the distal left radius with fracture lines extending to the radioulnar joint. No definite radiocarpal extension. There is mild dorsal displacement and dorsal angulation of the distal fracture fragments. No obvious radiocarpal dislocation. Mildly displaced ulnar styloid process fracture. Diffuse soft tissue swelling over the wrist. The proximal radius and ulna appear intact. IMPRESSION: Comminuted displaced and angulated fractures of the distal left radius with fracture lines extending to the radioulnar joint. Ulnar styloid process fracture. Electronically Signed   By: Lucienne Capers M.D.   On: 09/03/2019 03:10    Procedures .Ortho Injury Treatment  Date/Time: 09/03/2019 4:42 PM Performed by: Duanne Guess, PA-C Authorized by: Duanne Guess, PA-C   Consent:    Consent obtained:  Verbal   Consent given by:  Patient   Risks discussed:  Fracture and nerve damage   Alternatives discussed:  No treatment and immobilizationInjury location: wrist Location details: left wrist Injury type: fracture Fracture type: distal radius and ulnar styloid Pre-procedure neurovascular assessment: neurovascularly intact Pre-procedure distal perfusion: normal Pre-procedure neurological function: normal Pre-procedure range of motion: normal Anesthesia: hematoma block  Anesthesia: Local anesthesia used: yes Local Anesthetic: lidocaine 1% without epinephrine and bupivacaine 0.5% without epinephrine Anesthetic total: 10 mL  Patient sedated: NoManipulation performed: yes Skeletal traction used: yes Reduction successful: yes X-ray  confirmed reduction: yes Immobilization: splint Splint type: sugar tong Supplies used: cotton padding,  elastic bandage and Ortho-Glass Post-procedure neurovascular assessment: post-procedure neurovascularly intact Post-procedure distal perfusion: normal Post-procedure neurological function: normal Post-procedure range of motion: normal Comments: Sling to left upper extremity    (including critical care time)  Medications Ordered in ED Medications  oxyCODONE-acetaminophen (PERCOCET/ROXICET) 5-325 MG per tablet 1 tablet (1 tablet Oral Given 09/03/19 0231)  oxyCODONE-acetaminophen (PERCOCET/ROXICET) 5-325 MG  per tablet 2 tablet (2 tablets Oral Given 09/03/19 1331)  ondansetron (ZOFRAN-ODT) disintegrating tablet 4 mg (4 mg Oral Given 09/03/19 1331)  lidocaine (PF) (XYLOCAINE) 1 % injection 5 mL (5 mLs Infiltration Given by Other 09/03/19 1520)  bupivacaine (MARCAINE) 0.5 % injection 5 mL (5 mLs Infiltration Given by Other 09/03/19 1520)    ED Course  I have reviewed the triage vital signs and the nursing notes.  Pertinent labs & imaging results that were available during my care of the patient were reviewed by me and considered in my medical decision making (see chart for details).    MDM Rules/Calculators/A&P                         69 year old female with displaced left distal radius fracture.  Neurovascularly intact.  Patient agreed and consented to a hematoma block with closed reduction in the emergency department.  Patient tolerated the procedure well with successful reduction and application of sugar tong splint.  Post reduction films appear well with near normal anatomical alignment.  She is placed into a sling, will follow-up with orthopedics   Final Clinical Impression(s) / ED Diagnoses Final diagnoses:  Fall, initial encounter  Other closed extra-articular fracture of distal end of left radius, initial encounter  Traumatic closed fracture of ulnar styloid with minimal displacement,  left, initial encounter    Rx / DC Orders ED Discharge Orders         Ordered    oxyCODONE (ROXICODONE) 5 MG immediate release tablet  Every 8 hours PRN     Discontinue  Reprint     09/03/19 1619           Duanne Guess, PA-C 09/03/19 1645    Lucrezia Starch, MD 09/03/19 1955

## 2019-09-03 NOTE — ED Notes (Signed)
See triage note, pt with injury to left wrist d/t fall and trying to catch self. Denies hitting head with fall. Pt in NAD

## 2019-09-03 NOTE — ED Triage Notes (Signed)
Pt with SAM splint in place with ems. Pt with deformity noted to left wrist, cms intact. Pt states she fell backwards, catching self with arm.

## 2019-09-05 DIAGNOSIS — S52572A Other intraarticular fracture of lower end of left radius, initial encounter for closed fracture: Secondary | ICD-10-CM | POA: Diagnosis not present

## 2019-09-07 ENCOUNTER — Telehealth: Payer: Self-pay
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
## 2019-09-07 NOTE — Telephone Encounter (Signed)
She would need OV as last visit > 3 months ago.

## 2019-09-07 NOTE — Telephone Encounter (Signed)
Please review. Thanks!  

## 2019-09-08 DIAGNOSIS — S52572S Other intraarticular fracture of lower end of left radius, sequela: Secondary | ICD-10-CM | POA: Diagnosis not present

## 2019-09-08 DIAGNOSIS — G8918 Other acute postprocedural pain: Secondary | ICD-10-CM | POA: Diagnosis not present

## 2019-09-08 DIAGNOSIS — M25532 Pain in left wrist: Secondary | ICD-10-CM | POA: Diagnosis not present

## 2019-09-08 DIAGNOSIS — S52532A Colles' fracture of left radius, initial encounter for closed fracture: Secondary | ICD-10-CM | POA: Diagnosis not present

## 2019-09-08 DIAGNOSIS — S52572A Other intraarticular fracture of lower end of left radius, initial encounter for closed fracture: Secondary | ICD-10-CM | POA: Diagnosis not present

## 2019-09-08 DIAGNOSIS — S52572D Other intraarticular fracture of lower end of left radius, subsequent encounter for closed fracture with routine healing: Secondary | ICD-10-CM | POA: Diagnosis not present

## 2019-09-08 NOTE — Telephone Encounter (Signed)
Patient was advised and scheduled appointment for 09/12/2019 @ 1:00 PM.

## 2019-09-11 NOTE — Progress Notes (Signed)
Established patient visit   Patient: Jennifer [name]   DOB: 1950/06/22   69 y.o. Female  MRN: [id number] Visit Date: 09/12/2019  Today's healthcare provider: Trinna Post, PA-C   Chief Complaint  Patient presents with  . Wrist Injury   Subjective    HPI  Patient presents today wanting to get in home nursing while she recovers from her left wrist surgery she had 5 days ago. She fell onto a cement floor onto outstretched hand. Xray of left wrist showed comminuted displaced and angulated fractures of the distal left radius with fracture lines extending to the radioulnar joint. Ulnar styloid process fracture. Surgery performed by Dr. Peggye Ley 5 days ago. Currently casted and in a sling. PT at [place] pt on Thursday. Follow up with surgeon on 09/21/2019. She does not know how long she will be casted.      Medications: Outpatient Medications Prior to Visit  Medication Sig  . alendronate (FOSAMAX) 70 MG tablet TAKE 1 TABLET BY MOUTH ONCE A WEEK. TAKE ON AN EMPTY STOMACH WITH A FULL GLASS OF WATER  . atorvastatin (LIPITOR) 10 MG tablet Take 1 tablet (10 mg total) by mouth daily.  Marland Kitchen escitalopram (LEXAPRO) 20 MG tablet Take 1 tablet (20 mg total) by mouth daily.  . meloxicam (MOBIC) 7.5 MG tablet Take 1 tablet (7.5 mg total) by mouth daily. (Patient not taking: Reported on 05/15/2019)  . oxyCODONE (ROXICODONE) 5 MG immediate release tablet Take 1 tablet (5 mg total) by mouth every 8 (eight) hours as needed.  . traZODone (DESYREL) 50 MG tablet Take 0.5-1 tablets (25-50 mg total) by mouth at bedtime as needed for sleep. (Patient not taking: Reported on 05/15/2019)  . varenicline (CHANTIX STARTING MONTH PAK) 0.5 MG X 11 & 1 MG X 42 tablet 0.5 mg tablet by mouth once daily for 3 days, then 0.5 mg tablet twice daily for 4 days, then increase to one 1 mg tablet twice daily.   No facility-administered medications prior to visit.    Review of Systems  Constitutional: Negative.   Respiratory:  Negative.   Cardiovascular: Negative.   Musculoskeletal: Positive for arthralgias and neck pain.      Objective    BP 121/72   Pulse 63   Temp 98.6 F (37 C)   Ht 5\' 10"  (1.778 m)   Wt 140 lb 3.2 oz (63.6 kg)   BMI 20.12 kg/m    Physical Exam Constitutional:      Appearance: Normal appearance.  Musculoskeletal:     Comments: Left arm casted and in a brace   Neurological:     Mental Status: She is alert.  Psychiatric:        Mood and Affect: Mood normal.        Behavior: Behavior normal.       No results found for any visits on 09/12/19.  Assessment & Plan    1. Closed fracture of left wrist, sequela  - Ambulatory referral to [facility]  Return if symptoms worsen or fail to improve.      ITrinna Post, PA-C, have reviewed all documentation for this visit. The documentation on 09/12/19 for the exam, diagnosis, procedures, and orders are all accurate and complete.  I spent 20 minutes dedicated to the care of this patient on the date of this encounter to include pre-visit review of records, face-to-face time with the patient discussing left wrist fracture, and post visit ordering of testing.  The entirety of  the information documented in the History of Present Illness, Review of Systems and Physical Exam were personally obtained by me. Portions of this information were initially documented by [facility] and reviewed by me for thoroughness and accuracy.    Paulene Floor  [facility] [phone] (phone) [phone] (fax)  [place]

## 2019-09-12 ENCOUNTER — Ambulatory Visit (INDEPENDENT_AMBULATORY_CARE_PROVIDER_SITE_OTHER): Payer: Medicare HMO | Admitting: Physician Assistant

## 2019-09-12 ENCOUNTER — Other Ambulatory Visit: Payer: Self-pay

## 2019-09-12 ENCOUNTER — Encounter: Payer: Self-pay | Admitting: Physician Assistant

## 2019-09-12 VITALS — BP 121/72 | HR 63 | Temp 98.6°F | Ht 70.0 in | Wt 140.2 lb

## 2019-09-12 DIAGNOSIS — M25532 Pain in left wrist: Secondary | ICD-10-CM

## 2019-09-12 DIAGNOSIS — S62102S Fracture of unspecified carpal bone, left wrist, sequela: Secondary | ICD-10-CM | POA: Diagnosis not present

## 2019-09-12 NOTE — Patient Instructions (Signed)
Wrist Fracture Rehab Ask your health care provider which exercises are safe for you. Do exercises exactly as told by your health care provider and adjust them as directed. It is normal to feel mild stretching, pulling, tightness, or discomfort as you do these exercises. Stop right away if you feel sudden pain or your pain gets worse. Do not begin these exercises until told by your health care provider. Stretching and range-of-motion exercises These exercises warm up your muscles and joints and improve the movement and flexibility of your wrist and hand. These exercises also help to relieve pain, numbness, and tingling. Finger flexion and extension 1. Sit or stand with your elbow at your side. 2. Open and stretch your left / right fingers as wide as you can (extension). 3. Hold this position for _________ seconds. 4. Close your left / right fingers into a gentle fist (flexion). 5. Hold this position for _________ seconds. 6. Slowly return to the starting position. Repeat __________ times. Complete this exercise __________ times a day. Wrist flexion 1. Bend your left / right elbow to a 90-degree angle (right angle) with your palm facing the floor. 2. Bend your wrist forward so your fingers point toward the floor (flexion). 3. Hold this position for ___________ seconds. 4. Slowly return to the starting position. Repeat __________ times. Complete this exercise __________ times a day. Wrist extension 1. Bend your left / right elbow to a 90-degree angle (right angle) with your palm facing the floor. 2. Bend your wrist backward so your fingers point toward the ceiling (extension). 3. Hold this position for ___________ seconds. 4. Slowly return to the starting position. Repeat __________ times. Complete this exercise __________ times a day. Ulnar deviation 1. Bend your left / right elbow to a 90-degree angle (right angle), and rest your forearm on a table with your palm facing down. 2. Keeping your  hand flat on the table, bend your left / right wrist toward your small finger (pinkie). This is ulnar deviation. 3. Hold this position for __________ seconds. 4. Slowly return to the starting position. Repeat __________ times. Complete this exercise __________ times a day. Radial deviation 1. Bend your left / right elbow to a 90-degree angle (right angle), and rest your forearm on a table with your palm facing down. 2. Keeping your hand flat on the table, bend your left / right wrist toward your thumb. This is radial deviation. 3. Hold this position for __________ seconds. 4. Slowly return to the starting position. Repeat __________ times. Complete this exercise __________ times a day. Forearm rotation, supination 1. Stand or sit with your left / right elbow bent to a 90-degree angle (right angle) at your side. Position your forearm so that the thumb is facing the ceiling (neutral position). 2. Turn (rotate) your palm up toward the ceiling (supination), stopping when you feel a gentle stretch. 3. Hold this position for __________ seconds. 4. Slowly return to the starting position. Repeat __________ times. Complete this exercise __________ times a day. Forearm rotation, pronation 1. Stand or sit with your left / right elbow bent to a 90-degree angle (right angle) at your side. Position your forearm so that the thumb is facing the ceiling (neutral position). 2. Rotate your palm down toward the floor (pronation), stopping when you feel a gentle stretch. 3. Hold this position for __________ seconds. 4. Slowly return to the starting position. Repeat __________ times. Complete this exercise __________ times a day. Wrist flexion, assisted  1. Extend your left /  right arm in front of you and turn your palm down toward the floor. ? If told by your health care provider, bend your left / right arm to a 90-degree angle (right angle) at your side. 2. Using your uninjured hand, gently press over the back  of your left / right hand (assisted) to bend your wrist and fingers toward the floor (flexion). Go as far as you can to feel a stretch without causing pain. 3. Hold this position for __________ seconds. 4. Slowly return to the starting position. Repeat __________ times. Complete this exercise __________ times a day. Wrist extension, assisted  1. Extend your left / right arm in front of you and turn your palm up toward the ceiling. ? If told by your health care provider, bend your left / right arm to a 90-degree angle (right angle) at your side. 2. Using your uninjured hand, gently press over the palm of your left / right hand (assisted) to bend your wrist and fingers toward the floor (extension). Go as far as you can to feel a stretch without causing pain. 3. Hold this position for __________ seconds. 4. Slowly return to the starting position. Repeat __________ times. Complete this exercise __________ times a day. Assisted forearm rotation, supination 1. Stand or sit with your arms at your sides. 2. Bend your left / right elbow to a 90-degree angle (right angle). 3. Using your uninjured hand, turn your left / right palm up toward the ceiling (assisted supination) until you feel a gentle stretch in the inside of your forearm. 4. Hold this position for __________ seconds. 5. Slowly return to the starting position. Repeat __________ times. Complete this exercise __________ times a day. Assisted forearm rotation, pronation 1. Stand or sit with your arms at your sides. 2. Bend your left / right elbow to a 90-degree angle (right angle). 3. Using your uninjured hand, turn your left / right palm down toward the floor (assisted pronation) until you feel a gentle stretch in the top of your forearm. 4. Hold this position for __________ seconds. 5. Slowly return to the starting position. Repeat __________ times. Complete this exercise __________ times a day. Strengthening exercises These exercises  build strength and endurance in your wrist and hand. Endurance is the ability to use your muscles for a long time, even after they get tired. Wrist flexion 1. Sit with your left / right forearm supported on a table. Your elbow should be at waist height. 2. Rest your hand over the edge of the table, palm up. 3. Gently grasp a __________ lb (kg) weight. Or, hold an exercise band or tube in both hands, keeping your hands at the same level and hip distance apart. There should be slight tension in the exercise band or tube. 4. Without moving your forearm or elbow, slowly bend your wrist up toward the ceiling (wrist flexion). 5. Hold this position for __________ seconds. 6. Slowly return to the starting position. Repeat __________ times. Complete this exercise __________ times a day. Wrist extension 1. Sit with your left / right forearm supported on a table. Your elbow should be at waist height. 2. Rest your hand over the edge of the table, palm down. 3. Gently grasp a __________ lb (kg) weight. Or, hold an exercise band or tube in both hands, keeping your hands at the same level and hip distance apart. There should be slight tension in the exercise band or tube. 4. Without moving your forearm or elbow, slowly curl your  hand up toward the ceiling (extension). 5. Hold this position for __________ seconds. 6. Slowly return to the starting position. Repeat __________ times. Complete this exercise __________ times a day. Forearm rotation, supination  1. Sit with your left / right forearm supported on a table. Your elbow should be at waist height. 2. Rest your hand over the edge of the table, palm down. 3. Gently grasp a lightweight hammer near the head. As this exercise gets easier for you, try holding the hammer farther down the handle. 4. Without moving your elbow, slowly rotate your palm up toward the ceiling (supination). 5. Hold this position for __________ seconds. 6. Slowly return to the  starting position. Repeat __________ times. Complete this exercise __________ times a day. Forearm rotation, pronation  1. Sit with your left / right forearm supported on a table. Your elbow should be at waist height. 2. Rest your hand over the edge of the table, palm up. 3. Gently grasp a lightweight hammer near the head. As this exercise gets easier for you, try holding the hammer farther down the handle. 4. Without moving your elbow, slowly rotate your palm down toward the floor (pronation). 5. Hold this position for __________ seconds. 6. Slowly return to the starting position. Repeat __________ times. Complete this exercise __________ times a day. Grip strengthening  1. Hold one of these items in your left / right hand: a dense sponge, a stress ball, or a large, rolled sock. 2. Slowly squeeze the object as hard as you can without increasing any pain. 3. Hold your squeeze for __________ seconds. 4. Slowly release your grip. Repeat __________ times. Complete this exercise __________ times a day. This information is not intended to replace advice given to you by your health care provider. Make sure you discuss any questions you have with your health care provider. Document Revised: 05/02/2018 Document Reviewed: 04/04/2018 [employer] Patient Education  [place].

## 2019-09-13 NOTE — Progress Notes (Signed)
Subjective:   Jennifer [name] is a 69 y.o. female who presents for an Initial Medicare Annual Wellness Visit.  I connected with Jennifer [name] today by telephone and verified that I am speaking with the correct person using two identifiers. Location patient: home Location provider: work Persons participating in the virtual visit: patient, provider.   I discussed the limitations, risks, security and privacy concerns of performing an evaluation and management service by telephone and the availability of in person appointments. I also discussed with the patient that there may be a patient responsible charge related to this service. The patient expressed understanding and verbally consented to this telephonic visit.    Interactive audio and video telecommunications were attempted between this provider and patient, however failed, due to patient having technical difficulties OR patient did not have access to video capability.  We continued and completed visit with audio only.   Review of Systems    N/A  Cardiac Risk Factors include: advanced age (>73men, >6 women);sedentary lifestyle;smoking/ tobacco exposure;dyslipidemia;hypertension     Objective:    Today's Vitals   09/14/19 1509  PainSc: 4    There is no height or weight on file to calculate BMI.  Advanced Directives 09/14/2019 08/24/2018 07/18/2018 07/01/2018 06/28/2018 07/16/2017 07/13/2017  Does Patient Have a Medical Advance Directive? No No No No No No No  Would patient like information on creating a medical advance directive? No - Patient declined No - Patient declined - - No - Patient declined No - Patient declined No - Patient declined    Current Medications (verified) Outpatient Encounter Medications as of 09/14/2019  Medication Sig  . alendronate (FOSAMAX) 70 MG tablet TAKE 1 TABLET BY MOUTH ONCE A WEEK. TAKE ON AN EMPTY STOMACH WITH A FULL GLASS OF WATER  . atorvastatin (LIPITOR) 10 MG tablet Take 1 tablet (10 mg total) by mouth  daily.  Marland Kitchen escitalopram (LEXAPRO) 20 MG tablet Take 1 tablet (20 mg total) by mouth daily.  . traZODone (DESYREL) 50 MG tablet Take 0.5-1 tablets (25-50 mg total) by mouth at bedtime as needed for sleep.  . varenicline (CHANTIX STARTING MONTH PAK) 0.5 MG X 11 & 1 MG X 42 tablet 0.5 mg tablet by mouth once daily for 3 days, then 0.5 mg tablet twice daily for 4 days, then increase to one 1 mg tablet twice daily.  Marland Kitchen ibuprofen (ADVIL) 600 MG tablet Take 600 mg by mouth as needed.  . meloxicam (MOBIC) 7.5 MG tablet Take 1 tablet (7.5 mg total) by mouth daily. (Patient not taking: Reported on 05/15/2019)  . oxyCODONE (ROXICODONE) 5 MG immediate release tablet Take 1 tablet (5 mg total) by mouth every 8 (eight) hours as needed. (Patient not taking: Reported on 09/14/2019)   No facility-administered encounter medications on file as of 09/14/2019.    Allergies (verified) Patient has no known allergies.   History: Past Medical History:  Diagnosis Date  . Arthritis    mild  . Cancer (HCC)    skin cancer leg and chest  . Dysrhythmia    palpitation  . Family history of colon cancer   . Headache    as  a child   . Heart murmur    while pregnant  . Hyperlipidemia 07/17/2017  . Tobacco abuse    Past Surgical History:  Procedure Laterality Date  . ABDOMINAL HYSTERECTOMY     1996  . COLONOSCOPY WITH PROPOFOL N/A 03/29/2017   Procedure: COLONOSCOPY WITH PROPOFOL;  Surgeon: Lin Landsman, MD;  Location: [facility] ENDOSCOPY;  Service: Gastroenterology;  Laterality: N/A;  . HEMICOLECTOMY     righ  [id number]  . LAPAROSCOPIC RIGHT HEMI COLECTOMY Right 07/16/2017   Procedure: LAPAROSCOPIC RIGHT HEMI COLECTOMY;  Surgeon: Ileana Roup, MD;  Location: [facility];  Service: General;  Laterality: Right;  . TUBAL LIGATION     Family History  Problem Relation Age of Onset  . Diabetes Mother   . Kidney disease Mother   . Heart attack Father   . Diabetes Maternal Grandmother   . Colon cancer Maternal  Grandfather   . Heart disease Paternal Grandmother   . Colon cancer Paternal Uncle        dx >50, colon completely removed  . Colon cancer Paternal Aunt 59   Social History   Socioeconomic History  . Marital status: Divorced    Spouse name: Not on file  . Number of children: 1  . Years of education: Not on file  . Highest education level: 10th grade  Occupational History  . Occupation: retired  Tobacco Use  . Smoking status: Current Every Day Smoker    Packs/day: 0.75    Years: 46.35    Pack years: 34.76    Types: Cigarettes  . Smokeless tobacco: Never Used  . Tobacco comment: Currently on Chantix  Vaping Use  . Vaping Use: Never used  Substance and Sexual Activity  . Alcohol use: Yes    Alcohol/week: 14.0 standard drinks    Types: 14 Shots of liquor per week    Comment: "Im an alcoholic"   . Drug use: No  . Sexual activity: Not Currently  Other Topics Concern  . Not on file  Social History Narrative   1 son deceased   Social Determinants of Health   Financial Resource Strain: Low Risk   . Difficulty of Paying Living Expenses: Not hard at all  Food Insecurity: No Food Insecurity  . Worried About Charity fundraiser in the Last Year: Never true  . Ran Out of Food in the Last Year: Never true  Transportation Needs: No Transportation Needs  . Lack of Transportation (Medical): No  . Lack of Transportation (Non-Medical): No  Physical Activity: Inactive  . Days of Exercise per Week: 0 days  . Minutes of Exercise per Session: 0 min  Stress: Stress Concern Present  . Feeling of Stress : Very much  Social Connections: Socially Isolated  . Frequency of Communication with Friends and Family: More than three times a week  . Frequency of Social Gatherings with Friends and Family: More than three times a week  . Attends Religious Services: Never  . Active Member of Clubs or Organizations: No  . Attends Archivist Meetings: Never  . Marital Status: Divorced     Tobacco Counseling Ready to quit: Yes Counseling given: No Comment: Currently on Chantix   Clinical Intake:  Pre-visit preparation completed: Yes  Pain : 0-10 Pain Score: 4  Pain Type: Chronic pain Pain Location: Arm Pain Orientation: Left Pain Descriptors / Indicators: Aching Pain Frequency: Intermittent Pain Relieving Factors: Currently nothing.  Pain Relieving Factors: Currently nothing.  Nutritional Risks: None Diabetes: No  How often do you need to have someone help you when you read instructions, pamphlets, or other written materials from your doctor or pharmacy?: 1 - Never  Diabetic? No  Interpreter Needed?: No  Information entered by :: Mmarkoski, LPN   Activities of Daily Living In your present state of health, do you have any difficulty  performing the following activities: 09/14/2019 01/25/2019  Hearing? N Y  Vision? N N  Difficulty concentrating or making decisions? N N  Walking or climbing stairs? N N  Dressing or bathing? Y N  Comment Currently due to a broken wrist. -  Doing errands, shopping? N N  Preparing Food and eating ? N -  Using the Toilet? N -  In the past six months, have you accidently leaked urine? N -  Do you have problems with loss of bowel control? N -  Managing your Medications? N -  Managing your Finances? N -  Housekeeping or managing your Housekeeping? N -  Some recent data might be hidden    Patient Care Team: Paulene Floor as PCP - General (Physician Assistant) Sheets, Vonna Kotyk, [place] as Counselor (Licensed Clinical Social Worker) Dingeldein, Remo Lipps, MD (Ophthalmology)  Indicate any recent Medical Services you may have received from other than [facility] providers in the past year (date may be approximate).     Assessment:   This is a routine wellness examination for Dalisha.  Hearing/Vision screen No exam data present  Dietary issues and exercise activities discussed: Current Exercise Habits: The patient does  not participate in regular exercise at present, Exercise limited by: orthopedic condition(s)  Goals    . Prevent falls     Recommend to remove any items from the home that may cause slips or trips.    . Quit Smoking     Recommend to continue efforts to reduce smoking habits until no longer smoking.     . Reduce alcohol intake     Recommend to cut back to no more than 1 serving of alcohol a day.      Depression Screen PHQ 2/9 Scores 09/14/2019 05/15/2019 01/25/2019 07/27/2018 07/15/2018 11/02/2017 03/09/2017  PHQ - 2 Score 0 1 2 1  0 0 3  PHQ- 9 Score - 4 7 4 2 1 6     Fall Risk Fall Risk  09/14/2019 05/15/2019 01/25/2019 11/02/2017 03/09/2017  Falls in the past year? 1 1 1  No No  Number falls in past yr: 0 0 0 - -  Injury with Fall? 1 1 1  - -  Comment Broke the left wrist. - - - -  Follow up Falls prevention discussed - - - -    Any stairs in or around the home? No  If so, are there any without handrails? No  Home free of loose throw rugs in walkways, pet beds, electrical cords, etc? Yes  Adequate lighting in your home to reduce risk of falls? Yes   ASSISTIVE DEVICES UTILIZED TO PREVENT FALLS:  Life alert? No  Use of a cane, walker or w/c? No  Grab bars in the bathroom? Yes  Shower chair or bench in shower? No  Elevated toilet seat or a handicapped toilet? Yes    Cognitive Function: Declined today.        Immunizations Immunization History  Administered Date(s) Administered  . Fluad Quad(high Dose 65+) 01/25/2019  . Influenza, High Dose Seasonal PF 11/02/2017  . Moderna SARS-COVID-2 Vaccination 02/14/2019, 03/14/2019  . Pneumococcal Conjugate-13 03/25/2017  . Pneumococcal Polysaccharide-23 01/25/2019    TDAP status: Due, Education has been provided regarding the importance of this vaccine. Advised may receive this vaccine at local pharmacy or Health Dept. Aware to provide a copy of the vaccination record if obtained from local pharmacy or Health Dept. Verbalized  acceptance and understanding. Flu Vaccine status: Up to date Pneumococcal vaccine status: Up to date  Covid-19 vaccine status: Completed vaccines  Qualifies for Shingles Vaccine? Yes   Zostavax completed No   Shingrix Completed?: No.    Education has been provided regarding the importance of this vaccine. Patient has been advised to call insurance company to determine out of pocket expense if they have not yet received this vaccine. Advised may also receive vaccine at local pharmacy or Health Dept. Verbalized acceptance and understanding.  Screening Tests Health Maintenance  Topic Date Due  . MAMMOGRAM  Never done  . DEXA SCAN  09/08/2019  . INFLUENZA VACCINE  08/27/2019  . TETANUS/TDAP  09/13/2020 (Originally 02/06/1969)  . COLONOSCOPY  03/30/2027  . COVID-19 Vaccine  Completed  . Hepatitis C Screening  Completed  . PNA vac Low Risk Adult  Completed    Health Maintenance  Health Maintenance Due  Topic Date Due  . MAMMOGRAM  Never done  . DEXA SCAN  09/08/2019  . INFLUENZA VACCINE  08/27/2019    Colorectal cancer screening: Completed 03/29/17. Repeat every 10 years Mammogram status: Ordered 07/14/19. Pt advised to call to schedule appt.  Bone Density status: Currently due. Declined order at this time.  Lung Cancer Screening: (Low Dose CT Chest recommended if Age 43-80 years, 30 pack-year currently smoking OR have quit w/in 15years.) does qualify, however had this completed 10/13/18. Repeat yearly.   Additional Screening:  Hepatitis C Screening: Up to date  Vision Screening: Recommended annual ophthalmology exams for early detection of glaucoma and other disorders of the eye. Is the patient up to date with their annual eye exam?  Yes  Who is the provider or what is the name of the office in which the patient attends annual eye exams? Dr Sandra Cockayne If pt is not established with a provider, would they like to be referred to a provider to establish care? No .   Dental Screening:  Recommended annual dental exams for proper oral hygiene  Community Resource Referral / Chronic Care Management: CRR required this visit?  No   CCM required this visit?  No      Plan:     I have personally reviewed and noted the following in the patient's chart:   . Medical and social history . Use of alcohol, tobacco or illicit drugs  . Current medications and supplements . Functional ability and status . Nutritional status . Physical activity . Advanced directives . List of other physicians . Hospitalizations, surgeries, and ER visits in previous 12 months . Vitals . Screenings to include cognitive, depression, and falls . Referrals and appointments  In addition, I have reviewed and discussed with patient certain preventive protocols, quality metrics, and best practice recommendations. A written personalized care plan for preventive services as well as general preventive health recommendations were provided to patient.     Teia Freitas Brooks, [place]   [id number]   Nurse Notes: Pt declined a DEXA order today. Pt plans to set up her mammogram this year.

## 2019-09-14 ENCOUNTER — Other Ambulatory Visit: Payer: Self-pay

## 2019-09-14 ENCOUNTER — Ambulatory Visit (INDEPENDENT_AMBULATORY_CARE_PROVIDER_SITE_OTHER): Payer: Medicare HMO

## 2019-09-14 DIAGNOSIS — Z0000 Encounter for general adult medical examination without abnormal findings: Secondary | ICD-10-CM | POA: Diagnosis not present

## 2019-09-14 DIAGNOSIS — S52572D Other intraarticular fracture of lower end of left radius, subsequent encounter for closed fracture with routine healing: Secondary | ICD-10-CM | POA: Diagnosis not present

## 2019-09-14 DIAGNOSIS — S52531D Colles' fracture of right radius, subsequent encounter for closed fracture with routine healing: Secondary | ICD-10-CM | POA: Diagnosis not present

## 2019-09-14 NOTE — Patient Instructions (Signed)
Jennifer [name] , Thank you for taking time to come for your Medicare Wellness Visit. I appreciate your ongoing commitment to your health goals. Please review the following plan we discussed and let me know if I can assist you in the future.   Screening recommendations/referrals: Colonoscopy: Up to date, due 03/2027 Mammogram: Ordered 07/14/19. Pt to call and set up apt.  Bone Density: Currently due, declined order at this time.  Recommended yearly ophthalmology/optometry visit for glaucoma screening and checkup Recommended yearly dental visit for hygiene and checkup  Vaccinations: Influenza vaccine: Due fall 2021 Pneumococcal vaccine: Completed series Tdap vaccine: Currently due, declined at this time. Shingles vaccine: Shingrix discussed. Please contact your pharmacy for coverage information.     Advanced directives: Advance directive discussed with you today. Even though you declined this today please call our office should you change your mind and we can give you the proper paperwork for you to fill out.  Conditions/risks identified: Smoking and alcohol cessation discussed today. Fall risk preventatives discussed today.   Next appointment: 01/29/20 @ 2:00 PM with Carles Collet.   Preventive Care 21 Years and Older, Female Preventive care refers to lifestyle choices and visits with your health care provider that can promote health and wellness. What does preventive care include?  A yearly physical exam. This is also called an annual well check.  Dental exams once or twice a year.  Routine eye exams. Ask your health care provider how often you should have your eyes checked.  Personal lifestyle choices, including:  Daily care of your teeth and gums.  Regular physical activity.  Eating a healthy diet.  Avoiding tobacco and drug use.  Limiting alcohol use.  Practicing safe sex.  Taking low-dose aspirin every day.  Taking vitamin and mineral supplements as recommended by your  health care provider. What happens during an annual well check? The services and screenings done by your health care provider during your annual well check will depend on your age, overall health, lifestyle risk factors, and family history of disease. Counseling  Your health care provider may ask you questions about your:  Alcohol use.  Tobacco use.  Drug use.  Emotional well-being.  Home and relationship well-being.  Sexual activity.  Eating habits.  History of falls.  Memory and ability to understand (cognition).  Work and work Statistician.  Reproductive health. Screening  You may have the following tests or measurements:  Height, weight, and BMI.  Blood pressure.  Lipid and cholesterol levels. These may be checked every 5 years, or more frequently if you are over 12 years old.  Skin check.  Lung cancer screening. You may have this screening every year starting at age 41 if you have a 30-pack-year history of smoking and currently smoke or have quit within the past 15 years.  Fecal occult blood test (FOBT) of the stool. You may have this test every year starting at age 34.  Flexible sigmoidoscopy or colonoscopy. You may have a sigmoidoscopy every 5 years or a colonoscopy every 10 years starting at age 19.  Hepatitis C blood test.  Hepatitis B blood test.  Sexually transmitted disease (STD) testing.  Diabetes screening. This is done by checking your blood sugar (glucose) after you have not eaten for a while (fasting). You may have this done every 1-3 years.  Bone density scan. This is done to screen for osteoporosis. You may have this done starting at age 37.  Mammogram. This may be done every 1-2 years. Talk to  your health care provider about how often you should have regular mammograms. Talk with your health care provider about your test results, treatment options, and if necessary, the need for more tests. Vaccines  Your health care provider may recommend  certain vaccines, such as:  Influenza vaccine. This is recommended every year.  Tetanus, diphtheria, and acellular pertussis (Tdap, Td) vaccine. You may need a Td booster every 10 years.  Zoster vaccine. You may need this after age 25.  Pneumococcal 13-valent conjugate (PCV13) vaccine. One dose is recommended after age 53.  Pneumococcal polysaccharide (PPSV23) vaccine. One dose is recommended after age 80. Talk to your health care provider about which screenings and vaccines you need and how often you need them. This information is not intended to replace advice given to you by your health care provider. Make sure you discuss any questions you have with your health care provider. Document Released: 02/08/2015 Document Revised: 10/02/2015 Document Reviewed: 11/13/2014 Elsevier Interactive Patient Education  2017 [place] Prevention in the Home Falls can cause injuries. They can happen to people of all ages. There are many things you can do to make your home safe and to help prevent falls. What can I do on the outside of my home?  Regularly fix the edges of walkways and driveways and fix any cracks.  Remove anything that might make you trip as you walk through a door, such as a raised step or threshold.  Trim any bushes or trees on the path to your home.  Use bright outdoor lighting.  Clear any walking paths of anything that might make someone trip, such as rocks or tools.  Regularly check to see if handrails are loose or broken. Make sure that both sides of any steps have handrails.  Any raised decks and porches should have guardrails on the edges.  Have any leaves, snow, or ice cleared regularly.  Use sand or salt on walking paths during winter.  Clean up any spills in your garage right away. This includes oil or grease spills. What can I do in the bathroom?  Use night lights.  Install grab bars by the toilet and in the tub and shower. Do not use towel bars as  grab bars.  Use non-skid mats or decals in the tub or shower.  If you need to sit down in the shower, use a plastic, non-slip stool.  Keep the floor dry. Clean up any water that spills on the floor as soon as it happens.  Remove soap buildup in the tub or shower regularly.  Attach bath mats securely with double-sided non-slip rug tape.  Do not have throw rugs and other things on the floor that can make you trip. What can I do in the bedroom?  Use night lights.  Make sure that you have a light by your bed that is easy to reach.  Do not use any sheets or blankets that are too big for your bed. They should not hang down onto the floor.  Have a firm chair that has side arms. You can use this for support while you get dressed.  Do not have throw rugs and other things on the floor that can make you trip. What can I do in the kitchen?  Clean up any spills right away.  Avoid walking on wet floors.  Keep items that you use a lot in easy-to-reach places.  If you need to reach something above you, use a strong step stool that has  a grab bar.  Keep electrical cords out of the way.  Do not use floor polish or wax that makes floors slippery. If you must use wax, use non-skid floor wax.  Do not have throw rugs and other things on the floor that can make you trip. What can I do with my stairs?  Do not leave any items on the stairs.  Make sure that there are handrails on both sides of the stairs and use them. Fix handrails that are broken or loose. Make sure that handrails are as long as the stairways.  Check any carpeting to make sure that it is firmly attached to the stairs. Fix any carpet that is loose or worn.  Avoid having throw rugs at the top or bottom of the stairs. If you do have throw rugs, attach them to the floor with carpet tape.  Make sure that you have a light switch at the top of the stairs and the bottom of the stairs. If you do not have them, ask someone to add them  for you. What else can I do to help prevent falls?  Wear shoes that:  Do not have high heels.  Have rubber bottoms.  Are comfortable and fit you well.  Are closed at the toe. Do not wear sandals.  If you use a stepladder:  Make sure that it is fully opened. Do not climb a closed stepladder.  Make sure that both sides of the stepladder are locked into place.  Ask someone to hold it for you, if possible.  Clearly mark and make sure that you can see:  Any grab bars or handrails.  First and last steps.  Where the edge of each step is.  Use tools that help you move around (mobility aids) if they are needed. These include:  Canes.  Walkers.  Scooters.  Crutches.  Turn on the lights when you go into a dark area. Replace any light bulbs as soon as they burn out.  Set up your furniture so you have a clear path. Avoid moving your furniture around.  If any of your floors are uneven, fix them.  If there are any pets around you, be aware of where they are.  Review your medicines with your doctor. Some medicines can make you feel dizzy. This can increase your chance of falling. Ask your doctor what other things that you can do to help prevent falls. This information is not intended to replace advice given to you by your health care provider. Make sure you discuss any questions you have with your health care provider. Document Released: 11/08/2008 Document Revised: 06/20/2015 Document Reviewed: 02/16/2014 Elsevier Interactive Patient Education  2017 [employer].

## 2019-09-18 ENCOUNTER — Telehealth: Payer: Self-pay

## 2019-09-18 NOTE — Telephone Encounter (Signed)
Copied from [place] [id number]. Topic: General - Other >> Sep 18, 2019  3:35 PM Keene Breath wrote: Reason for CRM: Patient called to ask the doctor to call her about getting some home health assistance.  Stated that she called before and has not heard back from the nurse or doctor.  Patient stated she broke her had and really needs help.  CB# [phone]

## 2019-09-25 NOTE — Telephone Encounter (Signed)
Jennifer [name], can you review this? It looks like we tried to get patient home health assistance and it was denied by her insurance. I wanted to be sure before calling the patient. Thanks!

## 2019-09-25 NOTE — Telephone Encounter (Signed)
Yes it was, Parke Poisson suggested trying to file it through her medicaid. She sent over a form before I left but I can't find it now. Can we reach out and ask what the form is, then can we print and fill out to the best of our ability and I will complete the rest? Thanks.

## 2019-09-26 DIAGNOSIS — S52572D Other intraarticular fracture of lower end of left radius, subsequent encounter for closed fracture with routine healing: Secondary | ICD-10-CM | POA: Diagnosis not present

## 2019-09-26 NOTE — Telephone Encounter (Signed)
Jasmine Pang! Do you remember what form you used for home health? Thanks!

## 2019-09-27 NOTE — Telephone Encounter (Signed)
Hi [name]. It's called [phone]. You can just go on line and print it off. Fax # is [phone]

## 2019-09-30 ENCOUNTER — Telehealth: Payer: Self-pay | Admitting: *Deleted

## 2019-09-30 NOTE — Telephone Encounter (Signed)
Attempted to call Jennifer [name] to inform her that her lung cancer screening scan is due soon. Could not leave a voicemail since her voicemail option is unavailable at the time.

## 2019-10-20 ENCOUNTER — Telehealth: Payer: Self-pay | Admitting: *Deleted

## 2019-10-20 DIAGNOSIS — Z87891 Personal history of nicotine dependence: Secondary | ICD-10-CM

## 2019-10-20 DIAGNOSIS — Z122 Encounter for screening for malignant neoplasm of respiratory organs: Secondary | ICD-10-CM

## 2019-10-20 NOTE — Telephone Encounter (Addendum)
(  10/20/2019) Left message for pt to notify them that it is time to schedule annual low dose lung cancer screening CT scan. Will call back to verify information prior to the scan being scheduled [facility]

## 2019-10-20 NOTE — Addendum Note (Signed)
Addended by: Lieutenant Diego on: 10/20/2019 11:17 AM   Modules accepted: Orders

## 2019-10-20 NOTE — Telephone Encounter (Signed)
Contacted and scheduled, current smoker, 35.75 pack year

## 2019-10-25 ENCOUNTER — Other Ambulatory Visit: Payer: Self-pay | Admitting: Physician Assistant

## 2019-10-25 DIAGNOSIS — Z1231 Encounter for screening mammogram for malignant neoplasm of breast: Secondary | ICD-10-CM

## 2019-10-27 ENCOUNTER — Ambulatory Visit
Admission: RE | Admit: 2019-10-27 | Discharge: 2019-10-27 | Disposition: A | Discharge status: Home / Self Care | Payer: Medicare HMO | Source: Ambulatory Visit | Attending: Oncology | Admitting: Oncology

## 2019-10-27 ENCOUNTER — Other Ambulatory Visit: Payer: Self-pay

## 2019-10-27 DIAGNOSIS — Z122 Encounter for screening for malignant neoplasm of respiratory organs: Secondary | ICD-10-CM | POA: Insufficient documentation

## 2019-10-27 DIAGNOSIS — F17210 Nicotine dependence, cigarettes, uncomplicated: Secondary | ICD-10-CM | POA: Diagnosis not present

## 2019-10-27 DIAGNOSIS — Z87891 Personal history of nicotine dependence: Secondary | ICD-10-CM | POA: Insufficient documentation

## 2019-10-31 ENCOUNTER — Encounter: Payer: Self-pay | Admitting: *Deleted

## 2019-11-16 ENCOUNTER — Other Ambulatory Visit: Payer: Self-pay

## 2019-11-16 ENCOUNTER — Ambulatory Visit
Admission: RE | Admit: 2019-11-16 | Discharge: 2019-11-16 | Disposition: A | Discharge status: Home / Self Care | Payer: Medicare HMO | Source: Ambulatory Visit | Attending: Physician Assistant | Admitting: Physician Assistant

## 2019-11-16 DIAGNOSIS — Z1231 Encounter for screening mammogram for malignant neoplasm of breast: Secondary | ICD-10-CM | POA: Insufficient documentation

## 2019-11-20 ENCOUNTER — Other Ambulatory Visit: Payer: Self-pay | Admitting: Physician Assistant

## 2019-11-20 DIAGNOSIS — R928 Other abnormal and inconclusive findings on diagnostic imaging of breast: Secondary | ICD-10-CM

## 2019-11-20 DIAGNOSIS — N6489 Other specified disorders of breast: Secondary | ICD-10-CM

## 2019-12-06 ENCOUNTER — Other Ambulatory Visit: Payer: Medicare HMO

## 2019-12-06 ENCOUNTER — Ambulatory Visit: Admission: RE | Admit: 2019-12-06 | Payer: Medicare HMO | Source: Ambulatory Visit

## 2019-12-12 ENCOUNTER — Telehealth: Payer: Self-pay | Admitting: *Deleted

## 2019-12-12 NOTE — Telephone Encounter (Signed)
PT NOS AV/MAMMO APPT ON 12/06/19

## 2019-12-22 ENCOUNTER — Other Ambulatory Visit: Payer: Self-pay | Admitting: Physician Assistant

## 2019-12-22 DIAGNOSIS — M810 Age-related osteoporosis without current pathological fracture: Secondary | ICD-10-CM

## 2020-01-29 ENCOUNTER — Encounter: Payer: Medicare HMO | Admitting: Physician Assistant

## 2020-01-31 ENCOUNTER — Other Ambulatory Visit: Payer: Self-pay

## 2020-01-31 ENCOUNTER — Encounter: Payer: Self-pay | Admitting: Physician Assistant

## 2020-01-31 ENCOUNTER — Ambulatory Visit (INDEPENDENT_AMBULATORY_CARE_PROVIDER_SITE_OTHER): Payer: Medicare HMO | Admitting: Physician Assistant

## 2020-01-31 VITALS — BP 123/68 | HR 74 | Temp 98.9°F | Wt 133.2 lb

## 2020-01-31 DIAGNOSIS — Z9049 Acquired absence of other specified parts of digestive tract: Secondary | ICD-10-CM | POA: Diagnosis not present

## 2020-01-31 DIAGNOSIS — Z23 Encounter for immunization: Secondary | ICD-10-CM

## 2020-01-31 DIAGNOSIS — J439 Emphysema, unspecified: Secondary | ICD-10-CM

## 2020-01-31 DIAGNOSIS — Z0000 Encounter for general adult medical examination without abnormal findings: Secondary | ICD-10-CM

## 2020-01-31 DIAGNOSIS — F339 Major depressive disorder, recurrent, unspecified: Secondary | ICD-10-CM

## 2020-01-31 DIAGNOSIS — I10 Essential (primary) hypertension: Secondary | ICD-10-CM

## 2020-01-31 DIAGNOSIS — F1010 Alcohol abuse, uncomplicated: Secondary | ICD-10-CM | POA: Diagnosis not present

## 2020-01-31 DIAGNOSIS — E785 Hyperlipidemia, unspecified: Secondary | ICD-10-CM

## 2020-01-31 DIAGNOSIS — M720 Palmar fascial fibromatosis [Dupuytren]: Secondary | ICD-10-CM | POA: Diagnosis not present

## 2020-01-31 DIAGNOSIS — D122 Benign neoplasm of ascending colon: Secondary | ICD-10-CM | POA: Diagnosis not present

## 2020-01-31 DIAGNOSIS — M810 Age-related osteoporosis without current pathological fracture: Secondary | ICD-10-CM

## 2020-01-31 DIAGNOSIS — K1120 Sialoadenitis, unspecified: Secondary | ICD-10-CM

## 2020-01-31 NOTE — Progress Notes (Signed)
Annual Wellness Visit     Patient: Jennifer [name], Female    DOB: July 20, 1950, 70 y.o.   MRN: [id number] Visit Date: 01/31/2020  Today's Provider: Trey Sailors, PA-C   Chief Complaint  Patient presents with  . Annual Exam  I,Porsha C McClurkin,acting as a scribe for Trey Sailors, PA-C.,have documented all relevant documentation on the behalf of Trey Sailors, PA-C,as directed by  Trey Sailors, PA-C while in the presence of Trey Sailors, PA-C.  Subjective    Jennifer [name] is a 70 y.o. female who presents today for her Annual Wellness Visit. She reports consuming a general diet. The patient does not participate in regular exercise at present. She generally feels well. She reports sleeping poorly. She does have additional problems to discuss today.   HPI  Patient presenting for CPE today. She did not get mammogram as scheduled. She did not feel motivated to do so. She had right hemicolectomy 2019 due to large complex polyp. She was supposed to have follow up colonoscopy in 2020 which she did not get. She continues to smoke. She has started using alcohol again. She feels depressed. She is using Lexapro but does not feel relief from it.   Osteoporosis: continues fosamax 70 mg daily.   Depression: She reports worsening depression and motivation, has not completed routine screenings because she didn't feel motivated.   Alcoholism: She has restarted drinking.   Right hemicolectomy: History of complex and large polyps. Right hemicolectomy 2019, supposed to have 1 year follow up but did not attend.    She has right hand pain and a thick skin in her palm.   She also has right salivary gland swelling and pain that happens intermittently for 12 years. History of smoking. Reports this happens several times a year. Will be painful and then ultimately resolve.  Medications: Outpatient Medications Prior to Visit  Medication Sig  . alendronate (FOSAMAX) 70 MG tablet TAKE 1  TABLET BY MOUTH ONCE A WEEK. TAKE ON AN EMPTY STOMACH WITH A FULL GLASS OF WATER  . atorvastatin (LIPITOR) 10 MG tablet Take 1 tablet (10 mg total) by mouth daily.  Marland Kitchen escitalopram (LEXAPRO) 20 MG tablet Take 1 tablet (20 mg total) by mouth daily.  Marland Kitchen ibuprofen (ADVIL) 600 MG tablet Take 600 mg by mouth as needed.  . meloxicam (MOBIC) 7.5 MG tablet Take 1 tablet (7.5 mg total) by mouth daily. (Patient not taking: Reported on 05/15/2019)  . oxyCODONE (ROXICODONE) 5 MG immediate release tablet Take 1 tablet (5 mg total) by mouth every 8 (eight) hours as needed. (Patient not taking: Reported on 09/14/2019)  . traZODone (DESYREL) 50 MG tablet Take 0.5-1 tablets (25-50 mg total) by mouth at bedtime as needed for sleep.  . varenicline (CHANTIX STARTING MONTH PAK) 0.5 MG X 11 & 1 MG X 42 tablet 0.5 mg tablet by mouth once daily for 3 days, then 0.5 mg tablet twice daily for 4 days, then increase to one 1 mg tablet twice daily.   No facility-administered medications prior to visit.    No Known Allergies  Patient Care Team: Maryella Shivers as PCP - General (Physician Assistant) Sheets, Ivin Booty, LCSW as Counselor (Licensed Clinical Social Worker) Dingeldein, Viviann Spare, MD (Ophthalmology)  Review of Systems  Constitutional: Negative.   HENT: Negative.   Eyes: Negative.   Respiratory: Negative.   Cardiovascular: Negative.   Gastrointestinal: Negative.   Endocrine: Negative.   Genitourinary: Negative.   Musculoskeletal:  Negative.   Skin: Negative.   Allergic/Immunologic: Negative.   Neurological: Negative.   Hematological: Negative.   Psychiatric/Behavioral: Negative.       Objective    Vitals: There were no vitals taken for this visit.   Physical Exam Constitutional:      Appearance: Normal appearance.  HENT:     Right Ear: Tympanic membrane, ear canal and external ear normal.     Left Ear: Tympanic membrane, ear canal and external ear normal.  Cardiovascular:     Rate and Rhythm:  Normal rate and regular rhythm.     Pulses: Normal pulses.     Heart sounds: Normal heart sounds.  Pulmonary:     Effort: Pulmonary effort is normal.     Breath sounds: Normal breath sounds.  Abdominal:     General: Abdomen is flat. Bowel sounds are normal.     Palpations: Abdomen is soft.  Musculoskeletal:     Right hand: Normal.     Comments: Thickened band of skin on right palm.   Skin:    General: Skin is warm and dry.  Neurological:     General: No focal deficit present.     Mental Status: She is alert and oriented to person, place, and time.  Psychiatric:        Mood and Affect: Mood normal.        Behavior: Behavior normal.      Most recent functional status assessment: In your present state of health, do you have any difficulty performing the following activities: 09/14/2019  Hearing? N  Vision? N  Difficulty concentrating or making decisions? N  Walking or climbing stairs? N  Dressing or bathing? Y  Comment Currently due to a broken wrist.  Doing errands, shopping? N  Preparing Food and eating ? N  Using the Toilet? N  In the past six months, have you accidently leaked urine? N  Do you have problems with loss of bowel control? N  Managing your Medications? N  Managing your Finances? N  Housekeeping or managing your Housekeeping? N  Some recent data might be hidden   Most recent fall risk assessment: Fall Risk  09/14/2019  Falls in the past year? 1  Number falls in past yr: 0  Injury with Fall? 1  Comment Broke the left wrist.  Follow up Falls prevention discussed    Most recent depression screenings: PHQ 2/9 Scores 09/14/2019 05/15/2019  PHQ - 2 Score 0 1  PHQ- 9 Score - 4   Most recent cognitive screening: No flowsheet data found. Most recent Audit-C alcohol use screening Alcohol Use Disorder Test (AUDIT) 09/14/2019  1. How often do you have a drink containing alcohol? 4  2. How many drinks containing alcohol do you have on a typical day when you are  drinking? 0  3. How often do you have six or more drinks on one occasion? 0  AUDIT-C Score 4  4. How often during the last year have you found that you were not able to stop drinking once you had started? 0  5. How often during the last year have you failed to do what was normally expected from you because of drinking? 0  6. How often during the last year have you needed a first drink in the morning to get yourself going after a heavy drinking session? 0  7. How often during the last year have you had a feeling of guilt of remorse after drinking? 0  8. How often  during the last year have you been unable to remember what happened the night before because you had been drinking? 0  9. Have you or someone else been injured as a result of your drinking? 0  10. Has a relative or friend or a doctor or another health worker been concerned about your drinking or suggested you cut down? 0  Alcohol Use Disorder Identification Test Final Score (AUDIT) 4  Alcohol Brief Interventions/Follow-up AUDIT Score <7 follow-up not indicated   A score of 3 or more in women, and 4 or more in men indicates increased risk for alcohol abuse, EXCEPT if all of the points are from question 1   No results found for any visits on 01/31/20.  Assessment & Plan     Annual wellness visit done today including the all of the following: Reviewed patient's Family Medical History Reviewed and updated list of patient's medical providers Assessment of cognitive impairment was done Assessed patient's functional ability Established a written schedule for health screening Gregory Completed and Reviewed  Exercise Activities and Dietary recommendations Goals    . Prevent falls     Recommend to remove any items from the home that may cause slips or trips.    . Quit Smoking     Recommend to continue efforts to reduce smoking habits until no longer smoking.     . Reduce alcohol intake     Recommend to cut  back to no more than 1 serving of alcohol a day.       Immunization History  Administered Date(s) Administered  . Fluad Quad(high Dose 65+) 01/25/2019  . Influenza, High Dose Seasonal PF 11/02/2017  . Moderna Sars-Covid-2 Vaccination 02/14/2019, 03/14/2019  . Pneumococcal Conjugate-13 03/25/2017  . Pneumococcal Polysaccharide-23 01/25/2019    Health Maintenance  Topic Date Due  . COVID-19 Vaccine (3 - Moderna risk 4-dose series) 04/11/2019  . INFLUENZA VACCINE  08/27/2019  . DEXA SCAN  09/08/2019  . TETANUS/TDAP  09/13/2020 (Originally 02/06/1971)  . MAMMOGRAM  11/15/2021  . COLONOSCOPY (Pts 45-17yrs Insurance coverage will need to be confirmed)  03/30/2027  . Hepatitis C Screening  Completed  . PNA vac Low Risk Adult  Completed     Discussed health benefits of physical activity, and encouraged her to engage in regular exercise appropriate for her age and condition.    1. Annual physical exam  - TSH - Lipid panel - Comprehensive metabolic panel - CBC with Differential/Platelet  2. Adenomatous polyp of ascending colon s/p right colectomy 07/16/2017  Referral for colonoscopy.   - Ambulatory referral to Gastroenterology  3. S/P colectomy  - Ambulatory referral to Gastroenterology  4. Primary hypertension  No longer taking anti-HTN.   5. Pulmonary emphysema, unspecified emphysema type ([place])  Continues to smoke, she does not have rest symptoms.   6. Hyperlipidemia, unspecified hyperlipidemia type  Continue statin.   7. Influenza vaccination administered at current visit  - Flu Vaccine QUAD High Dose(Fluad)  8. Alcohol abuse  Started drinking again. Refer to our therapist to get her back into long term counseling.   - AMB Referral to [facility]  9. Dupuytren's contracture  Offered referral to orthopedics.   10. Sialadenitis  May be stricture vs stone vs malignancy.  - Ambulatory referral to ENT - CT Soft Tissue Neck W  Contrast  11. Osteoporosis, unspecified osteoporosis type, unspecified pathological fracture presence  - DG Bone Density; Future  12. Depression, recurrent ([place])  She wants to continue lexapro.   -  AMB Referral to [facility] Coordinaton   No follow-ups on file.     ITrinna Post, PA-C, have reviewed all documentation for this visit. The documentation on 02/07/20 for the exam, diagnosis, procedures, and orders are all accurate and complete.  The entirety of the information documented in the History of Present Illness, Review of Systems and Physical Exam were personally obtained by me. Portions of this information were initially documented by [facility] and reviewed by me for thoroughness and accuracy.     Paulene Floor  [facility] [phone] (phone) [phone] (fax)  [place]

## 2020-02-01 ENCOUNTER — Telehealth: Payer: Self-pay | Admitting: *Deleted

## 2020-02-01 LAB — LIPID PANEL
Chol/HDL Ratio: 2.2 ratio (ref 0.0–4.4)
Cholesterol, Total: 241 mg/dL — ABNORMAL HIGH (ref 100–199)
HDL: 112 mg/dL (ref >39)
LDL Chol Calc (NIH): 115 mg/dL — ABNORMAL HIGH (ref 0–99)
Triglycerides: 84 mg/dL (ref 0–149)
VLDL Cholesterol Cal: 14 mg/dL (ref 5–40)

## 2020-02-01 LAB — CBC WITH DIFFERENTIAL/PLATELET
Basophils Absolute: 0.1 10*3/uL (ref 0.0–0.2)
Basos: 1 %
EOS (ABSOLUTE): 0 10*3/uL (ref 0.0–0.4)
Eos: 1 %
Hematocrit: 40.9 % (ref 34.0–46.6)
Hemoglobin: 14.4 g/dL (ref 11.1–15.9)
Immature Grans (Abs): 0 10*3/uL (ref 0.0–0.1)
Immature Granulocytes: 0 %
Lymphocytes Absolute: 1.6 10*3/uL (ref 0.7–3.1)
Lymphs: 22 %
MCH: 32.4 pg (ref 26.6–33.0)
MCHC: 35.2 g/dL (ref 31.5–35.7)
MCV: 92 fL (ref 79–97)
Monocytes Absolute: 0.6 10*3/uL (ref 0.1–0.9)
Monocytes: 8 %
Neutrophils Absolute: 5.1 10*3/uL (ref 1.4–7.0)
Neutrophils: 68 %
Platelets: 162 10*3/uL (ref 150–450)
RBC: 4.45 x10E6/uL (ref 3.77–5.28)
RDW: 13.4 % (ref 11.7–15.4)
WBC: 7.4 10*3/uL (ref 3.4–10.8)

## 2020-02-01 LAB — COMPREHENSIVE METABOLIC PANEL
ALT: 35 IU/L — ABNORMAL HIGH (ref 0–32)
AST: 65 IU/L — ABNORMAL HIGH (ref 0–40)
Albumin/Globulin Ratio: 1.9 (ref 1.2–2.2)
Albumin: 4.4 g/dL (ref 3.8–4.8)
Alkaline Phosphatase: 86 IU/L (ref 44–121)
BUN/Creatinine Ratio: 10 — ABNORMAL LOW (ref 12–28)
BUN: 8 mg/dL (ref 8–27)
Bilirubin Total: 0.8 mg/dL (ref 0.0–1.2)
CO2: 22 mmol/L (ref 20–29)
Calcium: 9.5 mg/dL (ref 8.7–10.3)
Chloride: 102 mmol/L (ref 96–106)
Creatinine, Ser: 0.82 mg/dL (ref 0.57–1.00)
GFR calc Af Amer: 86 mL/min/{1.73_m2} (ref >59)
GFR calc non Af Amer: 74 mL/min/{1.73_m2} (ref >59)
Globulin, Total: 2.3 g/dL (ref 1.5–4.5)
Glucose: 82 mg/dL (ref 65–99)
Potassium: 3.3 mmol/L — ABNORMAL LOW (ref 3.5–5.2)
Sodium: 140 mmol/L (ref 134–144)
Total Protein: 6.7 g/dL (ref 6.0–8.5)

## 2020-02-01 LAB — TSH: TSH: 1.8 u[IU]/mL (ref 0.450–4.500)

## 2020-02-01 NOTE — Chronic Care Management (AMB) (Signed)
  Chronic Care Management   Outreach Note  02/01/2020 Name: Jennifer [name] MRN: [id number] DOB: July 16, 1950  Jennifer [name] is a 70 y.o. year old female who is a primary care patient of Trey Sailors, [place]. I reached out to Lillia Abed by phone today in response to a referral sent by Ms. Sharyne Peach Barrell's PCP, Trey Sailors, PA-C.     An unsuccessful telephone outreach was attempted today. The patient was referred to the case management team for assistance with care management and care coordination.   Follow Up Plan: A HIPAA compliant phone message was left for the patient providing contact information and requesting a return call.  The care management team will reach out to the patient again over the next 7 days. If patient returns call to provider office, please advise to call Embedded Care Management Care Guide Gwenevere Ghazi at [phone].  Gwenevere Ghazi  Care Guide, Embedded Care Coordination [facility] [place] [facility]

## 2020-02-01 NOTE — Chronic Care Management (AMB) (Signed)
  Chronic Care Management   Note  02/01/2020 Name: MATHILDA MAGUIRE MRN: [id number] DOB: 09/09/1950  CRAIG WISNEWSKI is a 70 y.o. year old female who is a primary care patient of Trinna Post, [place]. I reached out to Burnadette Peter by phone today in response to a referral sent by Ms. Edd Arbour Sullenberger's PCP, Trinna Post, PA-C.     Ms. Judice was given information about Chronic Care Management services today including:  1. CCM service includes personalized support from designated clinical staff supervised by her physician, including individualized plan of care and coordination with other care providers 2. 24/7 contact phone numbers for assistance for urgent and routine care needs. 3. Service will only be billed when office clinical staff spend 20 minutes or more in a month to coordinate care. 4. Only one practitioner may furnish and bill the service in a calendar month. 5. The patient may stop CCM services at any time (effective at the end of the month) by phone call to the office staff. 6. The patient will be responsible for cost sharing (co-pay) of up to 20% of the service fee (after annual deductible is met).  Patient agreed to services and verbal consent obtained.   Follow up plan: Telephone appointment with care management team member scheduled for:02/06/2020  [place] Management

## 2020-02-06 ENCOUNTER — Ambulatory Visit: Payer: Medicare HMO | Admitting: *Deleted

## 2020-02-06 DIAGNOSIS — F32A Depression, unspecified: Secondary | ICD-10-CM

## 2020-02-06 DIAGNOSIS — F1010 Alcohol abuse, uncomplicated: Secondary | ICD-10-CM

## 2020-02-06 DIAGNOSIS — F419 Anxiety disorder, unspecified: Secondary | ICD-10-CM

## 2020-02-06 NOTE — Patient Instructions (Addendum)
  Visit Information  Patient Care Plan: Depression (Adult)    Problem Identified: Symptoms (Depression)     Goal: Symptoms Monitored and Managed   Start Date: 02/06/2020  Expected End Date: 05/06/2020  This Visit's Progress: On track  Priority: High  Note:   Current Barriers:  Marland Kitchen Mental Health Concerns  . Lack of Knowledge of local mental health cousneling  Clinical Social Work Clinical Goal(s):  Marland Kitchen Over the next 90 days, patient will follow up with a local mental health counseling as directed by SW  Interventions: . 1:1 collaboration with Trinna Post, PA-C regarding development and update of comprehensive plan of care as evidenced by provider attestation and co-signature . Inter-disciplinary care team collaboration (see longitudinal plan of care) . Patient interviewed and appropriate assessments performed . Patient openly discussed relationship conflicts . Supportive counseling provided, positive coping and self care emphasized . Provided patient with information about local mental health therapist for ongoing follow up  Patient Goals/Self-Care Activities Over the next 90 days, patient will:   - Patient will attend all scheduled provider appointments Patient will call provider office for new concerns or questions - begin personal counseling - talk about feelings with a friend, family or spiritual advisor - practice positive thinking and self-talk  Follow up Plan: SW will follow up with patient by phone over the next 14 business days   Task: Alleviate Barriers to Depression Treatment   Due Date: 05/06/2020  Priority: Routine  Note:   Care Management Activities:    - emotional support provided - participation in mental health treatment encouraged    Notes:      Ms. Wolfer was given information about Care Management services on  02/01/20 including:  1. Care Management services include personalized support from designated clinical staff supervised by her physician,  including individualized plan of care and coordination with other care providers 2. 24/7 contact phone numbers for assistance for urgent and routine care needs. 3. The patient may stop CCM services at any time (effective at the end of the month) by phone call to the office staff.  Patient agreed to services and verbal consent obtained.   The patient verbalized understanding of instructions, educational materials, and care plan provided today and declined offer to receive copy of patient instructions, educational materials, and care plan.   Telephone follow up appointment with care management team member scheduled for:02/20/20   Elliot Gurney, [place] Worker  [facility] Care Management [phone]

## 2020-02-06 NOTE — Chronic Care Management (AMB) (Signed)
Chronic Care Management    Clinical Social Work Note  02/06/2020 Name: Jennifer [name] MRN: [id number] DOB: [birth year]  Jennifer [name] is a 70 y.o. year old female who is a primary care patient of Jennifer [name], [place]. The CCM team was consulted to assist the patient with chronic disease management and/or care coordination needs related to: Mental Health Counseling and Resources.   Engaged with patient by telephone for initial visit in response to provider referral for social work chronic care management and care coordination services.   Consent to Services:  The patient was given information about Chronic Care Management services, agreed to services, and gave verbal consent prior to initiation of services.  Please see initial visit note for detailed documentation.   Patient agreed to services and consent obtained.   Assessment/Interventions: Review of patient past medical history, allergies, medications, and health status, including review of relevant consultants reports was performed today as part of a comprehensive evaluation and provision of chronic care management and care coordination services.     SDOH (Social Determinants of Health) assessments and interventions performed:    Advanced Directives Status: Not addressed in this encounter.  CCM Care Plan  No Known Allergies  Outpatient Encounter Medications as of 02/06/2020  Medication Sig  . alendronate (FOSAMAX) 70 MG tablet TAKE 1 TABLET BY MOUTH ONCE A WEEK. TAKE ON AN EMPTY STOMACH WITH A FULL GLASS OF WATER  . atorvastatin (LIPITOR) 10 MG tablet Take 1 tablet (10 mg total) by mouth daily.  Marland Kitchen escitalopram (LEXAPRO) 20 MG tablet Take 1 tablet (20 mg total) by mouth daily.  Marland Kitchen ibuprofen (ADVIL) 600 MG tablet Take 600 mg by mouth as needed.  . traZODone (DESYREL) 50 MG tablet Take 0.5-1 tablets (25-50 mg total) by mouth at bedtime as needed for sleep.  . varenicline (CHANTIX STARTING MONTH PAK) 0.5 MG X 11 & 1 MG X 42 tablet 0.5 mg  tablet by mouth once daily for 3 days, then 0.5 mg tablet twice daily for 4 days, then increase to one 1 mg tablet twice daily.   No facility-administered encounter medications on file as of 02/06/2020.    Patient Active Problem List   Diagnosis Date Noted  . Alcohol abuse, episodic drinking behavior 08/25/2018  . Aortic atherosclerosis ([place]) 07/19/2018  . Hypertension 04/07/2018  . Osteoporosis 01/03/2018  . COPD (chronic obstructive pulmonary disease) ([place]) 11/02/2017  . Umbilical hernia s/p primary repair 07/16/2017 07/17/2017  . Hyperlipidemia 07/17/2017  . Arthritis   . Adenomatous polyp of ascending colon s/p right colectomy 07/16/2017 07/16/2017  . Genetic testing 05/13/2017  . History of colonic polyps 05/03/2017  . Family history of colon cancer   . Special screening for malignant neoplasms, colon   . Tobacco abuse 03/10/2017    Conditions to be addressed/monitored: ; Mental Health Concerns   Patient Care Plan: Depression (Adult)    Problem Identified: Symptoms (Depression)     Goal: Symptoms Monitored and Managed   Start Date: 02/06/2020  Expected End Date: 05/06/2020  This Visit's Progress: On track  Priority: High  Note:   Current Barriers:  Marland Kitchen Mental Health Concerns  . Lack of Knowledge of local mental health cousneling  Clinical Social Work Clinical Goal(s):  Marland Kitchen Over the next 90 days, patient will follow up with a local mental health counseling as directed by SW  Interventions: . 1:1 collaboration with Jennifer Post, PA-C regarding development and update of comprehensive plan of care as evidenced by provider attestation and  co-signature . Inter-disciplinary care team collaboration (see longitudinal plan of care) . Patient interviewed and appropriate assessments performed . Patient openly discussed relationship conflicts . Supportive counseling provided, positive coping and self care emphasized . Provided patient with information about local mental health  therapist for ongoing follow up  Patient Goals/Self-Care Activities Over the next 90 days, patient will:   - Patient will attend all scheduled provider appointments Patient will call provider office for new concerns or questions - begin personal counseling - talk about feelings with a friend, family or spiritual advisor - practice positive thinking and self-talk  Follow up Plan: SW will follow up with patient by phone over the next 14 business days   Task: Alleviate Barriers to Depression Treatment   Due Date: 05/06/2020  Priority: Routine  Note:   Care Management Activities:    - emotional support provided - participation in mental health treatment encouraged    Notes:       Follow Up Plan: SW will follow up with patient by phone over the next 14 business days       [place], [place] Worker  [facility] Care Management [phone]

## 2020-02-07 ENCOUNTER — Telehealth (INDEPENDENT_AMBULATORY_CARE_PROVIDER_SITE_OTHER): Payer: Self-pay | Admitting: Gastroenterology

## 2020-02-07 ENCOUNTER — Other Ambulatory Visit: Payer: Self-pay

## 2020-02-07 DIAGNOSIS — D122 Benign neoplasm of ascending colon: Secondary | ICD-10-CM

## 2020-02-07 MED ORDER — NA SULFATE-K SULFATE-MG SULF 17.5-3.13-1.6 GM/177ML PO SOLN: 1.0000 | Freq: Once | ORAL | 0 refills | Status: AC

## 2020-02-07 NOTE — Progress Notes (Signed)
Gastroenterology Pre-Procedure Review  Request Date: 03/04/20 Requesting Physician: Dr. Marius Ditch  PATIENT REVIEW QUESTIONS: The patient responded to the following health history questions as indicated:    1. Are you having any GI issues? yes (pt states she has no control over her bowels) 2. Do you have a personal history of Polyps? yes (04/08/17 performed by Dr. Marius Ditch) 3. Do you have a family history of Colon Cancer or Polyps? yes 4. Diabetes Mellitus? no 5. Joint replacements in the past 12 months?no 6. Major health problems in the past 3 months?no 7. Any artificial heart valves, MVP, or defibrillator?no    MEDICATIONS & ALLERGIES:    Patient reports the following regarding taking any anticoagulation/antiplatelet therapy:   Plavix, Coumadin, Eliquis, Xarelto, Lovenox, Pradaxa, Brilinta, or Effient? no Aspirin? no  Patient confirms/reports the following medications:  Current Outpatient Medications  Medication Sig Dispense Refill  . alendronate (FOSAMAX) 70 MG tablet TAKE 1 TABLET BY MOUTH ONCE A WEEK. TAKE ON AN EMPTY STOMACH WITH A FULL GLASS OF WATER 12 tablet 0  . atorvastatin (LIPITOR) 10 MG tablet Take 1 tablet (10 mg total) by mouth daily. 90 tablet 1  . escitalopram (LEXAPRO) 20 MG tablet Take 1 tablet (20 mg total) by mouth daily. 90 tablet 1  . ibuprofen (ADVIL) 600 MG tablet Take 600 mg by mouth as needed. (Patient not taking: Reported on 02/07/2020)    . traZODone (DESYREL) 50 MG tablet Take 0.5-1 tablets (25-50 mg total) by mouth at bedtime as needed for sleep. (Patient not taking: Reported on 02/07/2020) 30 tablet 0  . varenicline (CHANTIX STARTING MONTH PAK) 0.5 MG X 11 & 1 MG X 42 tablet 0.5 mg tablet by mouth once daily for 3 days, then 0.5 mg tablet twice daily for 4 days, then increase to one 1 mg tablet twice daily. (Patient not taking: Reported on 02/07/2020) 53 tablet 0   No current facility-administered medications for this visit.    Patient confirms/reports the  following allergies:  No Known Allergies  No orders of the defined types were placed in this encounter.   AUTHORIZATION INFORMATION Primary Insurance: 1D#: Group #:  Secondary Insurance: 1D#: Group #:  SCHEDULE INFORMATION: Date: 03/04/20 Time: Location:[facility]

## 2020-02-09 ENCOUNTER — Telehealth: Payer: Medicare HMO | Admitting: Gastroenterology

## 2020-02-09 ENCOUNTER — Encounter: Payer: Self-pay | Admitting: *Deleted

## 2020-02-09 ENCOUNTER — Other Ambulatory Visit: Payer: Self-pay

## 2020-02-20 ENCOUNTER — Telehealth: Payer: Medicare HMO | Admitting: *Deleted

## 2020-02-20 ENCOUNTER — Telehealth: Payer: Self-pay | Admitting: *Deleted

## 2020-02-20 NOTE — Telephone Encounter (Signed)
   Chronic Care Management   Unsuccessful Call Note 02/20/2020 Name: EVANIA LYNE MRN: [id number] DOB: 1950/05/19  Machaela Caterino  is a 70 year old female who sees Carles Collet, [place] for primary care. Carles Collet, PA-C asked the CCM team to consult the patient for Mental Health Counseling and Resources.    This social worker was unable to reach patient via telephone today for follow up call. Her VM was full, I was not able to leave a HIPAA compliant voicemail asking patient to return my call. (unsuccessful outreach #1).   Plan: Will follow-up within 7-14 business days via telephone.     Signature

## 2020-02-21 ENCOUNTER — Telehealth (INDEPENDENT_AMBULATORY_CARE_PROVIDER_SITE_OTHER): Payer: Medicare HMO | Admitting: Gastroenterology

## 2020-02-21 ENCOUNTER — Encounter: Payer: Self-pay | Admitting: Gastroenterology

## 2020-02-21 DIAGNOSIS — D122 Benign neoplasm of ascending colon: Secondary | ICD-10-CM | POA: Diagnosis not present

## 2020-02-21 DIAGNOSIS — R7989 Other specified abnormal findings of blood chemistry: Secondary | ICD-10-CM | POA: Diagnosis not present

## 2020-02-21 DIAGNOSIS — F1010 Alcohol abuse, uncomplicated: Secondary | ICD-10-CM

## 2020-02-21 DIAGNOSIS — K9089 Other intestinal malabsorption: Secondary | ICD-10-CM

## 2020-02-21 MED ORDER — CHOLESTYRAMINE 4 G PO PACK: 4.0000 g | PACK | Freq: Two times a day (BID) | ORAL | 1 refills | Status: DC

## 2020-02-21 NOTE — Progress Notes (Signed)
Sherri Sear, MD [address]  [place]  [place], [place] [address]  Main: [phone]  Fax: [phone]    Gastroenterology Consultation Tele Visit  Referring Provider:     Paulene Floor Primary Care Physician:  Paulene Floor Primary Gastroenterologist:  Dr. Cephas Darby Reason for Consultation:     Chronic diarrhea        HPI:   Jennifer [name] is a 69 y.o. female referred by Dr. Trinna Post, PA-C  for consultation & management of Chronic diarrhea  Virtual Visit via Telephone Note  I connected with CALLAHAN PEDDIE on 02/21/20 at  1:15 PM EST by telephone and verified that I am speaking with the correct person using two identifiers.   I discussed the limitations, risks, security and privacy concerns of performing an evaluation and management service by telephone and the availability of in person appointments. I also discussed with the patient that there may be a patient responsible charge related to this service. The patient expressed understanding and agreed to proceed.  Location of the Patient: Home  Location of the provider: Office  Persons participating in the visit: Patient and provider only  History of Present Illness: 70 year old female with history of chronic tobacco use, periodic alcohol abuse, history of multiple colon polyps, right hemicolectomy secondary to unresectable large ascending colon polyp in 06/2017.  Patient reports that since her right hemicolectomy, she has been experiencing nonbloody diarrhea, anywhere from 2-4 bowel movements daily, watery, sometimes associated with incontinence.  She denies abdominal pain, abdominal bloating or cramps.  She reports losing about 30 pounds within last 1 year.  She is single, lives alone, picked up on drinking, acknowledges drinking about 1 pint of liquor weekly.  Her transaminases are elevated based on most recent labs.  She reports her appetite is good.  She has not tried any over-the-counter  medications for diarrhea.  No evidence of anemia.   NSAIDs: None  Antiplts/Anticoagulants/Anti thrombotics: None  GI Procedures:  Colonoscopy 03/29/2017 - One 7 mm polyp in the cecum, removed with a hot snare. Resected and retrieved. - One 10 mm polyp in the cecum, removed with mucosal resection. Resected and retrieved. Clip was placed. - One 10 mm polyp in the proximal ascending colon, removed with mucosal resection. Resected and retrieved. - Two 5 mm polyps in the ascending colon, removed with a cold snare. Resected and retrieved. - One 12 mm polyp in the mid ascending colon, removed with mucosal resection. Incomplete resection. Resected tissue retrieved. Clip was placed. - One 25 mm polyp in the ascending colon. Resection not attempted. Tattooed. - One 8 mm polyp in the transverse colon, removed with a hot snare. Resected and retrieved. - One 10 mm polyp in the descending colon, removed with mucosal resection. Resected and retrieved. Clip was placed. - One 8 mm polyp in the sigmoid colon, removed with a hot snare. Resected and retrieved. - The distal rectum and anal verge are normal on retroflexion view.  Pathology DIAGNOSIS:  A. COLON POLYP X 2, CECUM; HOT SNARE:  - TUBULAR ADENOMAS, 3 FRAGMENTS.  - NEGATIVE FOR HIGH-GRADE DYSPLASIA AND MALIGNANCY.   B. COLON POLYP X 3, ASCENDING; HOT SNARE (1) AND COLD SNARE (2):  - TUBULAR ADENOMAS, AT LEAST 6 FRAGMENTS.  - NEGATIVE FOR HIGH-GRADE DYSPLASIA AND MALIGNANCY.   C. COLD POLYP, ASCENDING; HOT SNARE:  - TUBULAR ADENOMA AND BLOOD CLOT.  - NEGATIVE FOR HIGH-GRADE DYSPLASIA AND MALIGNANCY.   D.  COLON POLYP, TRANSVERSE; HOT SNARE:  - TUBULAR ADENOMA, 2 FRAGMENTS.  - NEGATIVE FOR HIGH-GRADE DYSPLASIA AND MALIGNANCY.   E. COLON POLYP, DESCENDING; HOT SNARE:  - TUBULAR ADENOMA, 4 FRAGMENTS.  - NEGATIVE FOR HIGH-GRADE DYSPLASIA AND MALIGNANCY.   F. COLON POLYP, SIGMOID; HOT SNARE:  - TUBULAR ADENOMA, MULTIPLE FRAGMENTS.  -  NEGATIVE FOR HIGH-GRADE DYSPLASIA AND MALIGNANCY.   Past Medical History:  Diagnosis Date  . Arthritis    mild  . Cancer (HCC)    skin cancer leg and chest  . Dysrhythmia    palpitation  . Family history of colon cancer   . Headache    as  a child   . Heart murmur    while pregnant  . Hyperlipidemia 07/17/2017  . Tobacco abuse     Past Surgical History:  Procedure Laterality Date  . ABDOMINAL HYSTERECTOMY     1996  . COLONOSCOPY WITH PROPOFOL N/A 03/29/2017   Procedure: COLONOSCOPY WITH PROPOFOL;  Surgeon: Lin Landsman, MD;  Location: [facility] ENDOSCOPY;  Service: Gastroenterology;  Laterality: N/A;  . HEMICOLECTOMY     righ  t6-21-19  . LAPAROSCOPIC RIGHT HEMI COLECTOMY Right 07/16/2017   Procedure: LAPAROSCOPIC RIGHT HEMI COLECTOMY;  Surgeon: Ileana Roup, MD;  Location: [facility] ORS;  Service: General;  Laterality: Right;  . TUBAL LIGATION      Current Outpatient Medications:  .  alendronate (FOSAMAX) 70 MG tablet, TAKE 1 TABLET BY MOUTH ONCE A WEEK. TAKE ON AN EMPTY STOMACH WITH A FULL GLASS OF WATER, Disp: 12 tablet, Rfl: 0 .  atorvastatin (LIPITOR) 10 MG tablet, Take 1 tablet (10 mg total) by mouth daily., Disp: 90 tablet, Rfl: 1 .  cholestyramine (QUESTRAN) 4 g packet, Take 1 packet (4 g total) by mouth 2 (two) times daily., Disp: 60 each, Rfl: 1 .  escitalopram (LEXAPRO) 20 MG tablet, Take 1 tablet (20 mg total) by mouth daily., Disp: 90 tablet, Rfl: 1 .  ibuprofen (ADVIL) 600 MG tablet, Take 600 mg by mouth as needed., Disp: , Rfl:  .  traZODone (DESYREL) 50 MG tablet, Take 0.5-1 tablets (25-50 mg total) by mouth at bedtime as needed for sleep. (Patient not taking: Reported on 02/21/2020), Disp: 30 tablet, Rfl: 0   Family History  Problem Relation Age of Onset  . Diabetes Mother   . Kidney disease Mother   . Heart attack Father   . Diabetes Maternal Grandmother   . Colon cancer Maternal Grandfather   . Heart disease Paternal Grandmother   . Colon cancer  Paternal Uncle        dx >50, colon completely removed  . Colon cancer Paternal Aunt 62  . Breast cancer Neg Hx      Social History   Tobacco Use  . Smoking status: Current Every Day Smoker    Packs/day: 0.75    Years: 46.35    Pack years: 34.76    Types: Cigarettes  . Smokeless tobacco: Never Used  . Tobacco comment: Currently on Chantix  Vaping Use  . Vaping Use: Never used  Substance Use Topics  . Alcohol use: Yes    Alcohol/week: 14.0 standard drinks    Types: 14 Shots of liquor per week    Comment: "Im an alcoholic"   . Drug use: No    Allergies as of 02/21/2020  . (No Known Allergies)    Imaging Studies: No abdominal imaging  Assessment and Plan:   JEANICE PACCIONE is a 70 y.o. female with history  of chronic tobacco use, alcohol abuse, history of unresectable ascending colon polyp s/p right hemicolectomy in 06/2017 had a phone visit for chronic diarrhea  Chronic nonbloody diarrhea with weight loss Since this has started after right hemicolectomy, will try bile acid sequestrant such as cholestyramine or colestipol 1-2 times daily.  If her diarrhea is persistent, will pursue further work-up Patient is scheduled to undergo colonoscopy on 2/7, will perform random colon biopsies as well  Elevated LFTs, AST greater than ALT, likely secondary to alcohol use HIV and HCV negative Strongly advised to quit drinking alcohol Recheck LFTs in 2 to 3 months   Follow Up Instructions:   I discussed the assessment and treatment plan with the patient. The patient was provided an opportunity to ask questions and all were answered. The patient agreed with the plan and demonstrated an understanding of the instructions.   The patient was advised to call back or seek an in-person evaluation if the symptoms worsen or if the condition fails to improve as anticipated.  I provided 25 minutes of non-face-to-face time during this encounter.   Follow up in 2 months   Cephas Darby, MD

## 2020-02-22 ENCOUNTER — Other Ambulatory Visit: Payer: Self-pay

## 2020-02-22 ENCOUNTER — Ambulatory Visit
Admission: RE | Admit: 2020-02-22 | Discharge: 2020-02-22 | Disposition: A | Discharge status: Home / Self Care | Payer: Medicare HMO | Source: Ambulatory Visit | Attending: Physician Assistant | Admitting: Physician Assistant

## 2020-02-22 DIAGNOSIS — K1120 Sialoadenitis, unspecified: Secondary | ICD-10-CM | POA: Diagnosis not present

## 2020-02-22 DIAGNOSIS — I6529 Occlusion and stenosis of unspecified carotid artery: Secondary | ICD-10-CM | POA: Diagnosis not present

## 2020-02-22 DIAGNOSIS — R221 Localized swelling, mass and lump, neck: Secondary | ICD-10-CM | POA: Diagnosis not present

## 2020-02-22 DIAGNOSIS — K118 Other diseases of salivary glands: Secondary | ICD-10-CM | POA: Diagnosis not present

## 2020-02-22 MED ORDER — IOHEXOL 300 MG/ML  SOLN
75.0000 mL | Freq: Once | INTRAMUSCULAR | Status: AC | PRN
  Administered 2020-02-22: 75 mL via INTRAVENOUS

## 2020-02-23 ENCOUNTER — Telehealth: Payer: Medicare HMO | Admitting: *Deleted

## 2020-02-23 ENCOUNTER — Telehealth: Payer: Self-pay | Admitting: *Deleted

## 2020-02-23 NOTE — Telephone Encounter (Signed)
   Chronic Care Management   Unsuccessful Call Note 02/23/2020 Name: Jennifer [name] MRN: [id number] DOB: [birth year]  Jennifer [name] is a 70 year old female who sees Carles Collet, [place] for primary care. Carles Collet, PA-C asked the CCM team to consult the patient for Mental Health Counseling and Resources.     This social worker was unable to reach patient via telephone today for follow up call. I was not able to leave a HIPAA compliant voicemail asking patient to return my call as the voicemail was gull. (unsuccessful outreach #2).   Plan: Will follow-up within 7 business days via telephone.      Elliot Gurney, [place] Worker  [facility] Care Management [phone]

## 2020-02-28 ENCOUNTER — Ambulatory Visit (INDEPENDENT_AMBULATORY_CARE_PROVIDER_SITE_OTHER): Payer: Medicare HMO | Admitting: *Deleted

## 2020-02-28 DIAGNOSIS — F339 Major depressive disorder, recurrent, unspecified: Secondary | ICD-10-CM | POA: Diagnosis not present

## 2020-02-28 DIAGNOSIS — F419 Anxiety disorder, unspecified: Secondary | ICD-10-CM | POA: Diagnosis not present

## 2020-02-28 DIAGNOSIS — F32A Depression, unspecified: Secondary | ICD-10-CM

## 2020-02-29 ENCOUNTER — Other Ambulatory Visit: Payer: Self-pay

## 2020-02-29 ENCOUNTER — Telehealth: Payer: Medicare HMO

## 2020-02-29 ENCOUNTER — Other Ambulatory Visit
Admission: RE | Admit: 2020-02-29 | Discharge: 2020-02-29 | Disposition: A | Discharge status: Home / Self Care | Payer: Medicare HMO | Source: Ambulatory Visit | Attending: Gastroenterology | Admitting: Gastroenterology

## 2020-02-29 DIAGNOSIS — Z01812 Encounter for preprocedural laboratory examination: Secondary | ICD-10-CM | POA: Diagnosis not present

## 2020-02-29 DIAGNOSIS — Z20822 Contact with and (suspected) exposure to covid-19: Secondary | ICD-10-CM | POA: Insufficient documentation

## 2020-02-29 NOTE — Chronic Care Management (AMB) (Signed)
Chronic Care Management    Clinical Social Work Note  02/29/2020 Name: Jennifer [name] MRN: [id number] DOB: 10-22-1950  Jennifer [name] is a 70 y.o. year old female who is a primary care patient of Trinna Post, [place]. The CCM team was consulted to assist the patient with chronic disease management and/or care coordination needs related to: Mental Health Counseling and Resources.   Engaged with patient by telephone for follow up visit in response to provider referral for social work chronic care management and care coordination services.   Consent to Services:  The patient was given information about Chronic Care Management services, agreed to services, and gave verbal consent prior to initiation of services.  Please see initial visit note for detailed documentation.   Patient agreed to services and consent obtained.   Assessment: Review of patient past medical history, allergies, medications, and health status, including review of relevant consultants reports was performed today as part of a comprehensive evaluation and provision of chronic care management and care coordination services.     SDOH (Social Determinants of Health) assessments and interventions performed:    Advanced Directives Status: Not addressed in this encounter.  CCM Care Plan  No Known Allergies  Outpatient Encounter Medications as of 02/28/2020  Medication Sig  . alendronate (FOSAMAX) 70 MG tablet TAKE 1 TABLET BY MOUTH ONCE A WEEK. TAKE ON AN EMPTY STOMACH WITH A FULL GLASS OF WATER  . atorvastatin (LIPITOR) 10 MG tablet Take 1 tablet (10 mg total) by mouth daily.  . cholestyramine (QUESTRAN) 4 g packet Take 1 packet (4 g total) by mouth 2 (two) times daily.  Marland Kitchen escitalopram (LEXAPRO) 20 MG tablet Take 1 tablet (20 mg total) by mouth daily.  Marland Kitchen ibuprofen (ADVIL) 600 MG tablet Take 600 mg by mouth as needed.  . traZODone (DESYREL) 50 MG tablet Take 0.5-1 tablets (25-50 mg total) by mouth at bedtime as needed for sleep.  (Patient not taking: Reported on 02/21/2020)   No facility-administered encounter medications on file as of 02/28/2020.    Patient Active Problem List   Diagnosis Date Noted  . Alcohol abuse, episodic drinking behavior 08/25/2018  . Aortic atherosclerosis ([place]) 07/19/2018  . Hypertension 04/07/2018  . Osteoporosis 01/03/2018  . COPD (chronic obstructive pulmonary disease) ([place]) 11/02/2017  . Umbilical hernia s/p primary repair 07/16/2017 07/17/2017  . Hyperlipidemia 07/17/2017  . Arthritis   . Adenomatous polyp of ascending colon s/p right colectomy 07/16/2017 07/16/2017  . Genetic testing 05/13/2017  . History of colonic polyps 05/03/2017  . Family history of colon cancer   . Special screening for malignant neoplasms, colon   . Tobacco abuse 03/10/2017    Conditions to be addressed/monitored: Depression; Mental Health Concerns   Care Plan : Depression (Adult)  Updates made by Vern Claude, LCSW since 02/29/2020 12:00 AM    Problem: Symptoms (Depression)     Goal: Symptoms Monitored and Managed   Start Date: 02/06/2020  Expected End Date: 05/06/2020  Recent Progress: On track  Priority: High  Note:   Current Barriers:  Marland Kitchen Mental Health Concerns  . Lack of Knowledge of local mental health cousneling  Clinical Social Work Clinical Goal(s):  Marland Kitchen Over the next 90 days, patient will follow up with a local mental health counseling as directed by SW  Interventions: . 1:1 collaboration with Trinna Post, PA-C regarding development and update of comprehensive plan of care as evidenced by provider attestation and co-signature . Inter-disciplinary care team collaboration (see longitudinal plan  of care) . Patient interviewed and appropriate assessments performed . Patient continues to openly discuss relationship conflicts and challenges with letting go . Discussed additional losses in her life admitted to recent relapse . Supportive counseling provided, positive coping and self care  emphasized . Provided patient with information about local mental health therapist for ongoing follow up . Recommended obtaining a sponsor and following up with AA  Patient Goals/Self-Care Activities Over the next 90 days, patient will:   - Patient will attend all scheduled provider appointments Patient will call provider office for new concerns or questions - begin personal counseling - talk about feelings with a friend, family or spiritual advisor - practice positive thinking and self-talk  Follow up Plan: SW will follow up with patient by phone over the next 14 business days      Follow Up Plan: SW will follow up with patient by phone over the next 7-10 business days       [place], [place] Worker  [place] Care Management [phone]

## 2020-02-29 NOTE — Patient Instructions (Signed)
Visit Information  Goals Addressed            This Visit's Progress   . Manage My Emotions       Timeframe:  Long-Range Goal Priority:  High Start Date:     02/06/20                        Expected End Date: 05/06/20                      Follow Up Date 03/13/2020   - begin personal counseling - talk about feelings with a friend, family or spiritual advisor - practice positive thinking and self-talk    Why is this important?    When you are stressed, down or upset, your body reacts too.   For example, your blood pressure may get higher; you may have a headache or stomachache.   When your emotions get the best of you, your body's ability to fight off cold and flu gets weak.   These steps will help you manage your emotions.     Notes:        The patient verbalized understanding of instructions, educational materials, and care plan provided today and declined offer to receive copy of patient instructions, educational materials, and care plan.   Telephone follow up appointment with care management team member scheduled for: 03/12/20

## 2020-03-01 ENCOUNTER — Telehealth: Payer: Medicare HMO

## 2020-03-01 ENCOUNTER — Encounter: Payer: Self-pay | Admitting: Gastroenterology

## 2020-03-01 LAB — SARS CORONAVIRUS 2 (TAT 6-24 HRS): SARS Coronavirus 2: NEGATIVE

## 2020-03-04 ENCOUNTER — Ambulatory Visit: Payer: Medicare HMO | Admitting: Anesthesiology

## 2020-03-04 ENCOUNTER — Encounter: Payer: Self-pay | Admitting: Gastroenterology

## 2020-03-04 ENCOUNTER — Encounter
Admission: RE | Disposition: A | Discharge status: Home / Self Care | Payer: Self-pay | Source: Home / Self Care | Attending: Gastroenterology

## 2020-03-04 ENCOUNTER — Ambulatory Visit
Admission: RE | Admit: 2020-03-04 | Discharge: 2020-03-04 | Disposition: A | Discharge status: Home / Self Care | Payer: Medicare HMO | Attending: Gastroenterology | Admitting: Gastroenterology

## 2020-03-04 ENCOUNTER — Other Ambulatory Visit: Payer: Self-pay

## 2020-03-04 DIAGNOSIS — Z79899 Other long term (current) drug therapy: Secondary | ICD-10-CM | POA: Diagnosis not present

## 2020-03-04 DIAGNOSIS — K644 Residual hemorrhoidal skin tags: Secondary | ICD-10-CM | POA: Diagnosis not present

## 2020-03-04 DIAGNOSIS — D122 Benign neoplasm of ascending colon: Secondary | ICD-10-CM | POA: Diagnosis not present

## 2020-03-04 DIAGNOSIS — E785 Hyperlipidemia, unspecified: Secondary | ICD-10-CM | POA: Insufficient documentation

## 2020-03-04 DIAGNOSIS — D123 Benign neoplasm of transverse colon: Secondary | ICD-10-CM | POA: Diagnosis not present

## 2020-03-04 DIAGNOSIS — L918 Other hypertrophic disorders of the skin: Secondary | ICD-10-CM | POA: Diagnosis not present

## 2020-03-04 DIAGNOSIS — Z7983 Long term (current) use of bisphosphonates: Secondary | ICD-10-CM | POA: Diagnosis not present

## 2020-03-04 DIAGNOSIS — J449 Chronic obstructive pulmonary disease, unspecified: Secondary | ICD-10-CM | POA: Diagnosis not present

## 2020-03-04 DIAGNOSIS — Z86010 Personal history of colonic polyps: Secondary | ICD-10-CM | POA: Diagnosis not present

## 2020-03-04 DIAGNOSIS — Z9049 Acquired absence of other specified parts of digestive tract: Secondary | ICD-10-CM | POA: Diagnosis not present

## 2020-03-04 DIAGNOSIS — Z08 Encounter for follow-up examination after completed treatment for malignant neoplasm: Secondary | ICD-10-CM | POA: Insufficient documentation

## 2020-03-04 DIAGNOSIS — Z85038 Personal history of other malignant neoplasm of large intestine: Secondary | ICD-10-CM | POA: Diagnosis not present

## 2020-03-04 DIAGNOSIS — K635 Polyp of colon: Secondary | ICD-10-CM

## 2020-03-04 DIAGNOSIS — Z980 Intestinal bypass and anastomosis status: Secondary | ICD-10-CM | POA: Insufficient documentation

## 2020-03-04 DIAGNOSIS — Z85828 Personal history of other malignant neoplasm of skin: Secondary | ICD-10-CM | POA: Insufficient documentation

## 2020-03-04 DIAGNOSIS — F17210 Nicotine dependence, cigarettes, uncomplicated: Secondary | ICD-10-CM | POA: Insufficient documentation

## 2020-03-04 DIAGNOSIS — Z90710 Acquired absence of both cervix and uterus: Secondary | ICD-10-CM | POA: Insufficient documentation

## 2020-03-04 DIAGNOSIS — Z800 Family history of malignant neoplasm of digestive organs: Secondary | ICD-10-CM | POA: Insufficient documentation

## 2020-03-04 DIAGNOSIS — Z1211 Encounter for screening for malignant neoplasm of colon: Secondary | ICD-10-CM | POA: Diagnosis not present

## 2020-03-04 DIAGNOSIS — I10 Essential (primary) hypertension: Secondary | ICD-10-CM | POA: Diagnosis not present

## 2020-03-04 HISTORY — PX: COLONOSCOPY WITH PROPOFOL: SHX5780

## 2020-03-04 SURGERY — COLONOSCOPY WITH PROPOFOL
Anesthesia: General

## 2020-03-04 MED ORDER — PROPOFOL 500 MG/50ML IV EMUL
INTRAVENOUS | Status: AC
  Filled 2020-03-04: qty 50

## 2020-03-04 MED ORDER — LIDOCAINE HCL (CARDIAC) PF 100 MG/5ML IV SOSY
PREFILLED_SYRINGE | INTRAVENOUS | Status: DC | PRN
  Administered 2020-03-04: 50 mg via INTRATRACHEAL

## 2020-03-04 MED ORDER — PROPOFOL 10 MG/ML IV BOLUS
INTRAVENOUS | Status: DC | PRN
  Administered 2020-03-04 (×2): 40 mg via INTRAVENOUS
  Administered 2020-03-04: 60 mg via INTRAVENOUS
  Administered 2020-03-04: 40 mg via INTRAVENOUS

## 2020-03-04 MED ORDER — PROPOFOL 500 MG/50ML IV EMUL
INTRAVENOUS | Status: DC | PRN
  Administered 2020-03-04: 150 ug/kg/min via INTRAVENOUS

## 2020-03-04 MED ORDER — SODIUM CHLORIDE 0.9 % IV SOLN: INTRAVENOUS | Status: DC

## 2020-03-04 NOTE — Anesthesia Postprocedure Evaluation (Signed)
Anesthesia Post Note  Patient: Jennifer [name]  Procedure(s) Performed: COLONOSCOPY WITH PROPOFOL (N/A )  Patient location during evaluation: Endoscopy Anesthesia Type: General Level of consciousness: awake and alert Pain management: pain level controlled Vital Signs Assessment: post-procedure vital signs reviewed and stable Respiratory status: spontaneous breathing, nonlabored ventilation, respiratory function stable and patient connected to nasal cannula oxygen Cardiovascular status: blood pressure returned to baseline and stable Postop Assessment: no apparent nausea or vomiting Anesthetic complications: no   No complications documented.   Last Vitals:  Vitals:   03/04/20 1133 03/04/20 1143  BP: 133/60 130/71  Pulse: 87 73  Resp:    Temp:    SpO2: 100% 100%    Last Pain:  Vitals:   03/04/20 1143  TempSrc:   PainSc: 0-No pain                 Arita Miss

## 2020-03-04 NOTE — H&P (Signed)
Jennifer Darby, MD [address]  [place]  [place], [place] [address]  Main: [phone]  Fax: [phone] Pager: [phone]  Primary Care Physician:  Trinna Post, PA-C Primary Gastroenterologist:  Dr. Cephas [name]  Pre-Procedure History & Physical: HPI:  Jennifer [name] is a 70 y.o. female is here for an colonoscopy.   Past Medical History:  Diagnosis Date  . Arthritis    mild  . Cancer (HCC)    skin cancer leg and chest  . Dysrhythmia    palpitation  . Family history of colon cancer   . Headache    as  a child   . Heart murmur    while pregnant  . Hyperlipidemia 07/17/2017  . Tobacco abuse     Past Surgical History:  Procedure Laterality Date  . ABDOMINAL HYSTERECTOMY     1996  . COLONOSCOPY WITH PROPOFOL N/A 03/29/2017   Procedure: COLONOSCOPY WITH PROPOFOL;  Surgeon: Lin Landsman, MD;  Location: [facility] ENDOSCOPY;  Service: Gastroenterology;  Laterality: N/A;  . HEMICOLECTOMY     righ  t6-21-19  . LAPAROSCOPIC RIGHT HEMI COLECTOMY Right 07/16/2017   Procedure: LAPAROSCOPIC RIGHT HEMI COLECTOMY;  Surgeon: Ileana Roup, MD;  Location: [facility] ORS;  Service: General;  Laterality: Right;  . TUBAL LIGATION      Prior to Admission medications   Medication Sig Start Date End Date Taking? Authorizing Provider  atorvastatin (LIPITOR) 10 MG tablet Take 1 tablet (10 mg total) by mouth daily. 05/15/19  Yes Carles Collet M, PA-C  cholestyramine (QUESTRAN) 4 g packet Take 1 packet (4 g total) by mouth 2 (two) times daily. 02/21/20 03/22/20 Yes Delania Ferg, Tally Due, MD  escitalopram (LEXAPRO) 20 MG tablet Take 1 tablet (20 mg total) by mouth daily. 05/15/19  Yes Trinna Post, PA-C  alendronate (FOSAMAX) 70 MG tablet TAKE 1 TABLET BY MOUTH ONCE A WEEK. TAKE ON AN EMPTY STOMACH WITH A FULL GLASS OF WATER 12/22/19   Trinna Post, PA-C  ibuprofen (ADVIL) 600 MG tablet Take 600 mg by mouth as needed. 09/07/19   [provider]  traZODone (DESYREL)  50 MG tablet Take 0.5-1 tablets (25-50 mg total) by mouth at bedtime as needed for sleep. Patient not taking: Reported on 02/21/2020 01/25/19   Trinna Post, PA-C    Allergies as of 02/07/2020  . (No Known Allergies)    Family History  Problem Relation Age of Onset  . Diabetes Mother   . Kidney disease Mother   . Heart attack Father   . Diabetes Maternal Grandmother   . Colon cancer Maternal Grandfather   . Heart disease Paternal Grandmother   . Colon cancer Paternal Uncle        dx >50, colon completely removed  . Colon cancer Paternal Aunt 3  . Breast cancer Neg Hx     Social History   Socioeconomic History  . Marital status: Divorced    Spouse name: Not on file  . Number of children: 1  . Years of education: Not on file  . Highest education level: 10th grade  Occupational History  . Occupation: retired  Tobacco Use  . Smoking status: Current Every Day Smoker    Packs/day: 0.75    Years: 46.35    Pack years: 34.76    Types: Cigarettes  . Smokeless tobacco: Never Used  . Tobacco comment: Currently on Chantix  Vaping Use  . Vaping Use: Never used  Substance and Sexual Activity  .  Alcohol use: Yes    Alcohol/week: 14.0 standard drinks    Types: 14 Shots of liquor per week    Comment: "Im an alcoholic"   . Drug use: No  . Sexual activity: Not Currently  Other Topics Concern  . Not on file  Social History Narrative   1 son deceased   Social Determinants of Health   Financial Resource Strain: Low Risk   . Difficulty of Paying Living Expenses: Not hard at all  Food Insecurity: No Food Insecurity  . Worried About Charity fundraiser in the Last Year: Never true  . Ran Out of Food in the Last Year: Never true  Transportation Needs: No Transportation Needs  . Lack of Transportation (Medical): No  . Lack of Transportation (Non-Medical): No  Physical Activity: Inactive  . Days of Exercise per Week: 0 days  . Minutes of Exercise per Session: 0 min  Stress:  Stress Concern Present  . Feeling of Stress : Very much  Social Connections: Socially Isolated  . Frequency of Communication with Friends and Family: More than three times a week  . Frequency of Social Gatherings with Friends and Family: More than three times a week  . Attends Religious Services: Never  . Active Member of Clubs or Organizations: No  . Attends Archivist Meetings: Never  . Marital Status: Divorced  Human resources officer Violence: Not At Risk  . Fear of Current or Ex-Partner: No  . Emotionally Abused: No  . Physically Abused: No  . Sexually Abused: No    Review of Systems: See HPI, otherwise negative ROS  Physical Exam: BP 133/88   Pulse 94   Temp 97.6 F (36.4 C) (Temporal)   Resp 16   Ht 5\' 10"  (1.778 m)   Wt 60.8 kg   SpO2 98%   BMI 19.23 kg/m  General:   Alert,  pleasant and cooperative in NAD Head:  Normocephalic and atraumatic. Neck:  Supple; no masses or thyromegaly. Lungs:  Clear throughout to auscultation.    Heart:  Regular rate and rhythm. Abdomen:  Soft, nontender and nondistended. Normal bowel sounds, without guarding, and without rebound.   Neurologic:  Alert and  oriented x4;  grossly normal neurologically.  Impression/Plan: Jennifer [name] is here for an colonoscopy to be performed for h/o colon cancer  Risks, benefits, limitations, and alternatives regarding  colonoscopy have been reviewed with the patient.  Questions have been answered.  All parties agreeable.   Sherri Sear, MD  03/04/2020, 10:07 AM

## 2020-03-04 NOTE — Op Note (Signed)
[facility] Gastroenterology Patient Name: Jennifer [name] Procedure Date: 03/04/2020 9:01 AM MRN: [id number] Account #: [id number] Date of Birth: Jun 22, 1950 Admit Type: Outpatient Age: 70 Room: [facility] ENDO ROOM 1 Gender: Female Note Status: Finalized Procedure:             Colonoscopy Indications:           High risk colon cancer surveillance: Personal history                         of colon cancer, Last colonoscopy: March 2019 Providers:             Lin Landsman MD, MD Medicines:             General Anesthesia Complications:         No immediate complications. Estimated blood loss: None. Procedure:             Pre-Anesthesia Assessment:                        - Prior to the procedure, a History and Physical was                         performed, and patient medications and allergies were                         reviewed. The patient is competent. The risks and                         benefits of the procedure and the sedation options and                         risks were discussed with the patient. All questions                         were answered and informed consent was obtained.                         Patient identification and proposed procedure were                         verified by the physician, the nurse, the                         anesthesiologist, the anesthetist and the technician                         in the pre-procedure area in the procedure room in the                         endoscopy suite. Mental Status Examination: alert and                         oriented. Airway Examination: normal oropharyngeal                         airway and neck mobility. Respiratory Examination:                         clear to auscultation. CV  Examination: normal.                         Prophylactic Antibiotics: The patient does not require                         prophylactic antibiotics. Prior Anticoagulants: The                         patient has taken  no previous anticoagulant or                         antiplatelet agents. ASA Grade Assessment: III - A                         patient with severe systemic disease. After reviewing                         the risks and benefits, the patient was deemed in                         satisfactory condition to undergo the procedure. The                         anesthesia plan was to use general anesthesia.                         Immediately prior to administration of medications,                         the patient was re-assessed for adequacy to receive                         sedatives. The heart rate, respiratory rate, oxygen                         saturations, blood pressure, adequacy of pulmonary                         ventilation, and response to care were monitored                         throughout the procedure. The physical status of the                         patient was re-assessed after the procedure.                        After obtaining informed consent, the colonoscope was                         passed under direct vision. Throughout the procedure,                         the patient's blood pressure, pulse, and oxygen                         saturations were monitored continuously. The  Colonoscope was introduced through the anus and                         advanced to the the ileocolonic anastomosis. The                         colonoscopy was performed with difficulty due to                         significant looping. Successful completion of the                         procedure was aided by applying abdominal pressure.                         The patient tolerated the procedure well. The quality                         of the bowel preparation was evaluated using the BBPS                         [facility] Bowel Preparation Scale) with scores of: Right                         Colon = 3, Transverse Colon = 3 and Left Colon = 3                          (entire mucosa seen well with no residual staining,                         small fragments of stool or opaque liquid). The total                         BBPS score equals 9. Findings:      Skin tags were found on perianal exam.      There was evidence of a prior end-to-side ileo-colonic anastomosis in       the transverse colon. This was patent and was characterized by healthy       appearing mucosa. The anastomosis was traversed.      A 5 mm polyp was found in the transverse colon. The polyp was sessile.       The polyp was removed with a cold snare. Resection and retrieval were       complete.      Non-bleeding external hemorrhoids were found during retroflexion. The       hemorrhoids were medium-sized.      The neo-terminal ileum appeared normal.      Normal mucosa was found in the entire colon. Biopsies for histology were       taken with a cold forceps from the entire colon for evaluation of       microscopic colitis. Impression:            - Perianal skin tags found on perianal exam.                        - Patent end-to-side ileo-colonic anastomosis,  characterized by healthy appearing mucosa.                        - One 5 mm polyp in the transverse colon, removed with                         a cold snare. Resected and retrieved.                        - Non-bleeding external hemorrhoids.                        - The examined portion of the ileum was normal.                        - Normal mucosa in the entire examined colon. Biopsied. Recommendation:        - Discharge patient to home (with escort).                        - Resume previous diet today.                        - Continue present medications.                        - Await pathology results.                        - Return to my office as previously scheduled.                        - Repeat colonoscopy in 5 years for surveillance. Procedure Code(s):     --- Professional ---                         [phone], Colonoscopy, flexible; with removal of                         tumor(s), polyp(s), or other lesion(s) by snare                         technique                        45380, 52, Colonoscopy, flexible; with biopsy, single                         or multiple Diagnosis Code(s):     --- Professional ---                        K64.4, Residual hemorrhoidal skin tags                        Z85.038, Personal history of other malignant neoplasm                         of large intestine                        Z98.0, Intestinal bypass and anastomosis status  K63.5, Polyp of colon CPT copyright 2019 American Medical Association. All rights reserved. The codes documented in this report are preliminary and upon coder review may  be revised to meet current compliance requirements. Dr. Ulyess Mort Lin Landsman MD, MD 03/04/2020 11:23:20 AM This report has been signed electronically. Number of Addenda: 0 Note Initiated On: 03/04/2020 9:01 AM Scope Withdrawal Time: 0 hours 13 minutes 25 seconds  Total Procedure Duration: 0 hours 19 minutes 40 seconds  Estimated Blood Loss:  Estimated blood loss: none.      [facility]

## 2020-03-04 NOTE — Anesthesia Preprocedure Evaluation (Signed)
Anesthesia Evaluation  Patient identified by MRN, date of birth, ID band Patient awake    Reviewed: Allergy & Precautions, NPO status , Patient's Chart, lab work & pertinent test results  History of Anesthesia Complications Negative for: history of anesthetic complications  Airway Mallampati: II  TM Distance: >3 FB Neck ROM: Full    Dental  (+) Dental Advisory Given, Poor Dentition, Missing, Loose,    Pulmonary neg sleep apnea, COPD,  COPD inhaler, Current SmokerPatient did not abstain from smoking.,    Pulmonary exam normal breath sounds clear to auscultation       Cardiovascular Exercise Tolerance: Good METShypertension, (-) CAD and (-) Past MI Normal cardiovascular exam+ dysrhythmias (Palpitations)  Rhythm:Regular Rate:Normal     Neuro/Psych  Headaches, negative psych ROS   GI/Hepatic negative GI ROS, Neg liver ROS, neg GERD  ,Colon polyp   Endo/Other  negative endocrine ROSneg diabetes  Renal/GU negative Renal ROS     Musculoskeletal  (+) Arthritis ,   Abdominal   Peds  Hematology negative hematology ROS (+)   Anesthesia Other Findings Past Medical History: No date: Arthritis     Comment:  mild No date: Cancer (HCC)     Comment:  skin cancer leg and chest No date: Dysrhythmia     Comment:  palpitation No date: Family history of colon cancer No date: Headache     Comment:  as  a child  No date: Heart murmur     Comment:  while pregnant 07/17/2017: Hyperlipidemia No date: Tobacco abuse  Reproductive/Obstetrics                             Anesthesia Physical  Anesthesia Plan  ASA: III  Anesthesia Plan: General   Post-op Pain Management:    Induction: Intravenous  PONV Risk Score and Plan: 2 and Ondansetron, Propofol infusion and TIVA  Airway Management Planned: Natural Airway  Additional Equipment: None  Intra-op Plan:   Post-operative Plan: Extubation in  OR  Informed Consent: I have reviewed the patients History and Physical, chart, labs and discussed the procedure including the risks, benefits and alternatives for the proposed anesthesia with the patient or authorized representative who has indicated his/her understanding and acceptance.     Dental advisory given  Plan Discussed with: CRNA  Anesthesia Plan Comments: (Discussed risks of anesthesia with patient, including possibility of difficulty with spontaneous ventilation under anesthesia necessitating airway intervention, PONV, and rare risks such as cardiac or respiratory or neurological events. Patient understands. Patient counseled on benefits of smoking cessation, and increased perioperative risks associated with continued smoking. )        Anesthesia Quick Evaluation

## 2020-03-04 NOTE — Transfer of Care (Signed)
Immediate Anesthesia Transfer of Care Note  Patient: Jennifer [name]  Procedure(s) Performed: COLONOSCOPY WITH PROPOFOL (N/A )  Patient Location: PACU  Anesthesia Type:General  Level of Consciousness: sedated  Airway & Oxygen Therapy: Patient Spontanous Breathing and Patient connected to nasal cannula oxygen  Post-op Assessment: Report given to RN and Post -op Vital signs reviewed and stable  Post vital signs: Reviewed and stable  Last Vitals:  Vitals Value Taken Time  BP 119/68 03/04/20 1123  Temp    Pulse 72 03/04/20 1125  Resp 18 03/04/20 1125  SpO2 98 % 03/04/20 1125  Vitals shown include unvalidated device data.  Last Pain:  Vitals:   03/04/20 1123  TempSrc:   PainSc: 0-No pain         Complications: No complications documented.

## 2020-03-05 ENCOUNTER — Encounter: Payer: Self-pay | Admitting: Gastroenterology

## 2020-03-05 LAB — SURGICAL PATHOLOGY

## 2020-03-11 ENCOUNTER — Telehealth: Payer: Self-pay

## 2020-03-11 ENCOUNTER — Other Ambulatory Visit: Payer: Self-pay

## 2020-03-11 ENCOUNTER — Ambulatory Visit
Admission: RE | Admit: 2020-03-11 | Discharge: 2020-03-11 | Disposition: A | Discharge status: Home / Self Care | Payer: Medicare HMO | Source: Ambulatory Visit | Attending: Physician Assistant | Admitting: Physician Assistant

## 2020-03-11 DIAGNOSIS — M8588 Other specified disorders of bone density and structure, other site: Secondary | ICD-10-CM | POA: Diagnosis not present

## 2020-03-11 DIAGNOSIS — Z780 Asymptomatic menopausal state: Secondary | ICD-10-CM | POA: Diagnosis not present

## 2020-03-11 DIAGNOSIS — M810 Age-related osteoporosis without current pathological fracture: Secondary | ICD-10-CM | POA: Diagnosis not present

## 2020-03-11 NOTE — Telephone Encounter (Signed)
Referral placed.

## 2020-03-11 NOTE — Telephone Encounter (Signed)
-----   Message from Trinna Post, [place] sent at 03/11/2020  2:33 PM EST ----- Can we let patient know that her bone density scan for osteoporosis does not look improved, despite two years of first line therapy. Would recommend she see endocrinology to consider second line medications which are generally injections. Please let us know if she would like that and we can place referral.

## 2020-03-11 NOTE — Telephone Encounter (Signed)
Patient was advised and states a referral is fine to the endocrinology.

## 2020-03-12 ENCOUNTER — Telehealth: Payer: Self-pay | Admitting: *Deleted

## 2020-03-12 ENCOUNTER — Telehealth: Payer: Medicare HMO | Admitting: *Deleted

## 2020-03-12 NOTE — Telephone Encounter (Signed)
   Chronic Care Management   Unsuccessful Call Note 03/12/2020 Name: Jennifer [name] MRN: [id number] DOB: April 17, 1950  [name] is a 70 year old female who sees Carles Collet, [place] for primary care. Carles Collet, PA-c asked the CCM team to consult the patient for Mental Health Counseling and Resources.     This Education officer, museum sas unable to reach patient via telephone today for follow up call. I was not able to leave a HIPAA compliant voicemail asking patient to return my call-VM was full. (unsuccessful outreach #2).   Plan: Will follow-up within 7 business days via telephone.      Elliot Gurney, [place] Worker  [facility] [phone]

## 2020-03-13 ENCOUNTER — Ambulatory Visit: Payer: Medicare HMO | Admitting: *Deleted

## 2020-03-13 DIAGNOSIS — F419 Anxiety disorder, unspecified: Secondary | ICD-10-CM | POA: Diagnosis not present

## 2020-03-13 DIAGNOSIS — F32A Depression, unspecified: Secondary | ICD-10-CM

## 2020-03-13 DIAGNOSIS — F1010 Alcohol abuse, uncomplicated: Secondary | ICD-10-CM

## 2020-03-13 DIAGNOSIS — F339 Major depressive disorder, recurrent, unspecified: Secondary | ICD-10-CM

## 2020-03-13 NOTE — Chronic Care Management (AMB) (Signed)
Chronic Care Management    Clinical Social Work Note  03/13/2020 Name: Jennifer [name] MRN: [id number] DOB: 01-Feb-1950  Jennifer [name] is a 70 y.o. year old female who is a primary care patient of Trinna Post, [place]. The CCM team was consulted to assist the patient with chronic disease management and/or care coordination needs related to: Mental Health Counseling and Resources.   Engaged with patient by telephone for follow up visit in response to provider referral for social work chronic care management and care coordination services.   Consent to Services:  The patient was given information about Chronic Care Management services, agreed to services, and gave verbal consent prior to initiation of services.  Please see initial visit note for detailed documentation.   Patient agreed to services and consent obtained.   Assessment: Review of patient past medical history, allergies, medications, and health status, including review of relevant consultants reports was performed today as part of a comprehensive evaluation and provision of chronic care management and care coordination services.     SDOH (Social Determinants of Health) assessments and interventions performed:    Advanced Directives Status: Not addressed in this encounter.  CCM Care Plan  No Known Allergies  Outpatient Encounter Medications as of 03/13/2020  Medication Sig  . alendronate (FOSAMAX) 70 MG tablet TAKE 1 TABLET BY MOUTH ONCE A WEEK. TAKE ON AN EMPTY STOMACH WITH A FULL GLASS OF WATER  . atorvastatin (LIPITOR) 10 MG tablet Take 1 tablet (10 mg total) by mouth daily.  . cholestyramine (QUESTRAN) 4 g packet Take 1 packet (4 g total) by mouth 2 (two) times daily.  Marland Kitchen escitalopram (LEXAPRO) 20 MG tablet Take 1 tablet (20 mg total) by mouth daily.  Marland Kitchen ibuprofen (ADVIL) 600 MG tablet Take 600 mg by mouth as needed.  . traZODone (DESYREL) 50 MG tablet Take 0.5-1 tablets (25-50 mg total) by mouth at bedtime as needed for  sleep. (Patient not taking: Reported on 02/21/2020)   No facility-administered encounter medications on file as of 03/13/2020.    Patient Active Problem List   Diagnosis Date Noted  . Alcohol abuse, episodic drinking behavior 08/25/2018  . Aortic atherosclerosis ([place]) 07/19/2018  . Hypertension 04/07/2018  . Osteoporosis 01/03/2018  . COPD (chronic obstructive pulmonary disease) ([place]) 11/02/2017  . Umbilical hernia s/p primary repair 07/16/2017 07/17/2017  . Hyperlipidemia 07/17/2017  . Arthritis   . Adenomatous polyp of ascending colon s/p right colectomy 07/16/2017 07/16/2017  . Genetic testing 05/13/2017  . History of colonic polyps 05/03/2017  . Family history of colon cancer   . Special screening for malignant neoplasms, colon   . Tobacco abuse 03/10/2017    Conditions to be addressed/monitored: Depression; Mental Health Concerns   Care Plan : Depression (Adult)  Updates made by Vern Claude, LCSW since 03/13/2020 12:00 AM    Problem: Symptoms (Depression)     Goal: Symptoms Monitored and Managed   Start Date: 02/06/2020  Expected End Date: 05/06/2020  Recent Progress: On track  Priority: High  Note:   Current Barriers:  Marland Kitchen Mental Health Concerns  . Lack of Knowledge of local mental health cousneling  Clinical Social Work Clinical Goal(s):  Marland Kitchen Over the next 90 days, patient will follow up with a local mental health counseling as directed by SW  Interventions: . 1:1 collaboration with Trinna Post, PA-C regarding development and update of comprehensive plan of care as evidenced by provider attestation and co-signature . Inter-disciplinary care team collaboration (see longitudinal plan  of care) . Patient interviewed and appropriate assessments performed . Patient continues to openly discuss relationship conflicts and challenges with letting go . Supportive counseling provided, positive coping and self care emphasized . Continued to recommend follow up with AA and  possibly getting sponsor due to recent relapse .   Patient Goals/Self-Care Activities Over the next 90 days, patient will:   - Patient will attend all scheduled provider appointments Patient will call provider office for new concerns or questions - begin personal counseling - talk about feelings with a friend, family or spiritual advisor - practice positive thinking and self-talk  Follow up Plan: SW will follow up with patient by phone over the next 14 business days      Follow Up Plan: SW will follow up with patient by phone over the next 7-14 business days       [place], [place] Worker  [place] [facility] Management [phone]

## 2020-03-13 NOTE — Patient Instructions (Signed)
Visit Information  PATIENT GOALS:  Goals Addressed            This Visit's Progress   . Manage My Emotions       Timeframe:  Long-Range Goal Priority:  High Start Date:     02/06/20                        Expected End Date: 05/06/20                      Follow Up Date 03/13/2020   - begin personal counseling - talk about feelings with a friend, family or spiritual advisor - practice positive thinking and self-talk  -consider AA and obtaining a sponsor   Why is this important?    When you are stressed, down or upset, your body reacts too.   For example, your blood pressure may get higher; you may have a headache or stomachache.   When your emotions get the best of you, your body's ability to fight off cold and flu gets weak.   These steps will help you manage your emotions.     Notes:        The patient verbalized understanding of instructions, educational materials, and care plan provided today and declined offer to receive copy of patient instructions, educational materials, and care plan.   Telephone follow up appointment with care management team member scheduled for: 03/27/20   Elliot Gurney, [place] Worker  [facility] [facility] [phone]

## 2020-04-02 ENCOUNTER — Telehealth: Payer: Medicare HMO

## 2020-04-17 ENCOUNTER — Other Ambulatory Visit: Payer: Self-pay

## 2020-04-17 ENCOUNTER — Ambulatory Visit (INDEPENDENT_AMBULATORY_CARE_PROVIDER_SITE_OTHER): Payer: Medicare HMO | Admitting: Gastroenterology

## 2020-04-17 ENCOUNTER — Encounter: Payer: Self-pay | Admitting: Gastroenterology

## 2020-04-17 ENCOUNTER — Telehealth: Payer: Medicare HMO | Admitting: *Deleted

## 2020-04-17 ENCOUNTER — Telehealth: Payer: Self-pay | Admitting: *Deleted

## 2020-04-17 VITALS — BP 116/73 | HR 65 | Temp 98.3°F | Ht 70.0 in | Wt 135.2 lb

## 2020-04-17 DIAGNOSIS — R7989 Other specified abnormal findings of blood chemistry: Secondary | ICD-10-CM | POA: Diagnosis not present

## 2020-04-17 DIAGNOSIS — K529 Noninfective gastroenteritis and colitis, unspecified: Secondary | ICD-10-CM | POA: Diagnosis not present

## 2020-04-17 NOTE — Telephone Encounter (Signed)
  Chronic Care Management   Outreach Note  04/17/2020 Name: Jennifer [name] MRN: [id number] DOB: 23-May-1950  Referred by: Trinna Post, PA-C Reason for referral : Chronic Care Management   An unsuccessful telephone outreach was attempted today. The patient was referred to the case management team for assistance with care management and care coordination.   Follow Up Plan: Telephone follow up appointment with care management team member will be re-scheduled   Elliot Gurney, [place] Worker  [facility] [phone]

## 2020-04-17 NOTE — Progress Notes (Signed)
Jennifer Darby, MD [address]  [place]  [place], [name] [address]  Main: [phone]  Fax: [phone] Pager: [phone]   Primary Care Physician: Trinna Post, PA-C  Primary Gastroenterologist:  Dr. Cephas [name]  Chief Complaint  Patient presents with  . Diarrhea    Is not having any symptoms     HPI: Jennifer [name] is a 70 y.o. female Chronic tobacco use,was initially seen by me on 03/29/2017 for screening colonoscopy. She did not have colonoscopy before. She underwent complete colonoscopy, and found to have multiple polyps. Pathology came back as adenomas with no evidence of high-grade dysplasia or cancer.there were 2 polyps in the ascending colon, one 12 mm flat polyp which was incompletely removed after lifting. Another 25 mm flat polyp in ascending colon in close proximity to the 12 mm polyp was found, resection not attempted, area distal to the polyp was tattooed. She is here to discuss about the pathology results from recent colonoscopy. She is otherwise doing well. Denies any GI complaints today.    Follow-up visit 02/21/2020 70 year old female with history of chronic tobacco use, periodic alcohol abuse, history of multiple colon polyps, right hemicolectomy secondary to unresectable large ascending colon polyp in 06/2017.  Patient reports that since her right hemicolectomy, she has been experiencing nonbloody diarrhea, anywhere from 2-4 bowel movements daily, watery, sometimes associated with incontinence.  She denies abdominal pain, abdominal bloating or cramps.  She reports losing about 30 pounds within last 1 year.  She is single, lives alone, picked up on drinking, acknowledges drinking about 1 pint of liquor weekly.  Her transaminases are elevated based on most recent labs.  She reports her appetite is good.  She has not tried any over-the-counter medications for diarrhea.  No evidence of anemia.  Follow-up visit 04/17/2020 Patient reports that her  diarrhea is intermittent.  However, continues to have postprandial urgency as well as bloating.  She is not able to gain weight.  She continues to smoke 1 pack/day.  She quit drinking alcohol.  Her weight is stable  Current Outpatient Medications  Medication Sig Dispense Refill  . atorvastatin (LIPITOR) 10 MG tablet Take 1 tablet (10 mg total) by mouth daily. 90 tablet 1  . alendronate (FOSAMAX) 70 MG tablet TAKE 1 TABLET BY MOUTH ONCE A WEEK. TAKE ON AN EMPTY STOMACH WITH A FULL GLASS OF WATER (Patient not taking: Reported on 04/17/2020) 12 tablet 0  . cholestyramine (QUESTRAN) 4 g packet Take 1 packet (4 g total) by mouth 2 (two) times daily. (Patient not taking: Reported on 04/17/2020) 60 each 1  . escitalopram (LEXAPRO) 20 MG tablet Take 1 tablet (20 mg total) by mouth daily. (Patient not taking: Reported on 04/17/2020) 90 tablet 1  . ibuprofen (ADVIL) 600 MG tablet Take 600 mg by mouth as needed. (Patient not taking: Reported on 04/17/2020)    . traZODone (DESYREL) 50 MG tablet Take 0.5-1 tablets (25-50 mg total) by mouth at bedtime as needed for sleep. (Patient not taking: No sig reported) 30 tablet 0   No current facility-administered medications for this visit.    Allergies as of 04/17/2020  . (No Known Allergies)    NSAIDs: none  Antiplts/Anticoagulants/Anti thrombotics: none  GI procedures:  Colonoscopy 03/04/2020 - Perianal skin tags found on perianal exam. - Patent end-to-side ileo-colonic anastomosis, characterized by healthy appearing mucosa. - One 5 mm polyp in the transverse colon, removed with a cold snare. Resected and retrieved. - Non-bleeding external hemorrhoids. -  The examined portion of the ileum was normal. - Normal mucosa in the entire examined colon. Biopsied.  DIAGNOSIS:  A. RANDOM, COLON; COLD BIOPSY:  - COLONIC MUCOSA WITH NO SIGNIFICANT PATHOLOGIC ALTERATION.  - NEGATIVE FOR MICROSCOPIC COLITIS, DYSPLASIA, AND MALIGNANCY.   B. COLON POLYP X1, TRANSVERSE;  COLD SNARE:  - TUBULAR ADENOMA.  - NEGATIVE FOR HIGH-GRADE DYSPLASIA AND MALIGNANCY.   Colonoscopy 03/29/2017 - One 7 mm polyp in the cecum, removed with a hot snare. Resected and retrieved. - One 10 mm polyp in the cecum, removed with mucosal resection. Resected and retrieved. Clip was placed. - One 10 mm polyp in the proximal ascending colon, removed with mucosal resection. Resected and retrieved. - Two 5 mm polyps in the ascending colon, removed with a cold snare. Resected and retrieved. - One 12 mm polyp in the mid ascending colon, removed with mucosal resection. Incomplete resection. Resected tissue retrieved. Clip was placed. - One 25 mm polyp in the ascending colon. Resection not attempted. Tattooed. - One 8 mm polyp in the transverse colon, removed with a hot snare. Resected and retrieved. - One 10 mm polyp in the descending colon, removed with mucosal resection. Resected and retrieved. Clip was placed. - One 8 mm polyp in the sigmoid colon, removed with a hot snare. Resected and retrieved. - The distal rectum and anal verge are normal on retroflexion view.  Pathology DIAGNOSIS:  A. COLON POLYP X 2, CECUM; HOT SNARE:  - TUBULAR ADENOMAS, 3 FRAGMENTS.  - NEGATIVE FOR HIGH-GRADE DYSPLASIA AND MALIGNANCY.   B. COLON POLYP X 3, ASCENDING; HOT SNARE (1) AND COLD SNARE (2):  - TUBULAR ADENOMAS, AT LEAST 6 FRAGMENTS.  - NEGATIVE FOR HIGH-GRADE DYSPLASIA AND MALIGNANCY.   C. COLD POLYP, ASCENDING; HOT SNARE:  - TUBULAR ADENOMA AND BLOOD CLOT.  - NEGATIVE FOR HIGH-GRADE DYSPLASIA AND MALIGNANCY.   D. COLON POLYP, TRANSVERSE; HOT SNARE:  - TUBULAR ADENOMA, 2 FRAGMENTS.  - NEGATIVE FOR HIGH-GRADE DYSPLASIA AND MALIGNANCY.   E. COLON POLYP, DESCENDING; HOT SNARE:  - TUBULAR ADENOMA, 4 FRAGMENTS.  - NEGATIVE FOR HIGH-GRADE DYSPLASIA AND MALIGNANCY.   F. COLON POLYP, SIGMOID; HOT SNARE:  - TUBULAR ADENOMA, MULTIPLE FRAGMENTS.  - NEGATIVE FOR HIGH-GRADE DYSPLASIA AND MALIGNANCY.    She has family history of colon cancer in her grandmother, aunt and uncle  ROS:  General: Negative for anorexia, weight loss, fever, chills, fatigue, weakness. ENT: Negative for hoarseness, difficulty swallowing , nasal congestion. CV: Negative for chest pain, angina, palpitations, dyspnea on exertion, peripheral edema.  Respiratory: Negative for dyspnea at rest, dyspnea on exertion, cough, sputum, wheezing.  GI: See history of present illness. GU:  Negative for dysuria, hematuria, urinary incontinence, urinary frequency, nocturnal urination.  Endo: Negative for unusual weight change.    Physical Examination:   BP 116/73 (BP Location: Left Arm, Patient Position: Sitting, Cuff Size: Normal)   Pulse 65   Temp 98.3 F (36.8 C) (Oral)   Ht 5\' 10"  (1.778 m)   Wt 135 lb 4 oz (61.3 kg)   BMI 19.41 kg/m   General: Well-nourished, well-developed in no acute distress.  Eyes: No icterus. Conjunctivae pink. Mouth: Oropharyngeal mucosa moist and pink , no lesions erythema or exudate. Lungs: Clear to auscultation bilaterally. Non-labored. Heart: Regular rate and rhythm, no murmurs rubs or gallops.  Abdomen: Bowel sounds are normal, nontender, mildly distended, tympanic to percussion, no hepatosplenomegaly or masses, no hernia , no rebound or guarding.   Extremities: No lower extremity edema. No clubbing or  deformities. Neuro: Alert and oriented x 3.  Grossly intact. Skin: Warm and dry, no jaundice.   Psych: Alert and cooperative, normal mood and affect.   Imaging Studies: No results found.  Assessment and Plan:   Jennifer [name] is a 70 y.o. female with family history of colon cancer in multiple second-degree relatives, with history of chronic tobacco use, alcohol abuse, history of unresectable ascending colon polyp s/p right hemicolectomy in 06/2017, is here for follow-up of chronic nonbloody diarrhea with weight loss that has started after right hemicolectomy.  Patient underwent  colonoscopy in 02/2020, random colon biopsies were performed, no evidence of microscopic colitis  Chronic diarrhea: Currently improved Patient is no longer taking bile acid sequestrant With postprandial urgency and bloating and chronic tobacco use, recommend to check pancreatic fecal elastase levels to evaluate for exocrine pancreatic insufficiency  Elevated LFTs, AST greater than ALT, likely secondary to alcohol use Patient reports that she quit drinking alcohol HIV and HCV negative Recheck LFTs today   Follow up in 4 months   Dr Sherri Sear, MD

## 2020-04-18 LAB — HEPATIC FUNCTION PANEL
ALT: 15 IU/L (ref 0–32)
AST: 21 IU/L (ref 0–40)
Albumin: 4.2 g/dL (ref 3.8–4.8)
Alkaline Phosphatase: 71 IU/L (ref 44–121)
Bilirubin Total: <0.2 mg/dL (ref 0.0–1.2)
Bilirubin, Direct: <0.1 mg/dL (ref 0.00–0.40)
Total Protein: 6.3 g/dL (ref 6.0–8.5)

## 2020-04-24 ENCOUNTER — Ambulatory Visit (INDEPENDENT_AMBULATORY_CARE_PROVIDER_SITE_OTHER): Payer: Medicare HMO | Admitting: *Deleted

## 2020-04-24 DIAGNOSIS — F339 Major depressive disorder, recurrent, unspecified: Secondary | ICD-10-CM

## 2020-04-24 DIAGNOSIS — F32A Depression, unspecified: Secondary | ICD-10-CM | POA: Diagnosis not present

## 2020-04-24 DIAGNOSIS — F419 Anxiety disorder, unspecified: Secondary | ICD-10-CM | POA: Diagnosis not present

## 2020-04-24 NOTE — Chronic Care Management (AMB) (Deleted)
Care Management Clinical Social Work Note  04/24/2020 Name: Jennifer [name] MRN: [id number] DOB: 1950/03/19  Jennifer [name] is a 70 y.o. year old female who is a primary care patient of Trinna Post, [place].  The Care Management team was consulted for assistance with chronic disease management and coordination needs.  {CCMTELEPHONEFACETOFACE:21091510} for {CCMINITIALFOLLOWUPCHOICE:21091511} in response to provider referral for social work chronic care management and care coordination services  Consent to Services:  Ms. Tejera was given information about Care Management services today including:  1. Care Management services includes personalized support from designated clinical staff supervised by her physician, including individualized plan of care and coordination with other care providers 2. 24/7 contact phone numbers for assistance for urgent and routine care needs. 3. The patient may stop case management services at any time by phone call to the office staff.  Patient agreed to services and consent obtained.   Assessment: Review of patient past medical history, allergies, medications, and health status, including review of relevant consultants reports was performed today as part of a comprehensive evaluation and provision of chronic care management and care coordination services.  SDOH (Social Determinants of Health) assessments and interventions performed:    Advanced Directives Status: {Advanced Directives Status:25048}  Care Plan  No Known Allergies  Outpatient Encounter Medications as of 04/24/2020  Medication Sig  . alendronate (FOSAMAX) 70 MG tablet TAKE 1 TABLET BY MOUTH ONCE A WEEK. TAKE ON AN EMPTY STOMACH WITH A FULL GLASS OF WATER (Patient not taking: Reported on 04/17/2020)  . atorvastatin (LIPITOR) 10 MG tablet Take 1 tablet (10 mg total) by mouth daily.  . cholestyramine (QUESTRAN) 4 g packet Take 1 packet (4 g total) by mouth 2 (two) times daily. (Patient not taking:  Reported on 04/17/2020)  . escitalopram (LEXAPRO) 20 MG tablet Take 1 tablet (20 mg total) by mouth daily. (Patient not taking: Reported on 04/17/2020)  . ibuprofen (ADVIL) 600 MG tablet Take 600 mg by mouth as needed. (Patient not taking: Reported on 04/17/2020)  . traZODone (DESYREL) 50 MG tablet Take 0.5-1 tablets (25-50 mg total) by mouth at bedtime as needed for sleep. (Patient not taking: No sig reported)   No facility-administered encounter medications on file as of 04/24/2020.    Patient Active Problem List   Diagnosis Date Noted  . Alcohol abuse, episodic drinking behavior 08/25/2018  . Aortic atherosclerosis ([place]) 07/19/2018  . Hypertension 04/07/2018  . Osteoporosis 01/03/2018  . COPD (chronic obstructive pulmonary disease) ([place]) 11/02/2017  . Umbilical hernia s/p primary repair 07/16/2017 07/17/2017  . Hyperlipidemia 07/17/2017  . Arthritis   . Adenomatous polyp of ascending colon s/p right colectomy 07/16/2017 07/16/2017  . Genetic testing 05/13/2017  . History of colonic polyps 05/03/2017  . Family history of colon cancer   . Special screening for malignant neoplasms, colon   . Tobacco abuse 03/10/2017    Conditions to be addressed/monitored: {CCM ASSESSMENT DZ OPTIONS:25047}; {CCM SW BARRIERS:25076}  Care Plan : Depression (Adult)  Updates made by Vern Claude, LCSW since 04/24/2020 12:00 AM    Problem: Symptoms (Depression)     Goal: Symptoms Monitored and Managed   Start Date: 02/06/2020  Expected End Date: 05/06/2020  Recent Progress: On track  Priority: High  Note:   Current Barriers:  Marland Kitchen Mental Health Concerns  . Lack of Knowledge of local mental health cousneling  Clinical Social Work Clinical Goal(s):  Marland Kitchen Over the next 90 days, patient will follow up with a local mental health counseling as  directed by SW  Interventions: . 1:1 collaboration with Trinna Post, PA-C regarding development and update of comprehensive plan of care as evidenced by provider  attestation and co-signature . Inter-disciplinary care team collaboration (see longitudinal plan of care) . Patient interviewed and appropriate assessments performed . Patient continues to openly discuss relationship conflicts and challenges with letting go . Discussed stress/challenges with care giving responsibilities-coping strategies explored . Supportive counseling provided, positive coping and self care emphasized .   Patient Goals/Self-Care Activities Over the next 90 days, patient will:   - Patient will attend all scheduled provider appointments Patient will call provider office for new concerns or questions - begin personal counseling - talk about feelings with a friend, family or spiritual advisor - practice positive thinking and self-talk  Follow up Plan: SW will follow up with patient by phone over the next 14 business days      Follow Up Plan: {CCM SW FOLLOW UP XBOE:78412}  SIG***

## 2020-04-24 NOTE — Chronic Care Management (AMB) (Signed)
Chronic Care Management    Clinical Social Work Note  04/24/2020 Name: Jennifer [name] MRN: [id number] DOB: July 29, 1950  Jennifer [name] is a 70 y.o. year old female who is a primary care patient of Trinna Post, [place]. The CCM team was consulted to assist the patient with chronic disease management and/or care coordination needs related to: Mental Health Counseling and Resources.   Engaged with patient by telephone for follow up visit in response to provider referral for social work chronic care management and care coordination services.   Consent to Services:  The patient was given information about Chronic Care Management services, agreed to services, and gave verbal consent prior to initiation of services.  Please see initial visit note for detailed documentation.   Patient agreed to services and consent obtained.   Assessment: Review of patient past medical history, allergies, medications, and health status, including review of relevant consultants reports was performed today as part of a comprehensive evaluation and provision of chronic care management and care coordination services.     SDOH (Social Determinants of Health) assessments and interventions performed:    Advanced Directives Status: Not addressed in this encounter.  CCM Care Plan  No Known Allergies  Outpatient Encounter Medications as of 04/24/2020  Medication Sig  . alendronate (FOSAMAX) 70 MG tablet TAKE 1 TABLET BY MOUTH ONCE A WEEK. TAKE ON AN EMPTY STOMACH WITH A FULL GLASS OF WATER (Patient not taking: Reported on 04/17/2020)  . atorvastatin (LIPITOR) 10 MG tablet Take 1 tablet (10 mg total) by mouth daily.  . cholestyramine (QUESTRAN) 4 g packet Take 1 packet (4 g total) by mouth 2 (two) times daily. (Patient not taking: Reported on 04/17/2020)  . escitalopram (LEXAPRO) 20 MG tablet Take 1 tablet (20 mg total) by mouth daily. (Patient not taking: Reported on 04/17/2020)  . ibuprofen (ADVIL) 600 MG tablet Take 600 mg  by mouth as needed. (Patient not taking: Reported on 04/17/2020)  . traZODone (DESYREL) 50 MG tablet Take 0.5-1 tablets (25-50 mg total) by mouth at bedtime as needed for sleep. (Patient not taking: No sig reported)   No facility-administered encounter medications on file as of 04/24/2020.    Patient Active Problem List   Diagnosis Date Noted  . Alcohol abuse, episodic drinking behavior 08/25/2018  . Aortic atherosclerosis ([place]) 07/19/2018  . Hypertension 04/07/2018  . Osteoporosis 01/03/2018  . COPD (chronic obstructive pulmonary disease) ([place]) 11/02/2017  . Umbilical hernia s/p primary repair 07/16/2017 07/17/2017  . Hyperlipidemia 07/17/2017  . Arthritis   . Adenomatous polyp of ascending colon s/p right colectomy 07/16/2017 07/16/2017  . Genetic testing 05/13/2017  . History of colonic polyps 05/03/2017  . Family history of colon cancer   . Special screening for malignant neoplasms, colon   . Tobacco abuse 03/10/2017    Conditions to be addressed/monitored:  Mental Health Concerns   Care Plan : Depression (Adult)  Updates made by Vern Claude, LCSW since 04/24/2020 12:00 AM    Problem: Symptoms (Depression)     Goal: Symptoms Monitored and Managed   Start Date: 02/06/2020  Expected End Date: 05/06/2020  Recent Progress: On track  Priority: High  Note:   Current Barriers:  Jennifer [name] Mental Health Concerns  . Lack of Knowledge of local mental health cousneling  Clinical Social Work Clinical Goal(s):  Jennifer [name] Over the next 90 days, patient will follow up with a local mental health counseling as directed by SW  Interventions: . 1:1 collaboration with Trinna Post, PA-C  regarding development and update of comprehensive plan of care as evidenced by provider attestation and co-signature . Inter-disciplinary care team collaboration (see longitudinal plan of care) . Patient interviewed and appropriate assessments performed . Patient continues to openly discuss relationship conflicts  and challenges with letting go . Discussed stress/challenges with care giving responsibilities-coping strategies explored . Supportive counseling provided, positive coping and self care emphasized .   Patient Goals/Self-Care Activities Over the next 90 days, patient will:   - Patient will attend all scheduled provider appointments Patient will call provider office for new concerns or questions - begin personal counseling - talk about feelings with a friend, family or spiritual advisor - practice positive thinking and self-talk  Follow up Plan: SW will follow up with patient by phone over the next 14 business days      Follow Up Plan: SW will follow up with patient by phone over the next 14 buisness days       Elliot Gurney, [place] Worker  [place] Management [phone]

## 2020-04-24 NOTE — Patient Instructions (Signed)
Visit Information  Goals Addressed            This Visit's Progress   . Manage My Emotions       Timeframe:  Long-Range Goal Priority:  High Start Date:     02/06/20                        Expected End Date: 05/06/20                      Follow Up Date 05/08/2020   - consider beginning personal counseling - talk about feelings with a friend, family or spiritual advisor - practice positive thinking and self-talk  -consider AA and obtaining a sponsor   Why is this important?    When you are stressed, down or upset, your body reacts too.   For example, your blood pressure may get higher; you may have a headache or stomachache.   When your emotions get the best of you, your body's ability to fight off cold and flu gets weak.   These steps will help you manage your emotions.     Notes:        The patient verbalized understanding of instructions, educational materials, and care plan provided today and declined offer to receive copy of patient instructions, educational materials, and care plan.   Telephone follow up appointment with care management team member scheduled for: 05/08/20    Elliot Gurney, [place] Worker  [facility] Care Management [phone]

## 2020-04-30 ENCOUNTER — Other Ambulatory Visit: Payer: Self-pay | Admitting: Gastroenterology

## 2020-04-30 DIAGNOSIS — Z8781 Personal history of (healed) traumatic fracture: Secondary | ICD-10-CM | POA: Diagnosis not present

## 2020-04-30 DIAGNOSIS — M810 Age-related osteoporosis without current pathological fracture: Secondary | ICD-10-CM | POA: Diagnosis not present

## 2020-04-30 DIAGNOSIS — F17200 Nicotine dependence, unspecified, uncomplicated: Secondary | ICD-10-CM | POA: Diagnosis not present

## 2020-04-30 DIAGNOSIS — R7989 Other specified abnormal findings of blood chemistry: Secondary | ICD-10-CM | POA: Diagnosis not present

## 2020-04-30 DIAGNOSIS — K529 Noninfective gastroenteritis and colitis, unspecified: Secondary | ICD-10-CM | POA: Diagnosis not present

## 2020-05-03 LAB — PANCREATIC ELASTASE, FECAL: Pancreatic Elastase, Fecal: >500 ug Elast./g (ref >200)

## 2020-05-08 ENCOUNTER — Telehealth: Payer: Medicare HMO | Admitting: *Deleted

## 2020-05-08 ENCOUNTER — Telehealth: Payer: Self-pay | Admitting: *Deleted

## 2020-05-08 NOTE — Telephone Encounter (Signed)
  Chronic Care Management   Outreach Note  05/08/2020 Name: Jennifer [name] MRN: [id number] DOB: [birth year]  Referred by: Trinna Post, PA-C Reason for referral : Chronic Care Management   An unsuccessful telephone outreach was attempted today. The patient was referred to the case management team for assistance with care management and care coordination.   Follow Up Plan: Telephone follow up appointment with care management team member scheduled for: 05/15/20   Elliot Gurney, [place] Worker  [facility] [phone]

## 2020-05-15 ENCOUNTER — Telehealth: Payer: Self-pay | Admitting: *Deleted

## 2020-05-15 ENCOUNTER — Telehealth: Payer: Medicare HMO | Admitting: *Deleted

## 2020-05-15 NOTE — Telephone Encounter (Signed)
  Chronic Care Management   Outreach Note  05/15/2020 Name: Jennifer [name] MRN: [id number] DOB: 1950/07/06  Referred by: Trinna Post, PA-C Reason for referral : No chief complaint on file.   A second unsuccessful telephone outreach was attempted today. The patient was referred to the case management team for assistance with care management and care coordination.   Follow Up Plan: Telephone follow up appointment with care management team member scheduled for: 05/22/20   Elliot Gurney, [place] Worker  [facility] [phone]

## 2020-05-20 DIAGNOSIS — K1123 Chronic sialoadenitis: Secondary | ICD-10-CM | POA: Diagnosis not present

## 2020-05-20 DIAGNOSIS — H9311 Tinnitus, right ear: Secondary | ICD-10-CM | POA: Diagnosis not present

## 2020-05-22 ENCOUNTER — Telehealth: Payer: Self-pay | Admitting: *Deleted

## 2020-05-22 ENCOUNTER — Telehealth: Payer: Medicare HMO | Admitting: *Deleted

## 2020-05-22 NOTE — Telephone Encounter (Signed)
  Chronic Care Management   Outreach Note  05/22/2020 Name: Jennifer [name] MRN: [id number] DOB: October 15, 1950  Referred by: Trinna Post, PA-C Reason for referral : Chronic Care Management   A second unsuccessful telephone outreach was attempted today. The patient was referred to the case management team for assistance with care management and care coordination.   Follow Up Plan: Telephone follow up appointment with care management team member scheduled for: 05/30/20   Elliot Gurney, [place] Worker  [facility] Care Management [phone]

## 2020-05-30 ENCOUNTER — Ambulatory Visit (INDEPENDENT_AMBULATORY_CARE_PROVIDER_SITE_OTHER): Payer: Medicare HMO | Admitting: *Deleted

## 2020-05-30 DIAGNOSIS — F32A Depression, unspecified: Secondary | ICD-10-CM | POA: Diagnosis not present

## 2020-05-30 DIAGNOSIS — F419 Anxiety disorder, unspecified: Secondary | ICD-10-CM | POA: Diagnosis not present

## 2020-05-30 DIAGNOSIS — F339 Major depressive disorder, recurrent, unspecified: Secondary | ICD-10-CM

## 2020-06-04 ENCOUNTER — Other Ambulatory Visit: Payer: Self-pay

## 2020-06-04 ENCOUNTER — Ambulatory Visit (INDEPENDENT_AMBULATORY_CARE_PROVIDER_SITE_OTHER): Payer: Medicare HMO | Admitting: Family Medicine

## 2020-06-04 ENCOUNTER — Ambulatory Visit
Admission: RE | Admit: 2020-06-04 | Discharge: 2020-06-04 | Disposition: A | Discharge status: Home / Self Care | Payer: Medicare HMO | Attending: Family Medicine | Admitting: Family Medicine

## 2020-06-04 ENCOUNTER — Encounter: Payer: Self-pay | Admitting: Family Medicine

## 2020-06-04 ENCOUNTER — Ambulatory Visit
Admission: RE | Admit: 2020-06-04 | Discharge: 2020-06-04 | Disposition: A | Discharge status: Home / Self Care | Payer: Medicare HMO | Source: Ambulatory Visit | Attending: Family Medicine | Admitting: Family Medicine

## 2020-06-04 VITALS — BP 123/78 | HR 67 | Temp 98.4°F | Wt 139.0 lb

## 2020-06-04 DIAGNOSIS — M19011 Primary osteoarthritis, right shoulder: Secondary | ICD-10-CM | POA: Diagnosis not present

## 2020-06-04 DIAGNOSIS — G8929 Other chronic pain: Secondary | ICD-10-CM

## 2020-06-04 DIAGNOSIS — M25511 Pain in right shoulder: Secondary | ICD-10-CM

## 2020-06-04 NOTE — Progress Notes (Signed)
Acute Office Visit  Subjective:    Patient ID: Jennifer [name], female    DOB: 1950-10-14, 70 y.o.   MRN: [id number]  No chief complaint on file.   HPI The patient is a 70 year old female who presents for evaluation of right arm and shoulder pain that she has had for about 6 months.  She states she had been using barbells and thinks that may be what has caused the pain.  She also reports playing games on her phone mainly using that hand/fingers.  She reports that when playing the games she sometime experiences great pain in the arm.   Past Medical History:  Diagnosis Date  . Arthritis    mild  . Cancer (HCC)    skin cancer leg and chest  . Dysrhythmia    palpitation  . Family history of colon cancer   . Headache    as  a child   . Heart murmur    while pregnant  . Hyperlipidemia 07/17/2017  . Tobacco abuse     Past Surgical History:  Procedure Laterality Date  . ABDOMINAL HYSTERECTOMY     1996  . COLONOSCOPY WITH PROPOFOL N/A 03/29/2017   Procedure: COLONOSCOPY WITH PROPOFOL;  Surgeon: Lin Landsman, MD;  Location: [facility] ENDOSCOPY;  Service: Gastroenterology;  Laterality: N/A;  . COLONOSCOPY WITH PROPOFOL N/A 03/04/2020   Procedure: COLONOSCOPY WITH PROPOFOL;  Surgeon: Lin Landsman, MD;  Location: [facility] ENDOSCOPY;  Service: Gastroenterology;  Laterality: N/A;  . HEMICOLECTOMY     righ  [id number]  . LAPAROSCOPIC RIGHT HEMI COLECTOMY Right 07/16/2017   Procedure: LAPAROSCOPIC RIGHT HEMI COLECTOMY;  Surgeon: Ileana Roup, MD;  Location: [facility] ORS;  Service: General;  Laterality: Right;  . TUBAL LIGATION      Family History  Problem Relation Age of Onset  . Diabetes Mother   . Kidney disease Mother   . Heart attack Father   . Diabetes Maternal Grandmother   . Colon cancer Maternal Grandfather   . Heart disease Paternal Grandmother   . Colon cancer Paternal Uncle        dx >50, colon completely removed  . Colon cancer Paternal Aunt 82  . Breast cancer Neg Hx      Social History   Socioeconomic History  . Marital status: Divorced    Spouse name: Not on file  . Number of children: 1  . Years of education: Not on file  . Highest education level: 10th grade  Occupational History  . Occupation: retired  Tobacco Use  . Smoking status: Current Every Day Smoker    Packs/day: 0.75    Years: 46.35    Pack years: 34.76    Types: Cigarettes  . Smokeless tobacco: Never Used  . Tobacco comment: Currently on Chantix  Vaping Use  . Vaping Use: Never used  Substance and Sexual Activity  . Alcohol use: Yes    Alcohol/week: 14.0 standard drinks    Types: 14 Shots of liquor per week    Comment: "Im an alcoholic"   . Drug use: No  . Sexual activity: Not Currently  Other Topics Concern  . Not on file  Social History Narrative   1 son deceased   Social Determinants of Health   Financial Resource Strain: Low Risk   . Difficulty of Paying Living Expenses: Not hard at all  Food Insecurity: No Food Insecurity  . Worried About Charity fundraiser in the Last Year: Never true  . Ran  Out of Food in the Last Year: Never true  Transportation Needs: No Transportation Needs  . Lack of Transportation (Medical): No  . Lack of Transportation (Non-Medical): No  Physical Activity: Inactive  . Days of Exercise per Week: 0 days  . Minutes of Exercise per Session: 0 min  Stress: Stress Concern Present  . Feeling of Stress : Very much  Social Connections: Socially Isolated  . Frequency of Communication with Friends and Family: More than three times a week  . Frequency of Social Gatherings with Friends and Family: More than three times a week  . Attends Religious Services: Never  . Active Member of Clubs or Organizations: No  . Attends Archivist Meetings: Never  . Marital Status: Divorced  Human resources officer Violence: Not At Risk  . Fear of Current or Ex-Partner: No  . Emotionally Abused: No  . Physically Abused: No  . Sexually Abused: No     Outpatient Medications Prior to Visit  Medication Sig Dispense Refill  . atorvastatin (LIPITOR) 10 MG tablet Take 1 tablet (10 mg total) by mouth daily. 90 tablet 1  . alendronate (FOSAMAX) 70 MG tablet TAKE 1 TABLET BY MOUTH ONCE A WEEK. TAKE ON AN EMPTY STOMACH WITH A FULL GLASS OF WATER 12 tablet 0  . cholestyramine (QUESTRAN) 4 g packet Take 1 packet (4 g total) by mouth 2 (two) times daily. 60 each 1  . escitalopram (LEXAPRO) 20 MG tablet Take 1 tablet (20 mg total) by mouth daily. 90 tablet 1  . ibuprofen (ADVIL) 600 MG tablet Take 600 mg by mouth as needed.    . traZODone (DESYREL) 50 MG tablet Take 0.5-1 tablets (25-50 mg total) by mouth at bedtime as needed for sleep. 30 tablet 0   No facility-administered medications prior to visit.    No Known Allergies  Review of Systems  Musculoskeletal: Positive for arthralgias. Negative for back pain, joint swelling, myalgias, neck pain and neck stiffness.       No decreased range of motion.       Objective:    Physical Exam Constitutional:      General: She is not in acute distress.    Appearance: She is well-developed.  HENT:     Head: Normocephalic and atraumatic.     Right Ear: Hearing normal.     Left Ear: Hearing normal.     Nose: Nose normal.  Eyes:     General: Lids are normal. No scleral icterus.       Right eye: No discharge.        Left eye: No discharge.     Conjunctiva/sclera: Conjunctivae normal.  Pulmonary:     Effort: Pulmonary effort is normal. No respiratory distress.  Musculoskeletal:        General: Tenderness present. Normal range of motion.     Comments: Tenderness in right deltoid muscle at midshaft of humerus to lift arm laterally. No sign of rotator cuff injury. No crepitus. Good strength throughout.  Skin:    Findings: No lesion or rash.  Neurological:     Mental Status: She is alert and oriented to person, place, and time.  Psychiatric:        Speech: Speech normal.        Behavior:  Behavior normal.        Thought Content: Thought content normal.     BP 123/78 (BP Location: Right Arm, Patient Position: Sitting, Cuff Size: Normal)   Pulse 67   Temp 98.4 F (  36.9 C) (Oral)   Wt 139 lb (63 kg)   SpO2 100%   BMI 19.94 kg/m  Wt Readings from Last 3 Encounters:  06/04/20 139 lb (63 kg)  04/17/20 135 lb 4 oz (61.3 kg)  03/04/20 134 lb (60.8 kg)    Health Maintenance Due  Topic Date Due  . COVID-19 Vaccine (4 - Booster for Moderna series) 06/02/2020    There are no preventive care reminders to display for this patient.   Lab Results  Component Value Date   TSH 1.800 01/31/2020   Lab Results  Component Value Date   WBC 7.4 01/31/2020   HGB 14.4 01/31/2020   HCT 40.9 01/31/2020   MCV 92 01/31/2020   PLT 162 01/31/2020   Lab Results  Component Value Date   NA 140 01/31/2020   K 3.3 (L) 01/31/2020   CO2 22 01/31/2020   GLUCOSE 82 01/31/2020   BUN 8 01/31/2020   CREATININE 0.82 01/31/2020   BILITOT <0.2 04/17/2020   ALKPHOS 71 04/17/2020   AST 21 04/17/2020   ALT 15 04/17/2020   PROT 6.3 04/17/2020   ALBUMIN 4.2 04/17/2020   CALCIUM 9.5 01/31/2020   ANIONGAP 12 08/24/2018   Lab Results  Component Value Date   CHOL 241 (H) 01/31/2020   Lab Results  Component Value Date   HDL 112 01/31/2020   Lab Results  Component Value Date   LDLCALC 115 (H) 01/31/2020   Lab Results  Component Value Date   TRIG 84 01/31/2020   Lab Results  Component Value Date   CHOLHDL 2.2 01/31/2020   Lab Results  Component Value Date   HGBA1C 5.0 07/13/2017       Assessment & Plan:   1. Chronic right shoulder pain Had some right shoulder pain after lifting weights 6 months ago. It got a little better when she stopped but she notices lifting causes recurrence. Also, had a fall on the back of her right shoulder a couple months ago. May use Aspercreme with Lidocaine prn. Suspect deltoid and shoulder strain. Will get x-ray to rule out arthritis or bony  injury. Recheck prn. May need PT or orthopedic referral. - DG Shoulder Left       Juluis Mire, CMA   I, Ceirra Belli, PA-C, have reviewed all documentation for this visit. The documentation on 06/04/20 for the exam, diagnosis, procedures, and orders are all accurate and complete.

## 2020-06-04 NOTE — Addendum Note (Signed)
Addended by: Althea Charon D on: 06/04/2020 10:35 AM   Modules accepted: Orders

## 2020-06-04 NOTE — Chronic Care Management (AMB) (Signed)
  Chronic Care Management    Clinical Social Work Note  06/04/2020 Name: Jennifer [name] MRN: [id number] DOB: [birth year]  Jennifer [name] is a 70 y.o. year old female who is a primary care patient of Trinna Post, [place]. The CCM team was consulted to assist the patient with chronic disease management and/or care coordination needs related to: Mental Health Counseling and Resources.   Case closure performed due to inability to maintain contact with patient.   Patient agreed to services and consent previoulsy obtained.   Assessment: Review of patient past medical history, allergies, medications, and health status, including review of relevant consultants reports was performed today as part of a comprehensive evaluation and provision of chronic care management and care coordination services.     SDOH (Social Determinants of Health) assessments and interventions performed:    Advanced Directives Status: Not addressed in this encounter.  CCM Care Plan  No Known Allergies  Outpatient Encounter Medications as of 05/30/2020  Medication Sig  . alendronate (FOSAMAX) 70 MG tablet TAKE 1 TABLET BY MOUTH ONCE A WEEK. TAKE ON AN EMPTY STOMACH WITH A FULL GLASS OF WATER  . atorvastatin (LIPITOR) 10 MG tablet Take 1 tablet (10 mg total) by mouth daily.  Marland Kitchen escitalopram (LEXAPRO) 20 MG tablet Take 1 tablet (20 mg total) by mouth daily.  Marland Kitchen ibuprofen (ADVIL) 600 MG tablet Take 600 mg by mouth as needed.  . traZODone (DESYREL) 50 MG tablet Take 0.5-1 tablets (25-50 mg total) by mouth at bedtime as needed for sleep.   No facility-administered encounter medications on file as of 05/30/2020.    Patient Active Problem List   Diagnosis Date Noted  . Alcohol abuse, episodic drinking behavior 08/25/2018  . Aortic atherosclerosis ([place]) 07/19/2018  . Hypertension 04/07/2018  . Osteoporosis 01/03/2018  . COPD (chronic obstructive pulmonary disease) ([place]) 11/02/2017  . Umbilical hernia s/p primary repair 07/16/2017  07/17/2017  . Hyperlipidemia 07/17/2017  . Arthritis   . Adenomatous polyp of ascending colon s/p right colectomy 07/16/2017 07/16/2017  . Genetic testing 05/13/2017  . History of colonic polyps 05/03/2017  . Family history of colon cancer   . Special screening for malignant neoplasms, colon   . Tobacco abuse 03/10/2017    Conditions to be addressed/monitored: Depression; Mental Health Concerns   Care Plan : Depression (Adult)  Updates made by Vern Claude, LCSW since 06/04/2020 12:00 AM    Problem: Symptoms (Depression)     Goal: Symptoms Monitored and Managed   Start Date: 02/06/2020  Expected End Date: 05/06/2020  Recent Progress: On track  Priority: High  Note:   Current Barriers:  Marland Kitchen Mental Health Concerns  . Lack of Knowledge of local mental health cousneling  Clinical Social Work Clinical Goal(s):  Marland Kitchen Over the next 90 days, patient will follow up with a local mental health counseling as directed by SW  Interventions: . Three unsuccessful calls made to patient, with no return call.  . There will be no further outreach calls to take place at this time, case closure performed  Patient Goals/Self-Care Activities    Task: Alleviate Barriers to Depression Treatment   Due Date: 05/06/2020  Priority: Routine  Note:   Care Management Activities:     Notes:       Follow Up Plan: Case closure performed due to inability to maintain contact with this social worker       Elliot Gurney, [place] Worker  [facility] Care Management [phone]

## 2020-06-04 NOTE — Patient Instructions (Signed)
Shoulder Range of Motion Exercises Shoulder range of motion (ROM) exercises are done to keep the shoulder moving freely or to increase movement. They are often recommended for people who have shoulder pain or stiffness or who are recovering from a shoulder surgery. Phase 1 exercises When you are able, do this exercise 1-2 times per day for 30-60 seconds in each direction, or as directed by your health care provider. Pendulum exercise To do this exercise while sitting: 1. Sit in a chair or at the edge of your bed with your feet flat on the floor. 2. Let your affected arm hang down in front of you over the edge of the bed or chair. 3. Relax your shoulder, arm, and hand. [place] your body so your arm gently swings in small circles. You can also use your unaffected arm to start the motion. 5. Repeat changing the direction of the circles, swinging your arm left and right, and swinging your arm forward and back. To do this exercise while standing: 1. Stand next to a sturdy chair or table, and hold on to it with your hand on your unaffected side. 2. Bend forward at the waist. 3. Bend your knees slightly. 4. Relax your shoulder, arm, and hand. 5. While keeping your shoulder relaxed, use body motion to swing your arm in small circles. 6. Repeat changing the direction of the circles, swinging your arm left and right, and swinging your arm forward and back. 7. Between exercises, stand up tall and take a short break to relax your lower back.   Phase 2 exercises Do these exercises 1-2 times per day or as told by your health care provider. Hold each stretch for 30 seconds, and repeat 3 times. Do the exercises with one or both arms as instructed by your health care provider. For these exercises, sit at a table with your hand and arm supported by the table. A chair that slides easily or has wheels can be helpful. External rotation 1. Turn your chair so that your affected side is nearest to the  table. 2. Place your forearm on the table to your side. Bend your elbow about 90 at the elbow (right angle) and place your hand palm facing down on the table. Your elbow should be about 6 inches away from your side. 3. Keeping your arm on the table, lean your body forward. Abduction 1. Turn your chair so that your affected side is nearest to the table. 2. Place your forearm and hand on the table so that your thumb points toward the ceiling and your arm is straight out to your side. 3. Slide your hand out to the side and away from you, using your unaffected arm to do the work. 4. To increase the stretch, you can slide your chair away from the table. Flexion: forward stretch 1. Sit facing the table. Place your hand and elbow on the table in front of you. 2. Slide your hand forward and away from you, using your unaffected arm to do the work. 3. To increase the stretch, you can slide your chair backward. Phase 3 exercises Do these exercises 1-2 times per day or as told by your health care provider. Hold each stretch for 30 seconds, and repeat 3 times. Do the exercises with one or both arms as instructed by your health care provider. Cross-body stretch: posterior capsule stretch 1. Lift your arm straight out in front of you. 2. Bend your arm 90 at the elbow (right angle) so your  forearm moves across your body. 3. Use your other arm to gently pull the elbow across your body, toward your other shoulder. Wall climbs 1. Stand with your affected arm extended out to the side with your hand resting on a door frame. 2. Slide your hand slowly up the door frame. 3. To increase the stretch, step through the door frame. Keep your body upright and do not lean. Wand exercises You will need a cane, a piece of PVC pipe, or a sturdy wooden dowel for wand exercises. Flexion To do this exercise while standing: 1. Hold the wand with both of your hands, palms down. 2. Using the other arm to help, lift your arms  up and over your head, if able. 3. Push upward with your other arm to gently increase the stretch. To do this exercise while lying down: 1. Lie on your back with your elbows resting on the floor and the wand in both your hands. Your hands will be palm down, or pointing toward your feet. 2. Lift your hands toward the ceiling, using your unaffected arm to help if needed. 3. Bring your arms overhead as able, using your unaffected arm to help if needed. Internal rotation 1. Stand while holding the wand behind you with both hands. Your unaffected arm should be extended above your head with the arm of the affected side extended behind you at the level of your waist. The wand should be pointing straight up and down as you hold it. 2. Slowly pull the wand up behind your back by straightening the elbow of your unaffected arm and bending the elbow of your affected arm. External rotation 1. Lie on your back with your affected upper arm supported on a small pillow or rolled towel. When you first do this exercise, keep your upper arm close to your body. Over time, bring your arm up to a 90 angle out to the side. 2. Hold the wand across your stomach and with both hands palm up. Your elbow on your affected side should be bent at a 90 angle. 3. Use your unaffected side to help push your forearm away from you and toward the floor. Keep your elbow on your affected side bent at a 90 angle. Contact a health care provider if you have:  New or increasing pain.  New numbness, tingling, weakness, or discoloration in your arm or hand. This information is not intended to replace advice given to you by your health care provider. Make sure you discuss any questions you have with your health care provider. Document Revised: 02/24/2017 Document Reviewed: 02/24/2017 Elsevier Patient Education  2021 [place]. Shoulder Sprain  A shoulder sprain is a partial or complete tear in one of the tough, fiber-like tissues  (ligaments) in the shoulder. The ligaments in the shoulder help to hold the shoulder in place. What are the causes? This condition may be caused by:  A fall.  A hit to the shoulder.  A twist of the arm. What increases the risk? You are more likely to develop this condition if you:  Play sports.  Have problems with balance or coordination. What are the signs or symptoms? Symptoms of this condition include:  Pain when moving the shoulder.  Limited ability to move the shoulder.  Swelling and tenderness on top of the shoulder.  Warmth in the shoulder.  A change in the shape of the shoulder.  Redness or bruising on the shoulder. How is this diagnosed? This condition is diagnosed with:  A physical exam. During the exam, you may be asked to do simple exercises with your shoulder.  Imaging tests such as X-rays, MRI, or a CT scan. These tests can show how severe the sprain is. How is this treated? This condition may be treated with:  Rest.  Pain medicine.  Ice.  A sling or brace. This is used to keep the arm still while the shoulder is healing.  Physical therapy or rehabilitation exercises. These help to improve the range of motion and strength of the shoulder.  Surgery (rare). Surgery may be needed if the sprain caused a joint to become unstable. Surgery may also be needed to reduce pain. Some people may develop ongoing shoulder pain or lose some range of motion in the shoulder. However, most people do not develop long-term problems. Follow these instructions at home: If you have a sling or brace:  Wear the sling or brace as told by your health care provider. Remove it only as told by your health care provider.  Loosen the sling or brace if your fingers tingle, become numb, or turn cold and blue.  Keep the sling or brace clean.  If the sling or brace is not waterproof: ? Do not let it get wet. ? Cover it with a watertight covering when you take a bath or  shower. Activity  Rest your shoulder.  Move your arm only as much as told by your health care provider, but move your hand and fingers often to prevent stiffness and swelling.  Return to your normal activities as told by your health care provider. Ask your health care provider what activities are safe for you.  Ask your health care provider when it is safe for you to drive if you have a sling or brace on your shoulder.  If you were shown how to do any exercises, do them as told by your health care provider. General instructions  If directed, put ice on the affected area. ? Put ice in a plastic bag. ? Place a towel between your skin and the bag. ? Leave the ice on for 20 minutes, 2-3 times a day.  Take over-the-counter and prescription medicines only as told by your health care provider.  Do not use any products that contain nicotine or tobacco, such as cigarettes, e-cigarettes, and chewing tobacco. These can delay healing. If you need help quitting, ask your health care provider.  Keep all follow-up visits as told by your health care provider. This is important.   Contact a health care provider if:  Your pain gets worse.  Your pain is not relieved with medicines.  You have increased redness or swelling. Get help right away if:  You have a fever.  You cannot move your arm or shoulder.  You develop severe numbness or tingling in your arm, hand, or fingers.  Your arm, hand, or fingers feel cold and turn blue, white, or gray. Summary  A shoulder sprain is a partial or complete tear in one of the tough, fiber-like tissues (ligaments) in the shoulder.  This condition may be caused by a fall, a hit to the shoulder, or a twist of the arm.  Treatment usually includes rest, ice, and pain medicine as needed.  If you have a sling or brace, wear it as told by your health care provider. Remove it only as told by your health care provider. This information is not intended to replace  advice given to you by your health care provider. Make sure  you discuss any questions you have with your health care provider. Document Revised: 06/18/2017 Document Reviewed: 06/18/2017 Elsevier Patient Education  2021 [employer].

## 2020-06-08 DIAGNOSIS — Z85828 Personal history of other malignant neoplasm of skin: Secondary | ICD-10-CM | POA: Insufficient documentation

## 2020-07-30 ENCOUNTER — Ambulatory Visit: Payer: Medicare HMO | Admitting: Physician Assistant

## 2020-07-30 ENCOUNTER — Ambulatory Visit: Payer: Medicare HMO | Admitting: Family Medicine

## 2020-08-13 ENCOUNTER — Telehealth: Payer: Self-pay

## 2020-08-13 ENCOUNTER — Ambulatory Visit: Payer: Medicare HMO | Admitting: Family Medicine

## 2020-08-13 NOTE — Telephone Encounter (Signed)
Patient was scheduled to be seen today with Vernie Murders. Appointment rescheduled with Tally Joe for 07/25 at 1:00 pm.  Patient requested if another provider can put a referral for her to Dermatology due to a skin cancer on her neck?

## 2020-08-13 NOTE — Telephone Encounter (Signed)
I don't see any record of this being evaluated. She will need to discuss with Tally Joe next week.

## 2020-08-13 NOTE — Telephone Encounter (Signed)
Patient advised and verbalized understanding 

## 2020-08-14 ENCOUNTER — Telehealth: Payer: Self-pay

## 2020-08-14 NOTE — Telephone Encounter (Signed)
See telephone encounter from 08/13/20, appt has been scheduled with Daneil Dan no futher calls have been made to patient. KW  Copied from [place] [id number]. Topic: General - Other >> Aug 14, 2020  4:11 PM Tessa Lerner A wrote: Reason for CRM: Patient has returned missed call from office  Agent saw no open encounters at the time of call  Please contact to further advise when possible

## 2020-08-19 ENCOUNTER — Other Ambulatory Visit: Payer: Self-pay

## 2020-08-19 ENCOUNTER — Encounter: Payer: Self-pay | Admitting: Family Medicine

## 2020-08-19 ENCOUNTER — Ambulatory Visit (INDEPENDENT_AMBULATORY_CARE_PROVIDER_SITE_OTHER): Payer: Medicare HMO | Admitting: Family Medicine

## 2020-08-19 VITALS — BP 139/79 | HR 72 | Temp 98.2°F | Resp 16 | Wt 137.4 lb

## 2020-08-19 DIAGNOSIS — M1990 Unspecified osteoarthritis, unspecified site: Secondary | ICD-10-CM

## 2020-08-19 DIAGNOSIS — R002 Palpitations: Secondary | ICD-10-CM

## 2020-08-19 DIAGNOSIS — F1020 Alcohol dependence, uncomplicated: Secondary | ICD-10-CM | POA: Insufficient documentation

## 2020-08-19 DIAGNOSIS — E7800 Pure hypercholesterolemia, unspecified: Secondary | ICD-10-CM

## 2020-08-19 DIAGNOSIS — J439 Emphysema, unspecified: Secondary | ICD-10-CM | POA: Diagnosis not present

## 2020-08-19 DIAGNOSIS — M810 Age-related osteoporosis without current pathological fracture: Secondary | ICD-10-CM

## 2020-08-19 DIAGNOSIS — I700 Atherosclerosis of aorta: Secondary | ICD-10-CM

## 2020-08-19 DIAGNOSIS — N952 Postmenopausal atrophic vaginitis: Secondary | ICD-10-CM

## 2020-08-19 DIAGNOSIS — Z720 Tobacco use: Secondary | ICD-10-CM

## 2020-08-19 DIAGNOSIS — Z1283 Encounter for screening for malignant neoplasm of skin: Secondary | ICD-10-CM | POA: Diagnosis not present

## 2020-08-19 DIAGNOSIS — I10 Essential (primary) hypertension: Secondary | ICD-10-CM | POA: Diagnosis not present

## 2020-08-19 DIAGNOSIS — E785 Hyperlipidemia, unspecified: Secondary | ICD-10-CM

## 2020-08-19 DIAGNOSIS — F1010 Alcohol abuse, uncomplicated: Secondary | ICD-10-CM

## 2020-08-19 MED ORDER — PREMARIN 0.625 MG/GM VA CREA: 1.0000 | TOPICAL_CREAM | Freq: Every day | VAGINAL | 12 refills | Status: DC

## 2020-08-19 NOTE — Assessment & Plan Note (Signed)
Blood pressure stable, without the use of anti-hypertensive Continue monitoring at routine intervals May need addition of medication at later date; not indicated at this time.

## 2020-08-19 NOTE — Assessment & Plan Note (Signed)
Pt already referred and seeing endocrine. Discussed how to take alendronate, pt aware and was able to participate in teach back Additional medication may be added on at later date by endocrine.

## 2020-08-19 NOTE — Assessment & Plan Note (Signed)
Pt aware of quantity of alcohol consumed is putting her at risk for complications Does not wish to reduce intake at this time  Labs to be monitored for nutritional deficits if complications arise

## 2020-08-19 NOTE — Assessment & Plan Note (Signed)
Continued tobacco use Aware of risks with prolonged use Does not wish to quit at this time

## 2020-08-19 NOTE — Assessment & Plan Note (Addendum)
Generalized complaints of arthritis. Up within the home, has not exercised outside of the home. Plans to start walking again. Difficult to do within hot temperatures, pt advised to try indoor walking if possible. No falls or signs of injuries

## 2020-08-19 NOTE — Assessment & Plan Note (Signed)
Denies cough, shortness of breath, or wheezing. Lungs clear throughout Pt has had COVID vaccines and booster, UTD at this time

## 2020-08-19 NOTE — Assessment & Plan Note (Signed)
On statin, stable.   Will obtain labs at regular intervals

## 2020-08-19 NOTE — Assessment & Plan Note (Signed)
Ongoing, chronic concern. No acute changes. Continue monitoring ASCVD risk and lipid levels. Smoking cessation and alcohol cessation encouraged

## 2020-08-19 NOTE — Assessment & Plan Note (Signed)
New onset Complaint of pain with penis entering vagina Pt currently using KY lubrication. Pt educated on potential need for estrogen given age and possible vaginal atrophy and dryness related to depleting estrogen levels; samples provided for load of estrogen cream.  Rx provided- pt educated to load for 2 weeks and then reduce use to 3x/wk based on comfort.

## 2020-08-19 NOTE — Assessment & Plan Note (Signed)
Occur 1x/week, up to 10 mins at a time. Denies dizziness

## 2020-08-19 NOTE — Assessment & Plan Note (Signed)
Small, dime sized irritation L neck near jaw line Red, flaky appearance No discharge Pt notes it's been there ~ 1 year Derm referral ordered

## 2020-08-19 NOTE — Progress Notes (Signed)
Established patient visit   Patient: Jennifer [name]   DOB: 1950/05/12   70 y.o. Female  MRN: [id number] Visit Date: 08/19/2020  Today's healthcare provider: Gwyneth Sprout, FNP   Chief Complaint  Patient presents with   Depression   Hyperlipidemia   Vaginal Pain   Subjective    HPI  Depression, Follow-up  She  was last seen for this 6 months ago. Changes made at last visit include none, continue Lexapro.   She reports excellent compliance with treatment. She is not having side effects.   She reports excellent tolerance of treatment. Current symptoms include: depressed mood and insomnia She feels she is Improved since last visit.  Depression screen [facility] 2/9 08/19/2020 01/31/2020 09/14/2019  Decreased Interest 0 2 0  Down, Depressed, Hopeless 0 2 0  PHQ - 2 Score 0 4 0  Altered sleeping 3 3 -  Tired, decreased energy 2 3 -  Change in appetite 1 3 -  Feeling bad or failure about yourself  0 0 -  Trouble concentrating 0 0 -  Moving slowly or fidgety/restless 0 0 -  Suicidal thoughts 0 0 -  PHQ-9 Score 6 13 -  Difficult doing work/chores Not difficult at all Somewhat difficult -    -----------------------------------------------------------------------------------------  Lipid/Cholesterol, Follow-up  Last lipid panel Other pertinent labs  Lab Results  Component Value Date   CHOL 241 (H) 01/31/2020   HDL 112 01/31/2020   LDLCALC 115 (H) 01/31/2020   TRIG 84 01/31/2020   CHOLHDL 2.2 01/31/2020   Lab Results  Component Value Date   ALT 15 04/17/2020   AST 21 04/17/2020   PLT 162 01/31/2020   TSH 1.800 01/31/2020     She was last seen for this 6 months ago.  Management since that visit includes none.  She reports excellent compliance with treatment. She is not having side effects.   Symptoms: No chest pain No chest pressure/discomfort  No dyspnea No lower extremity edema  No numbness or tingling of extremity No orthopnea  Yes palpitations No paroxysmal  nocturnal dyspnea  No speech difficulty No syncope   Current diet: well balanced Current exercise: none  The ASCVD Risk score ([place]., et al., 2013) failed to calculate for the following reasons:   The valid HDL cholesterol range is 20 to 100 mg/dL  ---------------------------------------------------------------------------------------------------  Patient Active Problem List   Diagnosis Date Noted   Heart palpitations 08/19/2020   Vaginal atrophy 08/19/2020   Alcoholism ([place]) 08/19/2020   Skin exam, screening for cancer 08/19/2020   Aortic atherosclerosis ([place]) 07/19/2018   Hypertension 04/07/2018   Osteoporosis 01/03/2018   COPD (chronic obstructive pulmonary disease) ([place]) 123456   Umbilical hernia s/p primary repair 07/16/2017 07/17/2017   Hyperlipidemia 07/17/2017   Arthritis    Adenomatous polyp of ascending colon s/p right colectomy 07/16/2017 07/16/2017   History of colonic polyps 05/03/2017   Family history of colon cancer    Special screening for malignant neoplasms, colon    Tobacco abuse 03/10/2017   Past Medical History:  Diagnosis Date   Arthritis    mild   Cancer ([place])    skin cancer leg and chest   Dysrhythmia    palpitation   Family history of colon cancer    Headache    as  a child    Heart murmur    while pregnant   Hyperlipidemia 07/17/2017   Tobacco abuse    No Known Allergies     Medications:  Outpatient Medications Prior to Visit  Medication Sig   alendronate (FOSAMAX) 70 MG tablet TAKE 1 TABLET BY MOUTH ONCE A WEEK. TAKE ON AN EMPTY STOMACH WITH A FULL GLASS OF WATER   atorvastatin (LIPITOR) 10 MG tablet Take 1 tablet (10 mg total) by mouth daily.   calcium carbonate (OS-CAL - DOSED IN MG OF ELEMENTAL CALCIUM) 1250 (500 Ca) MG tablet Take 1 tablet by mouth once a week.   Vitamin D, Ergocalciferol, (DRISDOL) 1.25 MG (50000 UNIT) CAPS capsule Take 50,000 Units by mouth every 7 (seven) days.   [DISCONTINUED] escitalopram (LEXAPRO) 20 MG  tablet Take 1 tablet (20 mg total) by mouth daily.   [DISCONTINUED] cholestyramine (QUESTRAN) 4 g packet Take 1 packet (4 g total) by mouth 2 (two) times daily.   [DISCONTINUED] ibuprofen (ADVIL) 600 MG tablet Take 600 mg by mouth as needed.   [DISCONTINUED] traZODone (DESYREL) 50 MG tablet Take 0.5-1 tablets (25-50 mg total) by mouth at bedtime as needed for sleep.   No facility-administered medications prior to visit.    Review of Systems     Objective    BP 139/79   Pulse 72   Temp 98.2 F (36.8 C) (Oral)   Resp 16   Wt 137 lb 6.4 oz (62.3 kg)   SpO2 99%   BMI 19.71 kg/m     Physical Exam Constitutional:      Appearance: Normal appearance.  HENT:     Head: Normocephalic.  Cardiovascular:     Rate and Rhythm: Normal rate and regular rhythm.     Pulses: Normal pulses.     Heart sounds: No murmur heard.   No friction rub. No gallop.  Pulmonary:     Effort: Pulmonary effort is normal.     Breath sounds: Normal breath sounds.  Abdominal:     General: Bowel sounds are normal.     Palpations: Abdomen is soft.  Genitourinary:    Comments: Exam deferred; pt complaints of pain with vaginal entry. Musculoskeletal:        General: Normal range of motion.     Cervical back: Normal range of motion.  Skin:    General: Skin is warm and dry.     Capillary Refill: Capillary refill takes 2 to 3 seconds.  Neurological:     General: No focal deficit present.     Mental Status: She is alert and oriented to person, place, and time.  Psychiatric:        Mood and Affect: Mood normal.        Behavior: Behavior normal.        Thought Content: Thought content normal.        Judgment: Judgment normal.      No results found for any visits on 08/19/20.  Assessment & Plan     Problem List Items Addressed This Visit       Cardiovascular and Mediastinum   Hypertension    Blood pressure stable, without the use of anti-hypertensive Continue monitoring at routine intervals May  need addition of medication at later date; not indicated at this time.        Relevant Orders   Comprehensive metabolic panel   Lipid panel   Aortic atherosclerosis (HCC)    Ongoing, chronic concern. No acute changes. Continue monitoring ASCVD risk and lipid levels. Smoking cessation and alcohol cessation encouraged         Respiratory   COPD (chronic obstructive pulmonary disease) (HCC)    Denies cough, shortness  of breath, or wheezing. Lungs clear throughout Pt has had COVID vaccines and booster, UTD at this time       Relevant Orders   Comprehensive metabolic panel     Musculoskeletal and Integument   Arthritis    Generalized complaints of arthritis. Up within the home, has not exercised outside of the home. Plans to start walking again. Difficult to do within hot temperatures, pt advised to try indoor walking if possible. No falls or signs of injuries       Relevant Orders   CBC with Differential/Platelet   Comprehensive metabolic panel   VITAMIN D 25 Hydroxy (Vit-D Deficiency, Fractures)   TSH   Osteoporosis    Pt already referred and seeing endocrine. Discussed how to take alendronate, pt aware and was able to participate in teach back Additional medication may be added on at later date by endocrine.        Relevant Medications   Vitamin D, Ergocalciferol, (DRISDOL) 1.25 MG (50000 UNIT) CAPS capsule   calcium carbonate (OS-CAL - DOSED IN MG OF ELEMENTAL CALCIUM) 1250 (500 Ca) MG tablet   Other Relevant Orders   Comprehensive metabolic panel   VITAMIN D 25 Hydroxy (Vit-D Deficiency, Fractures)     Genitourinary   Vaginal atrophy    New onset Complaint of pain with penis entering vagina Pt currently using KY lubrication. Pt educated on potential need for estrogen given age and possible vaginal atrophy and dryness related to depleting estrogen levels; samples provided for load of estrogen cream.  Rx provided- pt educated to load for 2 weeks and then  reduce use to 3x/wk based on comfort.       Relevant Medications   conjugated estrogens (PREMARIN) vaginal cream     Other   Tobacco abuse    Continued tobacco use Aware of risks with prolonged use Does not wish to quit at this time       Hyperlipidemia - Primary    On statin, stable.   Will obtain labs at regular intervals       Relevant Orders   CBC with Differential/Platelet   Comprehensive metabolic panel   Lipid panel   Hemoglobin A1c   Heart palpitations    Occur 1x/week, up to 10 mins at a time. Denies dizziness        Alcoholism ([place])   Skin exam, screening for cancer    Small, dime sized irritation L neck near jaw line Red, flaky appearance No discharge Pt notes it's been there ~ 1 year Derm referral ordered       Relevant Orders   CBC with Differential/Platelet   Comprehensive metabolic panel   Ambulatory referral to Dermatology   RESOLVED: Alcohol abuse, episodic drinking behavior    Pt aware of quantity of alcohol consumed is putting her at risk for complications Does not wish to reduce intake at this time  Labs to be monitored for nutritional deficits if complications arise       Other Visit Diagnoses     Hypercholesterolemia            No follow-ups on file.      {I, Gwyneth Sprout, FNP, have reviewed all documentation for this visit. The documentation on 08/19/20 for the exam, diagnosis, procedures, and orders are all accurate and complete.   Gwyneth Sprout, [place] [phone] (phone) [phone] (fax)  [place]

## 2020-08-27 DIAGNOSIS — E559 Vitamin D deficiency, unspecified: Secondary | ICD-10-CM | POA: Diagnosis not present

## 2020-09-02 ENCOUNTER — Ambulatory Visit: Payer: Medicare HMO | Admitting: Gastroenterology

## 2020-09-03 DIAGNOSIS — Z8781 Personal history of (healed) traumatic fracture: Secondary | ICD-10-CM | POA: Diagnosis not present

## 2020-09-03 DIAGNOSIS — M810 Age-related osteoporosis without current pathological fracture: Secondary | ICD-10-CM | POA: Diagnosis not present

## 2020-09-03 DIAGNOSIS — R6889 Other general symptoms and signs: Secondary | ICD-10-CM | POA: Diagnosis not present

## 2020-09-03 DIAGNOSIS — F17210 Nicotine dependence, cigarettes, uncomplicated: Secondary | ICD-10-CM | POA: Diagnosis not present

## 2020-09-03 DIAGNOSIS — E559 Vitamin D deficiency, unspecified: Secondary | ICD-10-CM | POA: Diagnosis not present

## 2020-09-03 DIAGNOSIS — Z20822 Contact with and (suspected) exposure to covid-19: Secondary | ICD-10-CM | POA: Diagnosis not present

## 2020-09-09 ENCOUNTER — Encounter: Payer: Self-pay | Admitting: Gastroenterology

## 2020-09-09 ENCOUNTER — Other Ambulatory Visit: Payer: Self-pay

## 2020-09-09 ENCOUNTER — Ambulatory Visit (INDEPENDENT_AMBULATORY_CARE_PROVIDER_SITE_OTHER): Payer: Medicare HMO | Admitting: Gastroenterology

## 2020-09-09 VITALS — BP 137/77 | HR 65 | Temp 98.5°F | Ht 70.0 in | Wt 137.4 lb

## 2020-09-09 DIAGNOSIS — K9089 Other intestinal malabsorption: Secondary | ICD-10-CM

## 2020-09-09 NOTE — Progress Notes (Signed)
Cephas Darby, MD [address]  [place]  [place], [place] [address]  Main: [phone]  Fax: [phone] Pager: [phone]   Primary Care Physician: Gwyneth Sprout, FNP  Primary Gastroenterologist:  Dr. Cephas Darby  Chief Complaint  Patient presents with   Elevated Hepatic Enzymes   Diarrhea    Patient has diarrhea every 3 days. States it was every day when she had part of colon removed. States she takes a powder but does not know the name of it     HPI: Jennifer [name] is a 70 y.o. female Chronic tobacco use,was initially seen by me on 03/29/2017 for screening colonoscopy. She did not have colonoscopy before. She underwent complete colonoscopy, and found to have multiple polyps. Pathology came back as adenomas with no evidence of high-grade dysplasia or cancer.there were 2 polyps in the ascending colon, one 12 mm flat polyp which was incompletely removed after lifting. Another 25 mm flat polyp in ascending colon in close proximity to the 12 mm polyp was found, resection not attempted, area distal to the polyp was tattooed. She is here to discuss about the pathology results from recent colonoscopy. She is otherwise doing well. Denies any GI complaints today.    Follow-up visit 02/21/2020 70 year old female with history of chronic tobacco use, periodic alcohol abuse, history of multiple colon polyps, right hemicolectomy secondary to unresectable large ascending colon polyp in 06/2017.  Patient reports that since her right hemicolectomy, she has been experiencing nonbloody diarrhea, anywhere from 2-4 bowel movements daily, watery, sometimes associated with incontinence.  She denies abdominal pain, abdominal bloating or cramps.  She reports losing about 30 pounds within last 1 year.  She is single, lives alone, picked up on drinking, acknowledges drinking about 1 pint of liquor weekly.  Her transaminases are elevated based on most recent labs.  She reports her appetite is good.   She has not tried any over-the-counter medications for diarrhea.  No evidence of anemia.   Follow-up visit 04/17/2020 Patient reports that her diarrhea is intermittent.  However, continues to have postprandial urgency as well as bloating.  She is not able to gain weight.  She continues to smoke 1 pack/day.  She quit drinking alcohol.  Her weight is stable  Follow-up visit 09/09/2020 Patient is here for routine follow-up.  She reports that her diarrhea is intermittent and currently taking cholestyramine as needed.  Her weight has been stable.  Her LFTs are normal and she quit drinking alcohol.  She reports feeling well overall.  Her pancreatic fecal elastase levels also came back normal.  Current Outpatient Medications  Medication Sig Dispense Refill   alendronate (FOSAMAX) 70 MG tablet TAKE 1 TABLET BY MOUTH ONCE A WEEK. TAKE ON AN EMPTY STOMACH WITH A FULL GLASS OF WATER 12 tablet 0   atorvastatin (LIPITOR) 10 MG tablet Take 1 tablet (10 mg total) by mouth daily. 90 tablet 1   calcium carbonate (OS-CAL - DOSED IN MG OF ELEMENTAL CALCIUM) 1250 (500 Ca) MG tablet Take 1 tablet by mouth once a week.     conjugated estrogens (PREMARIN) vaginal cream Place 1 Applicatorful vaginally daily. 42.5 g 12   Vitamin D, Ergocalciferol, (DRISDOL) 1.25 MG (50000 UNIT) CAPS capsule Take 50,000 Units by mouth every 7 (seven) days.     No current facility-administered medications for this visit.    Allergies as of 09/09/2020   (No Known Allergies)    NSAIDs: none  Antiplts/Anticoagulants/Anti thrombotics: none  GI  procedures:  Colonoscopy 03/04/2020 - Perianal skin tags found on perianal exam. - Patent end-to-side ileo-colonic anastomosis, characterized by healthy appearing mucosa. - One 5 mm polyp in the transverse colon, removed with a cold snare. Resected and retrieved. - Non-bleeding external hemorrhoids. - The examined portion of the ileum was normal. - Normal mucosa in the entire examined colon.  Biopsied.  DIAGNOSIS:  A. RANDOM, COLON; COLD BIOPSY:  - COLONIC MUCOSA WITH NO SIGNIFICANT PATHOLOGIC ALTERATION.  - NEGATIVE FOR MICROSCOPIC COLITIS, DYSPLASIA, AND MALIGNANCY.   B. COLON POLYP X1, TRANSVERSE; COLD SNARE:  - TUBULAR ADENOMA.  - NEGATIVE FOR HIGH-GRADE DYSPLASIA AND MALIGNANCY.   Colonoscopy 03/29/2017 - One 7 mm polyp in the cecum, removed with a hot snare. Resected and retrieved. - One 10 mm polyp in the cecum, removed with mucosal resection. Resected and retrieved. Clip was placed. - One 10 mm polyp in the proximal ascending colon, removed with mucosal resection. Resected and retrieved. - Two 5 mm polyps in the ascending colon, removed with a cold snare. Resected and retrieved. - One 12 mm polyp in the mid ascending colon, removed with mucosal resection. Incomplete resection. Resected tissue retrieved. Clip was placed. - One 25 mm polyp in the ascending colon. Resection not attempted. Tattooed. - One 8 mm polyp in the transverse colon, removed with a hot snare. Resected and retrieved. - One 10 mm polyp in the descending colon, removed with mucosal resection. Resected and retrieved. Clip was placed. - One 8 mm polyp in the sigmoid colon, removed with a hot snare. Resected and retrieved. - The distal rectum and anal verge are normal on retroflexion view.  Pathology DIAGNOSIS:  A. COLON POLYP X 2, CECUM; HOT SNARE:  - TUBULAR ADENOMAS, 3 FRAGMENTS.  - NEGATIVE FOR HIGH-GRADE DYSPLASIA AND MALIGNANCY.   B. COLON POLYP X 3, ASCENDING; HOT SNARE (1) AND COLD SNARE (2):  - TUBULAR ADENOMAS, AT LEAST 6 FRAGMENTS.  - NEGATIVE FOR HIGH-GRADE DYSPLASIA AND MALIGNANCY.   C. COLD POLYP, ASCENDING; HOT SNARE:  - TUBULAR ADENOMA AND BLOOD CLOT.  - NEGATIVE FOR HIGH-GRADE DYSPLASIA AND MALIGNANCY.   D. COLON POLYP, TRANSVERSE; HOT SNARE:  - TUBULAR ADENOMA, 2 FRAGMENTS.  - NEGATIVE FOR HIGH-GRADE DYSPLASIA AND MALIGNANCY.   E. COLON POLYP, DESCENDING; HOT SNARE:  -  TUBULAR ADENOMA, 4 FRAGMENTS.  - NEGATIVE FOR HIGH-GRADE DYSPLASIA AND MALIGNANCY.   F. COLON POLYP, SIGMOID; HOT SNARE:  - TUBULAR ADENOMA, MULTIPLE FRAGMENTS.  - NEGATIVE FOR HIGH-GRADE DYSPLASIA AND MALIGNANCY.   She has family history of colon cancer in her grandmother, aunt and uncle  ROS:  General: Negative for anorexia, weight loss, fever, chills, fatigue, weakness. ENT: Negative for hoarseness, difficulty swallowing , nasal congestion. CV: Negative for chest pain, angina, palpitations, dyspnea on exertion, peripheral edema.  Respiratory: Negative for dyspnea at rest, dyspnea on exertion, cough, sputum, wheezing.  GI: See history of present illness. GU:  Negative for dysuria, hematuria, urinary incontinence, urinary frequency, nocturnal urination.  Endo: Negative for unusual weight change.    Physical Examination:   BP 137/77 (BP Location: Left Arm, Patient Position: Sitting, Cuff Size: Normal)   Pulse 65   Temp 98.5 F (36.9 C) (Oral)   Ht '5\' 10"'$  (1.778 m)   Wt 137 lb 6 oz (62.3 kg)   BMI 19.71 kg/m   General: Well-nourished, well-developed in no acute distress.  Eyes: No icterus. Conjunctivae pink. Mouth: Oropharyngeal mucosa moist and pink , no lesions erythema or exudate. Lungs: Clear to auscultation bilaterally.  Non-labored. Heart: Regular rate and rhythm, no murmurs rubs or gallops.  Abdomen: Bowel sounds are normal, nontender, mildly distended, tympanic to percussion, no hepatosplenomegaly or masses, no hernia , no rebound or guarding.   Extremities: No lower extremity edema. No clubbing or deformities. Neuro: Alert and oriented x 3.  Grossly intact. Skin: Warm and dry, no jaundice.   Psych: Alert and cooperative, normal mood and affect.   Imaging Studies: No results found.  Assessment and Plan:   Jennifer [name] is a 70 y.o. female with family history of colon cancer in multiple second-degree relatives, with history of chronic tobacco use, alcohol abuse,  history of unresectable ascending colon polyp s/p right hemicolectomy in 06/2017, is here for follow-up of chronic nonbloody diarrhea with weight loss that has started after right hemicolectomy.  Patient underwent colonoscopy in 02/2020, random colon biopsies were performed, no evidence of microscopic colitis  Chronic diarrhea: Currently improved Increase patient to take Questran cholestyramine half packet daily or as needed  Pancreatic fecal elastase levels were normal  Elevated LFTs: Currently normalized, etiology, AST greater than ALT, likely secondary to alcohol use Patient reports that she quit drinking alcohol HIV and HCV negative  Follow up as needed  Dr Sherri Sear, MD

## 2020-09-17 ENCOUNTER — Telehealth: Payer: Self-pay | Admitting: Family Medicine

## 2020-09-17 ENCOUNTER — Other Ambulatory Visit: Payer: Self-pay | Admitting: Family Medicine

## 2020-09-17 DIAGNOSIS — E7800 Pure hypercholesterolemia, unspecified: Secondary | ICD-10-CM

## 2020-09-17 DIAGNOSIS — M810 Age-related osteoporosis without current pathological fracture: Secondary | ICD-10-CM

## 2020-09-17 MED ORDER — ATORVASTATIN CALCIUM 10 MG PO TABS: 10.0000 mg | ORAL_TABLET | Freq: Every day | ORAL | 1 refills | Status: DC

## 2020-09-17 MED ORDER — ALENDRONATE SODIUM 70 MG PO TABS: ORAL_TABLET | ORAL | 0 refills | Status: DC

## 2020-09-17 NOTE — Telephone Encounter (Signed)
   Notes to clinic: Requested script has expired  Review for continued use and refill    Requested Prescriptions  Pending Prescriptions Disp Refills   atorvastatin (LIPITOR) 10 MG tablet 90 tablet 1    Sig: Take 1 tablet (10 mg total) by mouth daily.     Cardiovascular:  Antilipid - Statins Failed - 09/17/2020  9:40 AM      Failed - Total Cholesterol in normal range and within 360 days    Cholesterol, Total  Date Value Ref Range Status  01/31/2020 241 (H) 100 - 199 mg/dL Final          Failed - LDL in normal range and within 360 days    LDL Chol Calc (NIH)  Date Value Ref Range Status  01/31/2020 115 (H) 0 - 99 mg/dL Final          Passed - HDL in normal range and within 360 days    HDL  Date Value Ref Range Status  01/31/2020 112 >39 mg/dL Final          Passed - Triglycerides in normal range and within 360 days    Triglycerides  Date Value Ref Range Status  01/31/2020 84 0 - 149 mg/dL Final          Passed - Patient is not pregnant      Passed - Valid encounter within last 12 months    Recent Outpatient Visits           4 weeks ago Hyperlipidemia, unspecified hyperlipidemia type   [facility] Tally Joe T, FNP   3 months ago Chronic right shoulder pain   [place], Vickki Muff, PA-C   7 months ago Annual physical exam   [facility] Carles Collet M, [place]   1 year ago Closed fracture of left wrist, sequela   [place], [place], [place]   1 year ago Chest pain, unspecified type   [place], Wendee Beavers, [place]       Future Appointments             In 2 months Gwyneth Sprout, [place], [place]

## 2020-09-17 NOTE — Telephone Encounter (Signed)
[facility] faxed refill request for the following medications:   alendronate (FOSAMAX) 70 MG tablet      Please advise.

## 2020-09-17 NOTE — Telephone Encounter (Signed)
Medication Refill - Medication:  atorvastatin (LIPITOR) 10 MG tablet  Has the patient contacted their pharmacy? No.  Preferred Pharmacy (with phone number or street name):  [facility] ([facility] [place] - [place], [place] ([place])  [address]  Phone:  [phone]  Fax:  [phone]  Agent: Please be advised that RX refills may take up to 3 business days. We ask that you follow-up with your pharmacy.

## 2020-09-19 ENCOUNTER — Telehealth: Payer: Self-pay

## 2020-09-19 ENCOUNTER — Ambulatory Visit: Payer: Medicare HMO

## 2020-09-19 NOTE — Telephone Encounter (Signed)
[facility] faxed refill request for the following medications:  Vitamin D, Ergocalciferol, (DRISDOL) 1.25 MG (50000 UNIT) CAPS capsule    Please advise.

## 2020-09-20 ENCOUNTER — Other Ambulatory Visit: Payer: Self-pay | Admitting: Family Medicine

## 2020-09-20 ENCOUNTER — Telehealth: Payer: Self-pay

## 2020-09-20 NOTE — Telephone Encounter (Signed)
[facility] refer to patient message sent

## 2020-10-24 ENCOUNTER — Telehealth: Payer: Self-pay

## 2020-10-24 NOTE — Progress Notes (Signed)
Left message for patient to call back and schedule Medicare Annual Wellness Visit (AWV).    Please offer to do virtually or by telephone sometime this week    Last AWV:09/14/2019    Please schedule at anytime with [facility] Health Advisor.    If any questions, please contact me at [place], [place], [place],  [address] Direct Dial: [phone] [email or web address] Website: [email or web address]

## 2020-10-25 NOTE — Telephone Encounter (Signed)
Patient would like a call back regarding scheduling her AWV.

## 2020-10-25 NOTE — Telephone Encounter (Addendum)
Pt is returning call concerning AWV

## 2020-11-19 ENCOUNTER — Other Ambulatory Visit: Payer: Self-pay

## 2020-11-19 ENCOUNTER — Ambulatory Visit (INDEPENDENT_AMBULATORY_CARE_PROVIDER_SITE_OTHER): Payer: Medicare HMO | Admitting: Family Medicine

## 2020-11-19 ENCOUNTER — Encounter: Payer: Self-pay | Admitting: Family Medicine

## 2020-11-19 VITALS — BP 114/72 | HR 83 | Temp 97.5°F | Resp 16 | Wt 140.0 lb

## 2020-11-19 DIAGNOSIS — R002 Palpitations: Secondary | ICD-10-CM | POA: Insufficient documentation

## 2020-11-19 DIAGNOSIS — I10 Essential (primary) hypertension: Secondary | ICD-10-CM | POA: Diagnosis not present

## 2020-11-19 DIAGNOSIS — Z23 Encounter for immunization: Secondary | ICD-10-CM | POA: Diagnosis not present

## 2020-11-19 DIAGNOSIS — E785 Hyperlipidemia, unspecified: Secondary | ICD-10-CM | POA: Diagnosis not present

## 2020-11-19 DIAGNOSIS — R6889 Other general symptoms and signs: Secondary | ICD-10-CM | POA: Diagnosis not present

## 2020-11-19 DIAGNOSIS — Z720 Tobacco use: Secondary | ICD-10-CM | POA: Diagnosis not present

## 2020-11-19 DIAGNOSIS — Z1231 Encounter for screening mammogram for malignant neoplasm of breast: Secondary | ICD-10-CM | POA: Insufficient documentation

## 2020-11-19 DIAGNOSIS — E7800 Pure hypercholesterolemia, unspecified: Secondary | ICD-10-CM | POA: Diagnosis not present

## 2020-11-19 DIAGNOSIS — M810 Age-related osteoporosis without current pathological fracture: Secondary | ICD-10-CM

## 2020-11-19 DIAGNOSIS — R748 Abnormal levels of other serum enzymes: Secondary | ICD-10-CM | POA: Diagnosis not present

## 2020-11-19 DIAGNOSIS — Z716 Tobacco abuse counseling: Secondary | ICD-10-CM | POA: Insufficient documentation

## 2020-11-19 DIAGNOSIS — E876 Hypokalemia: Secondary | ICD-10-CM | POA: Diagnosis not present

## 2020-11-19 DIAGNOSIS — I700 Atherosclerosis of aorta: Secondary | ICD-10-CM

## 2020-11-19 NOTE — Assessment & Plan Note (Signed)
Due for screening exam; did not want formal breast exam today

## 2020-11-19 NOTE — Assessment & Plan Note (Signed)
Remains on statin; no complications with use

## 2020-11-19 NOTE — Progress Notes (Signed)
Established patient visit   Patient: Jennifer [name]   DOB: 1950-09-08   70 y.o. Female  MRN: [id number] Visit Date: 11/19/2020  Today's healthcare provider: Gwyneth Sprout, FNP   Chief Complaint  Patient presents with   Hypertension   Hyperlipidemia   Subjective    HPI  Hypertension, follow-up  BP Readings from Last 3 Encounters:  11/19/20 114/72  09/09/20 137/77  08/19/20 139/79   Wt Readings from Last 3 Encounters:  11/19/20 140 lb (63.5 kg)  09/09/20 137 lb 6 oz (62.3 kg)  08/19/20 137 lb 6.4 oz (62.3 kg)     She was last seen for hypertension 3 months ago.  BP at that visit was 139/79. Management since that visit includes none. She is following a Regular diet. She is exercising. She does smoke.  Use of agents associated with hypertension: none.   Outside blood pressures are not being checked. Symptoms: Yes chest pain No chest pressure  Yes palpitations No syncope  No dyspnea No orthopnea  No paroxysmal nocturnal dyspnea No lower extremity edema   Pertinent labs: Lab Results  Component Value Date   CHOL 241 (H) 01/31/2020   HDL 112 01/31/2020   LDLCALC 115 (H) 01/31/2020   TRIG 84 01/31/2020   CHOLHDL 2.2 01/31/2020   Lab Results  Component Value Date   NA 140 01/31/2020   K 3.3 (L) 01/31/2020   CREATININE 0.82 01/31/2020   GFRNONAA 74 01/31/2020   GLUCOSE 82 01/31/2020   TSH 1.800 01/31/2020     The ASCVD Risk score (Arnett DK, et al., 2019) failed to calculate for the following reasons:   The valid HDL cholesterol range is 20 to 100 mg/dL   ---------------------------------------------------------------------------------------------------  Lipid/Cholesterol, Follow-up  Last lipid panel Other pertinent labs  Lab Results  Component Value Date   CHOL 241 (H) 01/31/2020   HDL 112 01/31/2020   LDLCALC 115 (H) 01/31/2020   TRIG 84 01/31/2020   CHOLHDL 2.2 01/31/2020   Lab Results  Component Value Date   ALT 15 04/17/2020   AST 21  04/17/2020   PLT 162 01/31/2020   TSH 1.800 01/31/2020     She was last seen for this 3 months ago.  Management since that visit includes none continue Statin.  She reports excellent compliance with treatment. She is not having side effects.   Symptoms: Yes chest pain No chest pressure/discomfort  No dyspnea No lower extremity edema  No numbness or tingling of extremity No orthopnea  No palpitations No paroxysmal nocturnal dyspnea  No speech difficulty No syncope   Current diet: well balanced Current exercise: not asked  The ASCVD Risk score (Arnett DK, et al., 2019) failed to calculate for the following reasons:   The valid HDL cholesterol range is 20 to 100 mg/dL  ---------------------------------------------------------------------------------------------------   Medications: Outpatient Medications Prior to Visit  Medication Sig   alendronate (FOSAMAX) 70 MG tablet TAKE 1 TABLET BY MOUTH ONCE A WEEK. TAKE ON AN EMPTY STOMACH WITH A FULL GLASS OF WATER   atorvastatin (LIPITOR) 10 MG tablet Take 1 tablet (10 mg total) by mouth daily.   calcium carbonate (OS-CAL - DOSED IN MG OF ELEMENTAL CALCIUM) 1250 (500 Ca) MG tablet Take 1 tablet by mouth once a week.   conjugated estrogens (PREMARIN) vaginal cream Place 1 Applicatorful vaginally daily.   Vitamin D, Ergocalciferol, (DRISDOL) 1.25 MG (50000 UNIT) CAPS capsule Take 50,000 Units by mouth every 7 (seven) days.   No facility-administered  medications prior to visit.    Review of Systems     Objective    BP 114/72   Pulse 83   Temp (!) 97.5 F (36.4 C) (Oral)   Resp 16   Wt 140 lb (63.5 kg)   BMI 20.09 kg/m  {Show previous vital signs (optional):23777}  Physical Exam Vitals and nursing note reviewed.  Constitutional:      General: She is not in acute distress.    Appearance: Normal appearance. She is overweight. She is not ill-appearing, toxic-appearing or diaphoretic.  HENT:     Head: Normocephalic and  atraumatic.  Cardiovascular:     Rate and Rhythm: Normal rate and regular rhythm.     Pulses: Normal pulses.     Heart sounds: Normal heart sounds. No murmur heard.   No friction rub. No gallop.  Pulmonary:     Effort: Pulmonary effort is normal. No respiratory distress.     Breath sounds: Normal breath sounds. No stridor. No wheezing, rhonchi or rales.  Chest:     Chest wall: No tenderness.  Abdominal:     General: Bowel sounds are normal.     Palpations: Abdomen is soft.  Musculoskeletal:        General: No swelling, tenderness, deformity or signs of injury. Normal range of motion.     Right lower leg: No edema.     Left lower leg: No edema.  Skin:    General: Skin is warm and dry.     Capillary Refill: Capillary refill takes less than 2 seconds.     Coloration: Skin is not jaundiced or pale.     Findings: No bruising, erythema, lesion or rash.  Neurological:     General: No focal deficit present.     Mental Status: She is alert and oriented to person, place, and time. Mental status is at baseline.     Cranial Nerves: No cranial nerve deficit.     Sensory: No sensory deficit.     Motor: No weakness.     Coordination: Coordination normal.  Psychiatric:        Mood and Affect: Mood normal.        Behavior: Behavior normal.        Thought Content: Thought content normal.        Judgment: Judgment normal.     No results found for any visits on 11/19/20.  Assessment & Plan     Problem List Items Addressed This Visit       Cardiovascular and Mediastinum   Primary hypertension    Chronic, stable Not on medications DASH diet encouraged      Relevant Orders   CBC with Differential/Platelet   RESOLVED: Aortic atherosclerosis ([place])     Musculoskeletal and Integument   Osteoporosis    On most recent DEXA scan; continue vit D supplement Recommend weight bearing exercise        Other   Tobacco abuse    Smoking 1ppd      Hyperlipidemia - Primary    recommend  diet low in saturated fat and regular exercise - 30 min at least 5 times per week       Relevant Orders   Lipid panel   Encounter for tobacco use cessation counseling    Has funds to get the patch from insurance- encouraged to use 14mg  patch to allow some nicotine with use of occ cigarette and prevent increase in nicotine consumption      Hypercholesterolemia  Remains on statin; no complications with use      Elevated liver enzymes    Repeat chemistry      Relevant Orders   Comprehensive metabolic panel   Flu vaccine need    Provided today      Relevant Orders   Flu Vaccine QUAD High Dose(Fluad) (Completed)   Encounter for screening mammogram for malignant neoplasm of breast    Due for screening exam; did not want formal breast exam today      Relevant Orders   MM 3D SCREEN BREAST BILATERAL   Hypokalemia    Intermittent low; not on diuretic, normal kidney function repeat      Relevant Orders   Comprehensive metabolic panel   Palpitations    Check thyroid levels      Relevant Orders   T4, free   TSH     Return in about 6 months (around 05/20/2021) for annual examination, chonic disease management.      Vonna Kotyk, FNP, have reviewed all documentation for this visit. The documentation on 11/19/20 for the exam, diagnosis, procedures, and orders are all accurate and complete.    Gwyneth Sprout, [place] [phone] (phone) [phone] (fax)  [place]

## 2020-11-19 NOTE — Assessment & Plan Note (Signed)
Check thyroid levels

## 2020-11-19 NOTE — Assessment & Plan Note (Signed)
Smoking: 1ppd 

## 2020-11-19 NOTE — Assessment & Plan Note (Signed)
recommend diet low in saturated fat and regular exercise - 30 min at least 5 times per week

## 2020-11-19 NOTE — Assessment & Plan Note (Signed)
Has funds to get the patch from insurance- encouraged to use 14mg  patch to allow some nicotine with use of occ cigarette and prevent increase in nicotine consumption

## 2020-11-19 NOTE — Assessment & Plan Note (Signed)
Provided today 

## 2020-11-19 NOTE — Assessment & Plan Note (Signed)
Chronic, stable Not on medications DASH diet encouraged

## 2020-11-19 NOTE — Assessment & Plan Note (Signed)
Repeat chemistry

## 2020-11-19 NOTE — Assessment & Plan Note (Signed)
On most recent DEXA scan; continue vit D supplement Recommend weight bearing exercise

## 2020-11-19 NOTE — Assessment & Plan Note (Signed)
Intermittent low; not on diuretic, normal kidney function repeat

## 2020-11-20 LAB — CBC WITH DIFFERENTIAL/PLATELET
Basophils Absolute: 0.1 10*3/uL (ref 0.0–0.2)
Basos: 2 %
EOS (ABSOLUTE): 0.2 10*3/uL (ref 0.0–0.4)
Eos: 2 %
Hematocrit: 41.1 % (ref 34.0–46.6)
Hemoglobin: 13.8 g/dL (ref 11.1–15.9)
Immature Grans (Abs): 0 10*3/uL (ref 0.0–0.1)
Immature Granulocytes: 0 %
Lymphocytes Absolute: 2.7 10*3/uL (ref 0.7–3.1)
Lymphs: 34 %
MCH: 30.4 pg (ref 26.6–33.0)
MCHC: 33.6 g/dL (ref 31.5–35.7)
MCV: 91 fL (ref 79–97)
Monocytes Absolute: 0.7 10*3/uL (ref 0.1–0.9)
Monocytes: 9 %
Neutrophils Absolute: 4.2 10*3/uL (ref 1.4–7.0)
Neutrophils: 53 %
Platelets: 207 10*3/uL (ref 150–450)
RBC: 4.54 x10E6/uL (ref 3.77–5.28)
RDW: 13.4 % (ref 11.7–15.4)
WBC: 7.9 10*3/uL (ref 3.4–10.8)

## 2020-11-20 LAB — T4, FREE: Free T4: 1.15 ng/dL (ref 0.82–1.77)

## 2020-11-20 LAB — LIPID PANEL
Chol/HDL Ratio: 2.3 ratio (ref 0.0–4.4)
Cholesterol, Total: 194 mg/dL (ref 100–199)
HDL: 84 mg/dL (ref >39)
LDL Chol Calc (NIH): 92 mg/dL (ref 0–99)
Triglycerides: 103 mg/dL (ref 0–149)
VLDL Cholesterol Cal: 18 mg/dL (ref 5–40)

## 2020-11-20 LAB — COMPREHENSIVE METABOLIC PANEL
ALT: 15 IU/L (ref 0–32)
AST: 23 IU/L (ref 0–40)
Albumin/Globulin Ratio: 2.5 — ABNORMAL HIGH (ref 1.2–2.2)
Albumin: 4.7 g/dL (ref 3.8–4.8)
Alkaline Phosphatase: 68 IU/L (ref 44–121)
BUN/Creatinine Ratio: 10 — ABNORMAL LOW (ref 12–28)
BUN: 9 mg/dL (ref 8–27)
Bilirubin Total: 0.3 mg/dL (ref 0.0–1.2)
CO2: 22 mmol/L (ref 20–29)
Calcium: 9.6 mg/dL (ref 8.7–10.3)
Chloride: 103 mmol/L (ref 96–106)
Creatinine, Ser: 0.86 mg/dL (ref 0.57–1.00)
Globulin, Total: 1.9 g/dL (ref 1.5–4.5)
Glucose: 79 mg/dL (ref 70–99)
Potassium: 4.3 mmol/L (ref 3.5–5.2)
Sodium: 140 mmol/L (ref 134–144)
Total Protein: 6.6 g/dL (ref 6.0–8.5)
eGFR: 74 mL/min/{1.73_m2} (ref >59)

## 2020-11-20 LAB — TSH: TSH: 2.52 u[IU]/mL (ref 0.450–4.500)

## 2020-11-21 ENCOUNTER — Telehealth: Payer: Self-pay | Admitting: Family Medicine

## 2020-11-21 ENCOUNTER — Encounter: Payer: Self-pay | Admitting: Family Medicine

## 2020-11-21 ENCOUNTER — Other Ambulatory Visit: Payer: Self-pay | Admitting: Family Medicine

## 2020-11-21 DIAGNOSIS — N6489 Other specified disorders of breast: Secondary | ICD-10-CM

## 2020-11-21 DIAGNOSIS — Z1231 Encounter for screening mammogram for malignant neoplasm of breast: Secondary | ICD-10-CM

## 2020-11-21 NOTE — Telephone Encounter (Signed)
Patient states when calling to schedule mammogram appt was told that Dr Rollene Rotunda has to order a bilateral and ultrasound before she n make the appt. Please all back

## 2020-12-01 ENCOUNTER — Other Ambulatory Visit: Payer: Self-pay | Admitting: Family Medicine

## 2020-12-01 DIAGNOSIS — M810 Age-related osteoporosis without current pathological fracture: Secondary | ICD-10-CM

## 2020-12-01 DIAGNOSIS — E7800 Pure hypercholesterolemia, unspecified: Secondary | ICD-10-CM

## 2020-12-01 NOTE — Telephone Encounter (Signed)
Requested Prescriptions  Pending Prescriptions Disp Refills  . alendronate (FOSAMAX) 70 MG tablet [Pharmacy Med Name: ALENDRONATE SODIUM 70 MG Tablet] 12 tablet     Sig: TAKE 1 TABLET EVERY WEEK  ON  AN  EMPTY  STOMACH WITH A FULL GLASS OF WATER     Endocrinology:  Bisphosphonates Failed - 12/01/2020  4:00 AM      Failed - Vitamin D in normal range and within 360 days    No results found for: AC1660YT0, ZS0109NA3, [id number], 25OHVITD3, 25OHVITD2, 25OHVITD3, 25OHVITD2, 25OHVITD1, 25OHVITD2, 25OHVITD3, VD25OH       Passed - Ca in normal range and within 360 days    Calcium  Date Value Ref Range Status  11/19/2020 9.6 8.7 - 10.3 mg/dL Final         Passed - Valid encounter within last 12 months    Recent Outpatient Visits          1 week ago Hyperlipidemia, unspecified hyperlipidemia type   [facility] Gwyneth Sprout, FNP   3 months ago Hyperlipidemia, unspecified hyperlipidemia type   [facility] Tally Joe T, FNP   6 months ago Chronic right shoulder pain   [facility], Vickki Muff, PA-C   10 months ago Annual physical exam   [facility] Carles Collet M, [place]   1 year ago Closed fracture of left wrist, sequela   [facility] [place], Adriana M, PA-C             . atorvastatin (LIPITOR) 10 MG tablet [Pharmacy Med Name: ATORVASTATIN CALCIUM 10 MG Tablet] 90 tablet 2    Sig: TAKE 1 TABLET EVERY DAY     Cardiovascular:  Antilipid - Statins Passed - 12/01/2020  4:00 AM      Passed - Total Cholesterol in normal range and within 360 days    Cholesterol, Total  Date Value Ref Range Status  11/19/2020 194 100 - 199 mg/dL Final         Passed - LDL in normal range and within 360 days    LDL Chol Calc (NIH)  Date Value Ref Range Status  11/19/2020 92 0 - 99 mg/dL Final         Passed - HDL in normal range and within 360 days    HDL  Date Value Ref Range Status  11/19/2020 84 >39 mg/dL Final          Passed - Triglycerides in normal range and within 360 days    Triglycerides  Date Value Ref Range Status  11/19/2020 103 0 - 149 mg/dL Final         Passed - Patient is not pregnant      Passed - Valid encounter within last 12 months    Recent Outpatient Visits          1 week ago Hyperlipidemia, unspecified hyperlipidemia type   [facility] Tally Joe T, FNP   3 months ago Hyperlipidemia, unspecified hyperlipidemia type   [facility] Tally Joe T, FNP   6 months ago Chronic right shoulder pain   [place], Vickki Muff, PA-C   10 months ago Annual physical exam   [facility] Carles Collet M, [place]   1 year ago Closed fracture of left wrist, sequela   [facility] [place], [place], [place]

## 2020-12-01 NOTE — Telephone Encounter (Signed)
Requested medication (s) are due for refill today: yes  Requested medication (s) are on the active medication list: yes  Last refill:  09/17/20 #12  Future visit scheduled: no  Notes to clinic:  overdue lab work   Requested Prescriptions  Pending Prescriptions Disp Refills   alendronate (FOSAMAX) 70 MG tablet [Pharmacy Med Name: ALENDRONATE SODIUM 70 MG Tablet] 12 tablet     Sig: TAKE 1 TABLET EVERY WEEK  ON  AN  EMPTY  STOMACH WITH A FULL GLASS OF WATER     Endocrinology:  Bisphosphonates Failed - 12/01/2020  4:00 AM      Failed - Vitamin D in normal range and within 360 days    No results found for: [id number], [id number], [id number], 25OHVITD3, 25OHVITD2, 25OHVITD3, 25OHVITD2, 25OHVITD1, 25OHVITD2, 25OHVITD3, VD25OH        Passed - Ca in normal range and within 360 days    Calcium  Date Value Ref Range Status  11/19/2020 9.6 8.7 - 10.3 mg/dL Final          Passed - Valid encounter within last 12 months    Recent Outpatient Visits           1 week ago Hyperlipidemia, unspecified hyperlipidemia type   [facility] Tally Joe T, FNP   3 months ago Hyperlipidemia, unspecified hyperlipidemia type   [facility] Tally Joe T, FNP   6 months ago Chronic right shoulder pain   [employer], Vickki Muff, PA-C   10 months ago Annual physical exam   [facility] Carles Collet M, [place]   1 year ago Closed fracture of left wrist, sequela   [place], Adriana M, [place]              Signed Prescriptions Disp Refills   atorvastatin (LIPITOR) 10 MG tablet 90 tablet 2    Sig: TAKE 1 TABLET EVERY DAY     Cardiovascular:  Antilipid - Statins Passed - 12/01/2020  4:00 AM      Passed - Total Cholesterol in normal range and within 360 days    Cholesterol, Total  Date Value Ref Range Status  11/19/2020 194 100 - 199 mg/dL Final          Passed - LDL in normal range and within 360 days    LDL  Chol Calc (NIH)  Date Value Ref Range Status  11/19/2020 92 0 - 99 mg/dL Final          Passed - HDL in normal range and within 360 days    HDL  Date Value Ref Range Status  11/19/2020 84 >39 mg/dL Final          Passed - Triglycerides in normal range and within 360 days    Triglycerides  Date Value Ref Range Status  11/19/2020 103 0 - 149 mg/dL Final          Passed - Patient is not pregnant      Passed - Valid encounter within last 12 months    Recent Outpatient Visits           1 week ago Hyperlipidemia, unspecified hyperlipidemia type   [facility] Tally Joe T, FNP   3 months ago Hyperlipidemia, unspecified hyperlipidemia type   [facility] Tally Joe T, FNP   6 months ago Chronic right shoulder pain   [place], Vickki Muff, PA-C   10 months ago Annual physical exam   [facility] Trinna Post, PA-C   1 year ago Closed fracture of left wrist, sequela   [facility] Carles Collet [place], [place]

## 2020-12-04 ENCOUNTER — Ambulatory Visit
Admission: RE | Admit: 2020-12-04 | Discharge: 2020-12-04 | Disposition: A | Discharge status: Home / Self Care | Payer: Medicare HMO | Source: Ambulatory Visit | Attending: Family Medicine | Admitting: Family Medicine

## 2020-12-04 ENCOUNTER — Other Ambulatory Visit: Payer: Self-pay

## 2020-12-04 DIAGNOSIS — N6489 Other specified disorders of breast: Secondary | ICD-10-CM

## 2020-12-04 DIAGNOSIS — R6889 Other general symptoms and signs: Secondary | ICD-10-CM | POA: Diagnosis not present

## 2020-12-04 DIAGNOSIS — R922 Inconclusive mammogram: Secondary | ICD-10-CM | POA: Diagnosis not present

## 2020-12-25 NOTE — Telephone Encounter (Signed)
Prescription filled on 12/06/20

## 2021-04-01 DIAGNOSIS — J069 Acute upper respiratory infection, unspecified: Secondary | ICD-10-CM | POA: Diagnosis not present

## 2021-04-04 ENCOUNTER — Other Ambulatory Visit: Payer: Self-pay | Admitting: *Deleted

## 2021-04-04 ENCOUNTER — Telehealth: Payer: Self-pay | Admitting: Acute Care

## 2021-04-04 DIAGNOSIS — R6889 Other general symptoms and signs: Secondary | ICD-10-CM | POA: Diagnosis not present

## 2021-04-04 DIAGNOSIS — Z87891 Personal history of nicotine dependence: Secondary | ICD-10-CM

## 2021-04-04 DIAGNOSIS — F17210 Nicotine dependence, cigarettes, uncomplicated: Secondary | ICD-10-CM | POA: Diagnosis not present

## 2021-04-04 DIAGNOSIS — E559 Vitamin D deficiency, unspecified: Secondary | ICD-10-CM | POA: Diagnosis not present

## 2021-04-04 DIAGNOSIS — M810 Age-related osteoporosis without current pathological fracture: Secondary | ICD-10-CM | POA: Diagnosis not present

## 2021-04-04 NOTE — Telephone Encounter (Signed)
Attempted to schedule annual LDCT.  LVMM. ?

## 2021-04-09 ENCOUNTER — Encounter: Payer: Self-pay | Admitting: Family Medicine

## 2021-04-09 ENCOUNTER — Telehealth (INDEPENDENT_AMBULATORY_CARE_PROVIDER_SITE_OTHER): Payer: Medicare HMO | Admitting: Family Medicine

## 2021-04-09 ENCOUNTER — Other Ambulatory Visit: Payer: Self-pay

## 2021-04-09 DIAGNOSIS — J301 Allergic rhinitis due to pollen: Secondary | ICD-10-CM | POA: Insufficient documentation

## 2021-04-09 DIAGNOSIS — J0140 Acute pansinusitis, unspecified: Secondary | ICD-10-CM | POA: Insufficient documentation

## 2021-04-09 MED ORDER — AZITHROMYCIN 500 MG PO TABS: 500.0000 mg | ORAL_TABLET | Freq: Every day | ORAL | 0 refills | Status: DC

## 2021-04-09 MED ORDER — CETIRIZINE HCL 10 MG PO TABS: 10.0000 mg | ORAL_TABLET | Freq: Every day | ORAL | 3 refills | Status: DC

## 2021-04-09 NOTE — Assessment & Plan Note (Addendum)
Right sided sinusitis, frontal and maxillary ?Present for >1 week ?Patient "cannot take OTC medications due to side effects" ?Complaints of acute symptoms ?Productive sputum ?Complaints of R side nasal congestion ?Denies fevers ?Endorses PND with associated bronchospasm ?Endorses acute wheeze; however, chronic smoking hx- not able to appreciate over nature of phone call ?Tx with Abx, Zithromax 500 mg x 5 days with anti-histamine ?

## 2021-04-09 NOTE — Assessment & Plan Note (Signed)
Recommend use of zyrtec to prevent further sinusitis  ?

## 2021-04-09 NOTE — Progress Notes (Signed)
? ?I,Jennifer [name],acting as a scribe for Gwyneth Sprout, FNP.,have documented all relevant documentation on the behalf of Gwyneth Sprout, FNP,as directed by  Gwyneth Sprout, FNP while in the presence of Gwyneth Sprout, FNP. ? ?Virtual Telephone Visit ? ? ? ?Virtual Visit via Telephone Note  ? ?This visit type was conducted due to national recommendations for restrictions regarding the COVID-19 Pandemic (e.g. social distancing) in an effort to limit this patient's exposure and mitigate transmission in our community. This patient is at least at moderate risk for complications without adequate follow up. This format is felt to be most appropriate for this patient at this time. Physical exam was limited by what patient has described via telephone conversation alone. ? ?Patient location: home ?Provider location: [facility] ?[address] ?[place], [place] [address] ? ? ?I discussed the limitations of evaluation and management by telemedicine and the availability of in person appointments. The patient expressed understanding and agreed to proceed. ? ?Patient: Jennifer [name]   DOB: 09-Feb-1950   71 y.o. Female  MRN: [id number] ?Visit Date: 04/09/2021 ? ?Today's healthcare provider: Gwyneth Sprout, FNP  ?Re Introduced to nurse practitioner role and practice setting.  All questions answered.  Discussed provider/patient relationship and expectations. ? ? ?Chief Complaint  ?Patient presents with  ? URI  ? ?Subjective  ?  ?HPI  ?Upper respiratory symptoms ?She complains of congestion, cough described as productive of yellow sputum, nasal congestion, no  fever, post nasal drip, productive cough with  yellow colored sputum, sinus pressure, and wheezing.with no fever, chills, night sweats or weight loss. Onset of symptoms was a few days ago and worsening.She is drinking plenty of fluids.  Past history is significant for no history of pneumonia or bronchitis. Patient is smoker  (1 ppd x many  yrs) ? ?--------------------------------------------------------------------------------------------------- ? ? ? ?Medications: ?Outpatient Medications Prior to Visit  ?Medication Sig  ? alendronate (FOSAMAX) 70 MG tablet TAKE 1 TABLET EVERY WEEK  ON  AN  EMPTY  STOMACH WITH A FULL GLASS OF WATER  ? atorvastatin (LIPITOR) 10 MG tablet TAKE 1 TABLET EVERY DAY  ? calcium carbonate (OS-CAL - DOSED IN MG OF ELEMENTAL CALCIUM) 1250 (500 Ca) MG tablet Take 1 tablet by mouth once a week.  ? conjugated estrogens (PREMARIN) vaginal cream Place 1 Applicatorful vaginally daily.  ? Vitamin D, Ergocalciferol, (DRISDOL) 1.25 MG (50000 UNIT) CAPS capsule Take 50,000 Units by mouth every 7 (seven) days.  ? ?No facility-administered medications prior to visit.  ? ? ?Review of Systems  ?Constitutional:  Positive for activity change. Negative for fever.  ?HENT:  Positive for congestion, rhinorrhea, sinus pressure, sinus pain and sneezing.   ?Respiratory:  Positive for cough and wheezing.   ? ? ? Objective  ?  ?There were no vitals taken for this visit. ? ? ?Physical Exam  ? ? ? Assessment & Plan  ?  ? ?Problem List Items Addressed This Visit   ? ?  ? Respiratory  ? Acute non-recurrent pansinusitis - Primary  ?  Right sided sinusitis, frontal and maxillary ?Present for >1 week ?Patient "cannot take OTC medications due to side effects" ?Complaints of acute symptoms ?Productive sputum ?Complaints of R side nasal congestion ?Denies fevers ?Endorses PND with associated bronchospasm ?Endorses acute wheeze; however, chronic smoking hx- not able to appreciate over nature of phone call ?Tx with Abx, Zithromax 500 mg x 5 days with anti-histamine ?  ?  ? Relevant Medications  ?  cetirizine (ZYRTEC) 10 MG tablet  ? azithromycin (ZITHROMAX) 500 MG tablet  ? Seasonal allergic rhinitis due to pollen  ?  Recommend use of zyrtec to prevent further sinusitis  ?  ?  ? Relevant Medications  ? cetirizine (ZYRTEC) 10 MG tablet  ? ? ?Return if symptoms  worsen or fail to improve.  ?  ? ?I discussed the assessment and treatment plan with the patient. The patient was provided an opportunity to ask questions and all were answered. The patient agreed with the plan and demonstrated an understanding of the instructions. ?  ?The patient was advised to call back or seek an in-person evaluation if the symptoms worsen or if the condition fails to improve as anticipated. ? ?I provided 9 minutes of non-face-to-face addressing, planning, and treating sinusitis during this encounter. ? ?I, Gwyneth Sprout, FNP, have reviewed all documentation for this visit. The documentation on 04/09/21 for the exam, diagnosis, procedures, and orders are all accurate and complete. ? ?Gwyneth Sprout, FNP ?[place] ?[phone] (phone) ?[phone] (fax) ? ?[facility]  ?

## 2021-04-15 ENCOUNTER — Ambulatory Visit: Payer: Medicare HMO

## 2021-04-18 ENCOUNTER — Ambulatory Visit
Admission: RE | Admit: 2021-04-18 | Discharge: 2021-04-18 | Disposition: A | Discharge status: Home / Self Care | Payer: Medicare HMO | Source: Ambulatory Visit | Attending: Acute Care | Admitting: Acute Care

## 2021-04-18 ENCOUNTER — Other Ambulatory Visit: Payer: Self-pay

## 2021-04-18 DIAGNOSIS — Z87891 Personal history of nicotine dependence: Secondary | ICD-10-CM | POA: Diagnosis not present

## 2021-04-18 DIAGNOSIS — F17210 Nicotine dependence, cigarettes, uncomplicated: Secondary | ICD-10-CM | POA: Insufficient documentation

## 2021-04-18 DIAGNOSIS — R6889 Other general symptoms and signs: Secondary | ICD-10-CM | POA: Diagnosis not present

## 2021-04-22 ENCOUNTER — Telehealth: Payer: Self-pay

## 2021-04-22 ENCOUNTER — Other Ambulatory Visit: Payer: Self-pay

## 2021-04-22 DIAGNOSIS — F17210 Nicotine dependence, cigarettes, uncomplicated: Secondary | ICD-10-CM

## 2021-04-22 DIAGNOSIS — Z87891 Personal history of nicotine dependence: Secondary | ICD-10-CM

## 2021-04-22 NOTE — Telephone Encounter (Signed)
Lmtcb okay for [facility] nurse to advise patient of PCP message below. KW ?

## 2021-04-22 NOTE — Telephone Encounter (Signed)
Copied from [place] [id number]. Topic: General - Inquiry ?>> Apr 22, 2021  8:37 AM Jennifer [name] wrote: ?Reason for CRM: Pt called about her Lung Scan result / she saw that there was some scaring and asked if she should be concern / pt would like a call to go over result / please advise ?

## 2021-04-23 NOTE — Telephone Encounter (Signed)
Patient called and given information per Tally Joe, FNP on 04/22/21 below, patient verbalized understanding. ?

## 2021-05-12 ENCOUNTER — Encounter: Payer: Self-pay | Admitting: Family Medicine

## 2021-05-12 ENCOUNTER — Ambulatory Visit (INDEPENDENT_AMBULATORY_CARE_PROVIDER_SITE_OTHER): Payer: Medicare HMO | Admitting: Family Medicine

## 2021-05-12 VITALS — BP 127/81 | HR 68 | Temp 98.4°F | Resp 16 | Ht 69.5 in | Wt 157.6 lb

## 2021-05-12 DIAGNOSIS — F339 Major depressive disorder, recurrent, unspecified: Secondary | ICD-10-CM | POA: Diagnosis not present

## 2021-05-12 DIAGNOSIS — H9311 Tinnitus, right ear: Secondary | ICD-10-CM | POA: Diagnosis not present

## 2021-05-12 DIAGNOSIS — J432 Centrilobular emphysema: Secondary | ICD-10-CM | POA: Insufficient documentation

## 2021-05-12 DIAGNOSIS — Z0000 Encounter for general adult medical examination without abnormal findings: Secondary | ICD-10-CM | POA: Insufficient documentation

## 2021-05-12 DIAGNOSIS — F1021 Alcohol dependence, in remission: Secondary | ICD-10-CM | POA: Insufficient documentation

## 2021-05-12 DIAGNOSIS — Z1231 Encounter for screening mammogram for malignant neoplasm of breast: Secondary | ICD-10-CM

## 2021-05-12 DIAGNOSIS — Z1283 Encounter for screening for malignant neoplasm of skin: Secondary | ICD-10-CM | POA: Diagnosis not present

## 2021-05-12 MED ORDER — TRIAMTERENE-HCTZ 37.5-25 MG PO TABS: 1.0000 | ORAL_TABLET | Freq: Every day | ORAL | 1 refills | Status: DC

## 2021-05-12 MED ORDER — TRELEGY ELLIPTA 100-62.5-25 MCG/ACT IN AEPB: 1.0000 | INHALATION_SPRAY | Freq: Every day | RESPIRATORY_TRACT | 11 refills | Status: DC

## 2021-05-12 NOTE — Assessment & Plan Note (Signed)
Chronic, bothersome/stable ?Request for trial of treatment ?Will start on diuretic ?Denies loss of hearing ?

## 2021-05-12 NOTE — Progress Notes (Signed)
? ?[employer] as a Education administrator for Gwyneth Sprout, FNP.,have documented all relevant documentation on the behalf of Gwyneth Sprout, FNP,as directed by  Gwyneth Sprout, FNP while in the presence of Gwyneth Sprout, FNP.  ? ?Complete physical exam ? ? ?Patient: Jennifer [name]   DOB: 1950-05-21   71 y.o. Female  MRN: [id number] ?Visit Date: 05/12/2021 ? ?Today's healthcare provider: Gwyneth Sprout, FNP  ? ?Re Introduced to nurse practitioner role and practice setting.  All questions answered.  Discussed provider/patient relationship and expectations. ? ? ?Chief Complaint  ?Patient presents with  ? Annual Exam  ? ?Subjective  ?  ? ?HPI  ?Jennifer [name] is a 71 y.o. female who presents today for a complete physical exam.  ?She reports consuming a general diet. Home exercise routine includes cardio. She generally feels fairly well. She reports sleeping well. She does have additional problems to discuss today, patient has complaints of chest congestion and sinus symptoms >7 days. ? ?Last Reported ? ?Mammo-12/04/20 ?Colonoscopy-03/04/20 ? ?Past Medical History:  ?Diagnosis Date  ? Arthritis   ? mild  ? Cancer [facility])   ? skin cancer leg and chest  ? Dysrhythmia   ? palpitation  ? Family history of colon cancer   ? Headache   ? as  a child   ? Heart murmur   ? while pregnant  ? Hyperlipidemia 07/17/2017  ? Tobacco abuse   ? ?Past Surgical History:  ?Procedure Laterality Date  ? ABDOMINAL HYSTERECTOMY    ? 1996  ? COLONOSCOPY WITH PROPOFOL N/A 03/29/2017  ? Procedure: COLONOSCOPY WITH PROPOFOL;  Surgeon: Lin Landsman, MD;  Location: [facility] ENDOSCOPY;  Service: Gastroenterology;  Laterality: N/A;  ? COLONOSCOPY WITH PROPOFOL N/A 03/04/2020  ? Procedure: COLONOSCOPY WITH PROPOFOL;  Surgeon: Lin Landsman, MD;  Location: [facility] ENDOSCOPY;  Service: Gastroenterology;  Laterality: N/A;  ? HEMICOLECTOMY    ? righ  [id number]  ? LAPAROSCOPIC RIGHT HEMI COLECTOMY Right 07/16/2017  ? Procedure: LAPAROSCOPIC RIGHT HEMI COLECTOMY;   Surgeon: Ileana Roup, MD;  Location: [facility] ORS;  Service: General;  Laterality: Right;  ? TUBAL LIGATION    ? ?Social History  ? ?Socioeconomic History  ? Marital status: Divorced  ?  Spouse name: Not on file  ? Number of children: 1  ? Years of education: Not on file  ? Highest education level: 10th grade  ?Occupational History  ? Occupation: retired  ?Tobacco Use  ? Smoking status: Every Day  ?  Packs/day: 1.00  ?  Years: 46.35  ?  Pack years: 46.35  ?  Types: Cigarettes  ? Smokeless tobacco: Never  ?Vaping Use  ? Vaping Use: Never used  ?Substance and Sexual Activity  ? Alcohol use: Not Currently  ? Drug use: No  ? Sexual activity: Not Currently  ?Other Topics Concern  ? Not on file  ?Social History Narrative  ? Not on file  ? ?Social Determinants of Health  ? ?Financial Resource Strain: Not on file  ?Food Insecurity: Not on file  ?Transportation Needs: Not on file  ?Physical Activity: Not on file  ?Stress: Not on file  ?Social Connections: Not on file  ?Intimate Partner Violence: Not on file  ? ?Family Status  ?Relation Name Status  ? Mother  Deceased  ? Father  Deceased at age 72  ? Son  Deceased  ? Ethlyn Daniels  Deceased at age 69  ? Annamarie Major  Deceased  ? MGM  Deceased  ? MGF  Deceased  ? PGM  Deceased  ? Neg Hx  (Not Specified)  ? ?Family History  ?Problem Relation Age of Onset  ? Diabetes Mother 8  ?     12/2020  ? Kidney disease Mother   ? Heart attack Father   ? Colon cancer Paternal Aunt 105  ? Colon cancer Paternal Uncle   ?     dx >50, colon completely removed  ? Diabetes Maternal Grandmother   ? Colon cancer Maternal Grandfather   ? Heart disease Paternal Grandmother   ? Breast cancer Neg Hx   ? ?No Known Allergies  ?Patient Care Team: ?Gwyneth Sprout, FNP as PCP - General (Family Medicine) ?[place], Vonna Kotyk, [place] as Counselor (Licensed Holiday representative) ?Estill Cotta, MD (Ophthalmology) ?[place], [place], [place] as Education officer, museum  ? ?Medications: ?Outpatient Medications Prior to Visit   ?Medication Sig Note  ? alendronate (FOSAMAX) 70 MG tablet TAKE 1 TABLET EVERY WEEK  ON  AN  EMPTY  STOMACH WITH A FULL GLASS OF WATER   ? atorvastatin (LIPITOR) 10 MG tablet TAKE 1 TABLET EVERY DAY   ? calcium carbonate (OS-CAL - DOSED IN MG OF ELEMENTAL CALCIUM) 1250 (500 Ca) MG tablet Take 1 tablet by mouth once a week.   ? Vitamin D, Ergocalciferol, (DRISDOL) 1.25 MG (50000 UNIT) CAPS capsule Take 50,000 Units by mouth every 7 (seven) days. 05/12/2021: Patient reports that she only takes once a month  ? [DISCONTINUED] azithromycin (ZITHROMAX) 500 MG tablet Take 1 tablet (500 mg total) by mouth daily.   ? [DISCONTINUED] cetirizine (ZYRTEC) 10 MG tablet Take 1 tablet (10 mg total) by mouth daily.   ? [DISCONTINUED] conjugated estrogens (PREMARIN) vaginal cream Place 1 Applicatorful vaginally daily.   ? ?No facility-administered medications prior to visit.  ? ? ?Review of Systems  ?All other systems reviewed and are negative. ? ?Last CBC ?Lab Results  ?Component Value Date  ? WBC 7.9 11/19/2020  ? HGB 13.8 11/19/2020  ? HCT 41.1 11/19/2020  ? MCV 91 11/19/2020  ? MCH 30.4 11/19/2020  ? RDW 13.4 11/19/2020  ? PLT 207 11/19/2020  ? ?Last metabolic panel ?Lab Results  ?Component Value Date  ? GLUCOSE 79 11/19/2020  ? NA 140 11/19/2020  ? K 4.3 11/19/2020  ? CL 103 11/19/2020  ? CO2 22 11/19/2020  ? BUN 9 11/19/2020  ? CREATININE 0.86 11/19/2020  ? EGFR 74 11/19/2020  ? CALCIUM 9.6 11/19/2020  ? PHOS 2.3 (L) 07/18/2017  ? PROT 6.6 11/19/2020  ? ALBUMIN 4.7 11/19/2020  ? LABGLOB 1.9 11/19/2020  ? AGRATIO 2.5 (H) 11/19/2020  ? BILITOT 0.3 11/19/2020  ? ALKPHOS 68 11/19/2020  ? AST 23 11/19/2020  ? ALT 15 11/19/2020  ? ANIONGAP 12 08/24/2018  ? ?Last lipids ?Lab Results  ?Component Value Date  ? CHOL 194 11/19/2020  ? HDL 84 11/19/2020  ? [place]one 92 11/19/2020  ? TRIG 103 11/19/2020  ? CHOLHDL 2.3 11/19/2020  ? ?Last hemoglobin A1c ?Lab Results  ?Component Value Date  ? HGBA1C 5.0 07/13/2017  ? ?Last thyroid  functions ?Lab Results  ?Component Value Date  ? TSH 2.520 11/19/2020  ? ?Last vitamin D ?No results found for: 25OHVITD2, [place], VD25OH ?Last vitamin B12 and Folate ?No results found for: VITAMINB12, FOLATE ?  ? Objective  ? ?  ?BP 127/81   Pulse 68   Temp 98.4 ?F (36.9 ?C) (Oral)   Resp 16   Ht 5' 9.5" (1.765  m)   Wt 157 lb 9.6 oz (71.5 kg)   SpO2 97%   BMI 22.94 kg/m?  ?BP Readings from Last 3 Encounters:  ?05/12/21 127/81  ?11/19/20 114/72  ?09/09/20 137/77  ? ?Wt Readings from Last 3 Encounters:  ?05/12/21 157 lb 9.6 oz (71.5 kg)  ?04/18/21 160 lb (72.6 kg)  ?11/19/20 140 lb (63.5 kg)  ? ?SpO2 Readings from Last 3 Encounters:  ?05/12/21 97%  ?08/19/20 99%  ?06/04/20 100%  ? ?  ? ? ?Physical Exam ?Vitals and nursing note reviewed.  ?Constitutional:   ?   General: She is awake. She is not in acute distress. ?   Appearance: Normal appearance. She is well-developed, well-groomed and normal weight. She is not ill-appearing, toxic-appearing or diaphoretic.  ?HENT:  ?   Head: Normocephalic and atraumatic.  ?   Jaw: There is normal jaw occlusion. No trismus, tenderness, swelling or pain on movement.  ?   Right Ear: Hearing, tympanic membrane, ear canal and external ear normal. There is no impacted cerumen.  ?   Left Ear: Hearing, tympanic membrane, ear canal and external ear normal. There is no impacted cerumen.  ?   Ears:  ?   Comments: R ear tinnitus ?   Nose: Nose normal. No congestion or rhinorrhea.  ?   Right Turbinates: Not enlarged, swollen or pale.  ?   Left Turbinates: Not enlarged, swollen or pale.  ?   Right Sinus: No maxillary sinus tenderness or frontal sinus tenderness.  ?   Left Sinus: No maxillary sinus tenderness or frontal sinus tenderness.  ?   Mouth/Throat:  ?   Lips: Pink.  ?   Mouth: Mucous membranes are moist. No injury.  ?   Tongue: No lesions.  ?   Pharynx: Oropharynx is clear. Uvula midline. No pharyngeal swelling, oropharyngeal exudate, posterior oropharyngeal erythema or uvula  swelling.  ?   Tonsils: No tonsillar exudate or tonsillar abscesses.  ?Eyes:  ?   General: Lids are normal. Lids are everted, no foreign bodies appreciated. Vision grossly intact. Gaze aligned appropriately. No allergic shiner or visual

## 2021-05-12 NOTE — Assessment & Plan Note (Signed)
Chronic, stable ?Not currently on medication ?Mother passed d/t alzheimers/she was in hospice ?Pt denies SI or HI; contracted to safety ?Has previously been on SSRI and Trazodone to assist; neither was beneficial per pt report ?Does not wish to start new medication at this time ?

## 2021-05-12 NOTE — Assessment & Plan Note (Signed)
Due for screening for mammogram, denies breast concerns, provided with phone number to call and schedule appointment for mammogram. Encouraged to repeat breast cancer screening every 1-2 years.  

## 2021-05-12 NOTE — Assessment & Plan Note (Signed)
Wishes to schedule with derm office for TBSE and cancer screening ?

## 2021-05-12 NOTE — Assessment & Plan Note (Signed)
Chronic, seen on CT scan 2023 s/s tobacco risk ?Recommend use of daily inhaler to assist ?Recommend reduced intake of nicotine/tobacco to further prevent worsening condition ?

## 2021-05-12 NOTE — Assessment & Plan Note (Signed)
Chronic, variable ?Currently 3 weeks sober; congratulated  ?

## 2021-05-12 NOTE — Assessment & Plan Note (Signed)
Encourage annual eye visit ?Encouraged dental visit q 6 months ?Things to do to keep yourself healthy  ?- Exercise at least 30-45 minutes a day, 3-4 days a week.  ?- Eat a low-fat diet with lots of fruits and vegetables, up to 7-9 servings per day.  ?- Seatbelts can save your life. Wear them always.  ?- Smoke detectors on every level of your home, check batteries every year.  ?- Eye Doctor - have an eye exam every 1-2 years  ?- Safe sex - if you may be exposed to STDs, use a condom.  ?- Alcohol -  If you drink, do it moderately, less than 2 drinks per day.  ?- [place]. Choose someone to speak for you if you are not able.  ?- Depression is common in our stressful world.If you're feeling down or losing interest in things you normally enjoy, please come in for a visit.  ?- Violence - If anyone is threatening or hurting you, please call immediately. ? ? ?

## 2021-05-13 LAB — CBC WITH DIFFERENTIAL/PLATELET
Basophils Absolute: 0.1 10*3/uL (ref 0.0–0.2)
Basos: 2 %
EOS (ABSOLUTE): 0.2 10*3/uL (ref 0.0–0.4)
Eos: 3 %
Hematocrit: 40.1 % (ref 34.0–46.6)
Hemoglobin: 13.2 g/dL (ref 11.1–15.9)
Immature Grans (Abs): 0 10*3/uL (ref 0.0–0.1)
Immature Granulocytes: 0 %
Lymphocytes Absolute: 2.5 10*3/uL (ref 0.7–3.1)
Lymphs: 40 %
MCH: 28.3 pg (ref 26.6–33.0)
MCHC: 32.9 g/dL (ref 31.5–35.7)
MCV: 86 fL (ref 79–97)
Monocytes Absolute: 0.5 10*3/uL (ref 0.1–0.9)
Monocytes: 8 %
Neutrophils Absolute: 3 10*3/uL (ref 1.4–7.0)
Neutrophils: 47 %
Platelets: 183 10*3/uL (ref 150–450)
RBC: 4.67 x10E6/uL (ref 3.77–5.28)
RDW: 13.6 % (ref 11.7–15.4)
WBC: 6.3 10*3/uL (ref 3.4–10.8)

## 2021-05-13 LAB — LIPID PANEL
Chol/HDL Ratio: 2.5 ratio (ref 0.0–4.4)
Cholesterol, Total: 162 mg/dL (ref 100–199)
HDL: 66 mg/dL (ref >39)
LDL Chol Calc (NIH): 79 mg/dL (ref 0–99)
Triglycerides: 94 mg/dL (ref 0–149)
VLDL Cholesterol Cal: 17 mg/dL (ref 5–40)

## 2021-05-13 LAB — COMPREHENSIVE METABOLIC PANEL
ALT: 14 IU/L (ref 0–32)
AST: 20 IU/L (ref 0–40)
Albumin/Globulin Ratio: 2.2 (ref 1.2–2.2)
Albumin: 4.6 g/dL (ref 3.8–4.8)
Alkaline Phosphatase: 75 IU/L (ref 44–121)
BUN/Creatinine Ratio: 8 — ABNORMAL LOW (ref 12–28)
BUN: 7 mg/dL — ABNORMAL LOW (ref 8–27)
Bilirubin Total: 0.5 mg/dL (ref 0.0–1.2)
CO2: 22 mmol/L (ref 20–29)
Calcium: 9.5 mg/dL (ref 8.7–10.3)
Chloride: 108 mmol/L — ABNORMAL HIGH (ref 96–106)
Creatinine, Ser: 0.91 mg/dL (ref 0.57–1.00)
Globulin, Total: 2.1 g/dL (ref 1.5–4.5)
Glucose: 82 mg/dL (ref 70–99)
Potassium: 4.5 mmol/L (ref 3.5–5.2)
Sodium: 143 mmol/L (ref 134–144)
Total Protein: 6.7 g/dL (ref 6.0–8.5)
eGFR: 68 mL/min/{1.73_m2} (ref >59)

## 2021-05-15 ENCOUNTER — Other Ambulatory Visit: Payer: Self-pay | Admitting: *Deleted

## 2021-05-15 DIAGNOSIS — H9311 Tinnitus, right ear: Secondary | ICD-10-CM

## 2021-05-15 DIAGNOSIS — J432 Centrilobular emphysema: Secondary | ICD-10-CM

## 2021-05-16 ENCOUNTER — Other Ambulatory Visit: Payer: Self-pay

## 2021-05-16 DIAGNOSIS — J432 Centrilobular emphysema: Secondary | ICD-10-CM

## 2021-05-16 DIAGNOSIS — H9311 Tinnitus, right ear: Secondary | ICD-10-CM

## 2021-05-16 MED ORDER — TRELEGY ELLIPTA 100-62.5-25 MCG/ACT IN AEPB: 1.0000 | INHALATION_SPRAY | Freq: Every day | RESPIRATORY_TRACT | 11 refills | Status: DC

## 2021-05-16 MED ORDER — TRIAMTERENE-HCTZ 37.5-25 MG PO TABS: 1.0000 | ORAL_TABLET | Freq: Every day | ORAL | 1 refills | Status: DC

## 2021-05-16 NOTE — Telephone Encounter (Signed)
Called pt to verify that she wants her refills sent to [facility] instead of [facility]. Reordered medications to [facility]. ?

## 2021-05-16 NOTE — Telephone Encounter (Signed)
Medications were refilled 05/12/21 by PCP. Will refuse this request. ?

## 2021-05-19 ENCOUNTER — Encounter: Payer: Self-pay | Admitting: Dermatology

## 2021-05-19 ENCOUNTER — Ambulatory Visit (INDEPENDENT_AMBULATORY_CARE_PROVIDER_SITE_OTHER): Payer: Medicare HMO | Admitting: Dermatology

## 2021-05-19 DIAGNOSIS — L570 Actinic keratosis: Secondary | ICD-10-CM | POA: Diagnosis not present

## 2021-05-19 DIAGNOSIS — L821 Other seborrheic keratosis: Secondary | ICD-10-CM

## 2021-05-19 DIAGNOSIS — D229 Melanocytic nevi, unspecified: Secondary | ICD-10-CM | POA: Diagnosis not present

## 2021-05-19 DIAGNOSIS — L820 Inflamed seborrheic keratosis: Secondary | ICD-10-CM | POA: Diagnosis not present

## 2021-05-19 DIAGNOSIS — L578 Other skin changes due to chronic exposure to nonionizing radiation: Secondary | ICD-10-CM | POA: Diagnosis not present

## 2021-05-19 DIAGNOSIS — I8393 Asymptomatic varicose veins of bilateral lower extremities: Secondary | ICD-10-CM | POA: Diagnosis not present

## 2021-05-19 DIAGNOSIS — L814 Other melanin hyperpigmentation: Secondary | ICD-10-CM

## 2021-05-19 NOTE — Patient Instructions (Signed)

## 2021-05-19 NOTE — Progress Notes (Signed)
? ?New Patient Visit ? ?Subjective  ?Jennifer [name] is a 71 y.o. female who presents for the following: Irregular skin lesions (On the face, neck, and lower leg - patient is concerned and would like them checked today.). The patient has spots, moles and lesions to be evaluated, some may be new or changing. ? ?The following portions of the chart were reviewed this encounter and updated as appropriate:  ? Tobacco  Allergies  Meds  Problems  Med Hx  Surg Hx  Fam Hx   ?  ?Review of Systems:  No other skin or systemic complaints except as noted in HPI or Assessment and Plan. ? ?Objective  ?Well appearing patient in no apparent distress; mood and affect are within normal limits. ? ?A focused examination was performed including the face, neck, trunk, extremities. Relevant physical exam findings are noted in the Assessment and Plan. ? ?L neck x 1, face x 4, L forearm x 1, L leg x 1 (7) ?Erythematous stuck-on, waxy papule or plaque ? ?Face x 5 (5) ?Erythematous thin papules/macules with gritty scale.  ? ? ?Assessment & Plan  ?Inflamed seborrheic keratosis (7) ?L neck x 1, face x 4, L forearm x 1, L leg x 1 ? ?Destruction of lesion - L neck x 1, face x 4, L forearm x 1, L leg x 1 ?Complexity: simple   ?Destruction method: cryotherapy   ?Informed consent: discussed and consent obtained   ?Timeout:  patient name, date of birth, surgical site, and procedure verified ?Lesion destroyed using liquid nitrogen: Yes   ?Region frozen until ice ball extended beyond lesion: Yes   ?Outcome: patient tolerated procedure well with no complications   ?Post-procedure details: wound care instructions given   ? ?AK (actinic keratosis) (5) ?Face x 5 ? ?Destruction of lesion - Face x 5 ?Complexity: simple   ?Destruction method: cryotherapy   ?Informed consent: discussed and consent obtained   ?Timeout:  patient name, date of birth, surgical site, and procedure verified ?Lesion destroyed using liquid nitrogen: Yes   ?Region frozen until ice  ball extended beyond lesion: Yes   ?Outcome: patient tolerated procedure well with no complications   ?Post-procedure details: wound care instructions given   ? ?Actinic Damage ?- chronic, secondary to cumulative UV radiation exposure/sun exposure over time ?- diffuse scaly erythematous macules with underlying dyspigmentation ?- Recommend daily broad spectrum sunscreen SPF 30+ to sun-exposed areas, reapply every 2 hours as needed.  ?- Recommend staying in the shade or wearing long sleeves, sun glasses (UVA+UVB protection) and wide brim hats (4-inch brim around the entire circumference of the hat). ?- Call for new or changing lesions. ? ?Lentigines ?- Scattered tan macules ?- Due to sun exposure ?- Benign-appering, observe ?- Recommend daily broad spectrum sunscreen SPF 30+ to sun-exposed areas, reapply every 2 hours as needed. ?- Call for any changes ? ?Seborrheic Keratoses ?- Stuck-on, waxy, tan-brown papules and/or plaques  ?- Benign-appearing ?- Discussed benign etiology and prognosis. ?- Observe ?- Call for any changes ? ?Melanocytic Nevi ?- Tan-brown and/or pink-flesh-colored symmetric macules and papules ?- Benign appearing on exam today ?- Observation ?- Call clinic for new or changing moles ?- Recommend daily use of broad spectrum spf 30+ sunscreen to sun-exposed areas.  ? ?Varicose Veins/Spider Veins ?- Dilated blue, purple or red veins at the lower extremities ?- Reassured ?- Smaller vessels can be treated by sclerotherapy (a procedure to inject a medicine into the veins to make them disappear) if desired, but the  treatment is not covered by insurance. Larger vessels may be covered if symptomatic and we would refer to vascular surgeon if treatment desired. ? ?Return for AK and ISK follow up in 4-6 mths. ? ?I, Rudell Cobb, CMA, am acting as scribe for Sarina Ser, MD . ?Documentation: I have reviewed the above documentation for accuracy and completeness, and I agree with the above. ? ?Sarina Ser,  MD ? ? ?

## 2021-05-21 DIAGNOSIS — H2513 Age-related nuclear cataract, bilateral: Secondary | ICD-10-CM | POA: Diagnosis not present

## 2021-05-27 ENCOUNTER — Encounter: Payer: Self-pay | Admitting: Dermatology

## 2021-07-08 ENCOUNTER — Ambulatory Visit (INDEPENDENT_AMBULATORY_CARE_PROVIDER_SITE_OTHER): Payer: Medicare HMO

## 2021-07-08 VITALS — Wt 157.0 lb

## 2021-07-08 DIAGNOSIS — Z0000 Encounter for general adult medical examination without abnormal findings: Secondary | ICD-10-CM

## 2021-07-08 NOTE — Patient Instructions (Addendum)
[name] , Thank you for taking time to come for your Medicare Wellness Visit. I appreciate your ongoing commitment to your health goals. Please review the following plan we discussed and let me know if I can assist you in the future.   Screening recommendations/referrals: Colonoscopy: 03/04/20 Mammogram: 12/04/20 Bone Density: 03/11/20 Recommended yearly ophthalmology/optometry visit for glaucoma screening and checkup Recommended yearly dental visit for hygiene and checkup  Vaccinations: Influenza vaccine: 11/19/20 Pneumococcal vaccine: 01/25/19 Tdap vaccine: n/d Shingles vaccine: n/d   Covid-19:02/14/19, 03/14/19, 12/04/19  Advanced directives: no  Conditions/risks identified: none  Next appointment: Follow up in one year for your annual wellness visit - 07/13/22 @ 10:15am by phone   Preventive Care 7 Years and Older, Female Preventive care refers to lifestyle choices and visits with your health care provider that can promote health and wellness. What does preventive care include? A yearly physical exam. This is also called an annual well check. Dental exams once or twice a year. Routine eye exams. Ask your health care provider how often you should have your eyes checked. Personal lifestyle choices, including: Daily care of your teeth and gums. Regular physical activity. Eating a healthy diet. Avoiding tobacco and drug use. Limiting alcohol use. Practicing safe sex. Taking low-dose aspirin every day. Taking vitamin and mineral supplements as recommended by your health care provider. What happens during an annual well check? The services and screenings done by your health care provider during your annual well check will depend on your age, overall health, lifestyle risk factors, and family history of disease. Counseling  Your health care provider may ask you questions about your: Alcohol use. Tobacco use. Drug use. Emotional well-being. Home and relationship  well-being. Sexual activity. Eating habits. History of falls. Memory and ability to understand (cognition). Work and work Statistician. Reproductive health. Screening  You may have the following tests or measurements: Height, weight, and BMI. Blood pressure. Lipid and cholesterol levels. These may be checked every 5 years, or more frequently if you are over 75 years old. Skin check. Lung cancer screening. You may have this screening every year starting at age 67 if you have a 30-pack-year history of smoking and currently smoke or have quit within the past 15 years. Fecal occult blood test (FOBT) of the stool. You may have this test every year starting at age 65. Flexible sigmoidoscopy or colonoscopy. You may have a sigmoidoscopy every 5 years or a colonoscopy every 10 years starting at age 34. Hepatitis C blood test. Hepatitis B blood test. Sexually transmitted disease (STD) testing. Diabetes screening. This is done by checking your blood sugar (glucose) after you have not eaten for a while (fasting). You may have this done every 1-3 years. Bone density scan. This is done to screen for osteoporosis. You may have this done starting at age 72. Mammogram. This may be done every 1-2 years. Talk to your health care provider about how often you should have regular mammograms. Talk with your health care provider about your test results, treatment options, and if necessary, the need for more tests. Vaccines  Your health care provider may recommend certain vaccines, such as: Influenza vaccine. This is recommended every year. Tetanus, diphtheria, and acellular pertussis (Tdap, Td) vaccine. You may need a Td booster every 10 years. Zoster vaccine. You may need this after age 47. Pneumococcal 13-valent conjugate (PCV13) vaccine. One dose is recommended after age 38. Pneumococcal polysaccharide (PPSV23) vaccine. One dose is recommended after age 29. Talk to your health care  provider about which  screenings and vaccines you need and how often you need them. This information is not intended to replace advice given to you by your health care provider. Make sure you discuss any questions you have with your health care provider. Document Released: 02/08/2015 Document Revised: 10/02/2015 Document Reviewed: 11/13/2014 Elsevier Interactive Patient Education  2017 [place] Prevention in the Home Falls can cause injuries. They can happen to people of all ages. There are many things you can do to make your home safe and to help prevent falls. What can I do on the outside of my home? Regularly fix the edges of walkways and driveways and fix any cracks. Remove anything that might make you trip as you walk through a door, such as a raised step or threshold. Trim any bushes or trees on the path to your home. Use bright outdoor lighting. Clear any walking paths of anything that might make someone trip, such as rocks or tools. Regularly check to see if handrails are loose or broken. Make sure that both sides of any steps have handrails. Any raised decks and porches should have guardrails on the edges. Have any leaves, snow, or ice cleared regularly. Use sand or salt on walking paths during winter. Clean up any spills in your garage right away. This includes oil or grease spills. What can I do in the bathroom? Use night lights. Install grab bars by the toilet and in the tub and shower. Do not use towel bars as grab bars. Use non-skid mats or decals in the tub or shower. If you need to sit down in the shower, use a plastic, non-slip stool. Keep the floor dry. Clean up any water that spills on the floor as soon as it happens. Remove soap buildup in the tub or shower regularly. Attach bath mats securely with double-sided non-slip rug tape. Do not have throw rugs and other things on the floor that can make you trip. What can I do in the bedroom? Use night lights. Make sure that you have a  light by your bed that is easy to reach. Do not use any sheets or blankets that are too big for your bed. They should not hang down onto the floor. Have a firm chair that has side arms. You can use this for support while you get dressed. Do not have throw rugs and other things on the floor that can make you trip. What can I do in the kitchen? Clean up any spills right away. Avoid walking on wet floors. Keep items that you use a lot in easy-to-reach places. If you need to reach something above you, use a strong step stool that has a grab bar. Keep electrical cords out of the way. Do not use floor polish or wax that makes floors slippery. If you must use wax, use non-skid floor wax. Do not have throw rugs and other things on the floor that can make you trip. What can I do with my stairs? Do not leave any items on the stairs. Make sure that there are handrails on both sides of the stairs and use them. Fix handrails that are broken or loose. Make sure that handrails are as long as the stairways. Check any carpeting to make sure that it is firmly attached to the stairs. Fix any carpet that is loose or worn. Avoid having throw rugs at the top or bottom of the stairs. If you do have throw rugs, attach them to the  floor with carpet tape. Make sure that you have a light switch at the top of the stairs and the bottom of the stairs. If you do not have them, ask someone to add them for you. What else can I do to help prevent falls? Wear shoes that: Do not have high heels. Have rubber bottoms. Are comfortable and fit you well. Are closed at the toe. Do not wear sandals. If you use a stepladder: Make sure that it is fully opened. Do not climb a closed stepladder. Make sure that both sides of the stepladder are locked into place. Ask someone to hold it for you, if possible. Clearly mark and make sure that you can see: Any grab bars or handrails. First and last steps. Where the edge of each step  is. Use tools that help you move around (mobility aids) if they are needed. These include: Canes. Walkers. Scooters. Crutches. Turn on the lights when you go into a dark area. Replace any light bulbs as soon as they burn out. Set up your furniture so you have a clear path. Avoid moving your furniture around. If any of your floors are uneven, fix them. If there are any pets around you, be aware of where they are. Review your medicines with your doctor. Some medicines can make you feel dizzy. This can increase your chance of falling. Ask your doctor what other things that you can do to help prevent falls. This information is not intended to replace advice given to you by your health care provider. Make sure you discuss any questions you have with your health care provider. Document Released: 11/08/2008 Document Revised: 06/20/2015 Document Reviewed: 02/16/2014 Elsevier Interactive Patient Education  2017 [employer].

## 2021-07-08 NOTE — Progress Notes (Signed)
Virtual Visit via Telephone Note  I connected with  Jennifer [name] on 07/08/21 at 11:45 AM EDT by telephone and verified that I am speaking with the correct person using two identifiers.  Location: Patient: home Provider: [facility] Persons participating in the virtual visit: [place]   I discussed the limitations, risks, security and privacy concerns of performing an evaluation and management service by telephone and the availability of in person appointments. The patient expressed understanding and agreed to proceed.  Interactive audio and video telecommunications were attempted between this nurse and patient, however failed, due to patient having technical difficulties OR patient did not have access to video capability.  We continued and completed visit with audio only.  Some vital signs may be absent or patient reported.   Jennifer David, LPN  Subjective:   Jennifer [name] is a 71 y.o. female who presents for Medicare Annual (Subsequent) preventive examination.  Review of Systems     Cardiac Risk Factors include: advanced age (>59mn, >>65women);dyslipidemia;hypertension     Objective:    There were no vitals filed for this visit. There is no height or weight on file to calculate BMI.     07/08/2021   11:55 AM 03/04/2020    9:58 AM 09/14/2019    3:54 PM 08/24/2018    8:32 PM 07/18/2018   10:40 PM 07/01/2018    5:26 PM 06/28/2018    6:51 PM  Advanced Directives  Does Patient Have a Medical Advance Directive? No No No No No No No  Would patient like information on creating a medical advance directive? No - Patient declined  No - Patient declined No - Patient declined   No - Patient declined    Current Medications (verified) Outpatient Encounter Medications as of 07/08/2021  Medication Sig   alendronate (FOSAMAX) 70 MG tablet TAKE 1 TABLET EVERY WEEK  ON  AN  EMPTY  STOMACH WITH A FULL GLASS OF WATER   atorvastatin (LIPITOR) 10 MG tablet TAKE 1 TABLET EVERY DAY   b  complex vitamins capsule Take 1 capsule by mouth daily.   calcium carbonate (OS-CAL - DOSED IN MG OF ELEMENTAL CALCIUM) 1250 (500 Ca) MG tablet Take 1 tablet by mouth once a week.   OVER THE COUNTER MEDICATION RAE - vegan collagen boost BID   Vitamin D, Ergocalciferol, (DRISDOL) 1.25 MG (50000 UNIT) CAPS capsule Take 50,000 Units by mouth every 7 (seven) days.   Fluticasone-Umeclidin-Vilant (TRELEGY ELLIPTA) 100-62.5-25 MCG/ACT AEPB Inhale 1 puff into the lungs daily. (Patient not taking: Reported on 07/08/2021)   triamterene-hydrochlorothiazide (MAXZIDE-25) 37.5-25 MG tablet Take 1 tablet by mouth daily. (Patient not taking: Reported on 07/08/2021)   No facility-administered encounter medications on file as of 07/08/2021.    Allergies (verified) Patient has no known allergies.   History: Past Medical History:  Diagnosis Date   Arthritis    mild   [place] [facility] [place] [facility]   Dysrhythmia    palpitation   Family history of colon cancer    Headache    as  a child    Heart murmur    while pregnant   Hyperlipidemia 07/17/2017   Tobacco abuse    Past Surgical History:  Procedure Laterality Date   ABDOMINAL HYSTERECTOMY     1996   COLONOSCOPY WITH PROPOFOL N/A 03/29/2017   Procedure: COLONOSCOPY WITH PROPOFOL;  Surgeon: VLin Landsman MD;  [facility]  Service: Gastroenterology;  Laterality: N/A;   COLONOSCOPY WITH PROPOFOL N/A 03/04/2020   Procedure: COLONOSCOPY WITH PROPOFOL;  Surgeon: Lin Landsman, MD;  [facility] END[facility];  Service: Gastroenterology;  Laterality: N/A;   HEMICOLECTOMY     righ  [id number]   LAPAROSCOPIC RIGHT HEMI COLECTOMY Right 07/16/2017   Procedure: LAPAROSCOPIC RIGHT HEMI COLECTOMY;  Surgeon: Ileana Roup, MD;  Location: [facility];  Service: General;  Laterality: Right;   TUBAL LIGATION     Family History  Problem Relation Age of Onset   Diabetes Mother 58       12/2020    Kidney disease Mother    Heart attack Father    Colon cancer Paternal Aunt 61   Colon cancer Paternal Uncle        dx >50, colon completely removed   Diabetes Maternal Grandmother    Colon cancer Maternal Grandfather    Heart disease Paternal Grandmother    Breast cancer Neg Hx    Social History   Socioeconomic History   Marital status: Divorced    Spouse name: Not on file   Number of children: 1   Years of education: Not on file   Highest education level: 10th grade  Occupational History   Occupation: retired  Tobacco Use   Smoking status: Every Day    Packs/day: 1.00    Years: 46.35    Total pack years: 46.35    Types: Cigarettes   Smokeless tobacco: Never  Vaping Use   Vaping Use: Never used  Substance and Sexual Activity   Alcohol use: Not Currently   Drug use: No   Sexual activity: Not Currently  Other Topics Concern   Not on file  Social History Narrative   Not on file   Social Determinants of Health   Financial Resource Strain: Low Risk  (07/08/2021)   Overall Financial Resource Strain (CARDIA)    Difficulty of Paying Living Expenses: Not hard at all  Food Insecurity: No Food Insecurity (07/08/2021)   Hunger Vital Sign    Worried About Running Out of Food in the Last Year: Never true    Ran Out of Food in the Last Year: Never true  Transportation Needs: No Transportation Needs (07/08/2021)   PRAPARE - Hydrologist (Medical): No    Lack of Transportation (Non-Medical): No  Physical Activity: Sufficiently Active (07/08/2021)   Exercise Vital Sign    Days of Exercise per Week: 5 days    Minutes of Exercise per Session: 30 min  Stress: No Stress Concern Present (07/08/2021)   [place]    Feeling of Stress : Not at all  Social Connections: Socially Isolated (07/08/2021)   Social Connection and Isolation Panel [NHANES]    Frequency of Communication with Friends and  Family: More than three times a week    Frequency of Social Gatherings with Friends and Family: More than three times a week    Attends Religious Services: Never    Marine scientist or Organizations: No    Attends Music therapist: Never    Marital Status: Divorced    Tobacco Counseling Ready to quit: Not Answered Counseling given: Not Answered   Clinical Intake:  Pre-visit preparation completed: Yes  Pain : No/denies pain     Nutritional Risks: None Diabetes: No  How often do you need to have someone help you when you read instructions, pamphlets, or other written materials  from your doctor or pharmacy?: 1 - Never  Diabetic?no  Interpreter Needed?: No  Information entered by :: Kirke Shaggy, LPN   Activities of Daily Living    07/08/2021   11:57 AM 07/08/2021   11:56 AM  In your present state of health, do you have any difficulty performing the following activities:  Hearing? 0 0  Vision?  0  Difficulty concentrating or making decisions?  0  Walking or climbing stairs?  0  Dressing or bathing?  0  Doing errands, shopping?  0  Preparing Food and eating ?  N  Using the Toilet?  N  In the past six months, have you accidently leaked urine?  N  Do you have problems with loss of bowel control?  N  Managing your Medications?  N  Managing your Finances?  N  Housekeeping or managing your Housekeeping?  N    Patient Care Team: Gwyneth Sprout, FNP as PCP - General (Family Medicine) Sheets, Vonna Kotyk, [place] as Counselor (Licensed Clinical Social Worker) Dingeldein, Remo Lipps, MD (Ophthalmology) Jenita Seashore, [place] M,  as Social Worker  Indicate any recent [place] you may have received from other than [facility] providers in the past year (date may be approximate).     Assessment:   This is a routine wellness examination for Jennifer [name].  Hearing/Vision screen Hearing Screening - Comments:: No aids  Vision Screening - Comments:: Wears glasses- [facility]  Dietary issues and exercise activities discussed: Current Exercise Habits: Home exercise routine, Type of exercise: walking, Time (Minutes): 30, Frequency (Times/Week): 5, Weekly Exercise (Minutes/Week): 150, Intensity: Mild   Goals Addressed             This Visit's Progress    DIET - EAT MORE FRUITS AND VEGETABLES         Depression Screen    07/08/2021   11:56 AM 05/12/2021    1:14 PM 08/19/2020    1:20 PM 01/31/2020    2:00 PM 09/14/2019    3:51 PM 05/15/2019    2:47 PM 01/25/2019    1:58 PM  PHQ 2/9 Scores  PHQ - 2 Score 0 0 0 4 0 1 2  PHQ- 9 Score 0 0 '6 13  4 7    '$ Fall Risk    07/08/2021   11:55 AM 07/04/2021    8:45 AM 06/29/2021    6:53 PM 05/12/2021    1:14 PM 01/31/2020    2:00 PM  [place] in the past year? 0 0 0   0 0 1  Number falls in past yr: 0   0 0  Injury with Fall? 0 0 0   0 1 1  Risk for fall due to : No Fall Risks    History of fall(s)  Follow up Falls evaluation completed    Falls evaluation completed;Education provided;Falls prevention discussed    FALL RISK PREVENTION PERTAINING TO THE HOME:  Any stairs in or around the home? Yes  If so, are there any without handrails? No  Home free of loose throw rugs in walkways, pet beds, electrical cords, etc? Yes  Adequate lighting in your home to reduce risk of falls? Yes   ASSISTIVE DEVICES UTILIZED TO PREVENT FALLS:  Life alert? No  Use of a cane, walker or w/c? No  Grab bars in the bathroom? Yes  Shower chair or bench in shower? No  Elevated toilet seat or a handicapped toilet? No   Cognitive Function:  07/08/2021   11:59 AM  6CIT Screen  What Year? 0 points  What month? 0 points  What time? 0 points  Count back from 20 0 points  Months in reverse 0 points  Repeat phrase 0 points  Total Score 0 points    Immunizations Immunization History  Administered Date(s) Administered   Fluad Quad(high Dose 65+) 01/25/2019, 01/31/2020, 11/19/2020   Influenza, High Dose  Seasonal PF 11/02/2017   Moderna Sars-Covid-2 Vaccination 02/14/2019, 03/14/2019, 12/04/2019   Pneumococcal Conjugate-13 03/25/2017   Pneumococcal Polysaccharide-23 01/25/2019    TDAP status: Due, Education has been provided regarding the importance of this vaccine. Advised may receive this vaccine at local pharmacy or Health Dept. Aware to provide a copy of the vaccination record if obtained from local pharmacy or Health Dept. Verbalized acceptance and understanding.  Flu Vaccine status: Up to date  Pneumococcal vaccine status: Up to date  Covid-19 vaccine status: Completed vaccines  Qualifies for Shingles Vaccine? Yes   Zostavax completed No   Shingrix Completed?: No.    Education has been provided regarding the importance of this vaccine. Patient has been advised to call insurance company to determine out of pocket expense if they have not yet received this vaccine. Advised may also receive vaccine at local pharmacy or Health Dept. Verbalized acceptance and understanding.  Screening Tests Health Maintenance  Topic Date Due   TETANUS/TDAP  Never done   Zoster Vaccines- Shingrix (1 of 2) Never done   COVID-19 Vaccine (4 - Booster for Moderna series) 01/29/2020   INFLUENZA VACCINE  08/26/2021   DEXA SCAN  03/11/2022   MAMMOGRAM  12/05/2022   COLONOSCOPY (Pts 45-12yr Insurance coverage will need to be confirmed)  03/04/2030   Pneumonia Vaccine 71 Years old  Completed   Hepatitis C Screening  Completed   HPV VACCINES  Aged Out    Health Maintenance  Health Maintenance Due  Topic Date Due   TETANUS/TDAP  Never done   Zoster Vaccines- Shingrix (1 of 2) Never done   COVID-19 Vaccine (4 - Booster for Moderna series) 01/29/2020    Colorectal cancer screening: Type of screening: Colonoscopy. Completed 03/04/20. Repeat every 5 years  Mammogram status: Completed 12/04/20. Repeat every year  Bone Density status: Completed 03/11/20. Results reflect: Bone density results: NORMAL.  Repeat every 5 years.  Lung Cancer Screening: (Low Dose CT Chest recommended if Age 69-80years, 30 pack-year currently smoking OR have quit w/in 15years.) does qualify.   Lung Cancer Screening Referral: has already had for this year  Additional Screening:  Hepatitis C Screening: does qualify; Completed 11/02/17  Vision Screening: Recommended annual ophthalmology exams for early detection of glaucoma and other disorders of the eye. Is the patient up to date with their annual eye exam?  Yes  Who is the provider or what is the name of the office in which the patient attends annual [facility] exams[facility] provider, would they like to be referred to a provider to establish care? No .   Dental Screening: Recommended annual dental exams for proper oral hygiene  Community Resource Referral / Chronic Care Management: CRR required this visit?  No   CCM required this visit?  No      Plan:     I have personally reviewed and noted the following in the patient's chart:   Medical and social history Use of alcohol, tobacco or illicit drugs  Current medications and supplements including opioid prescriptions.  Functional  ability and status Nutritional status Physical activity Advanced directives List of other physicians Hospitalizations, surgeries, and ER visits in previous 12 months Vitals Screenings to include cognitive, depression, and falls Referrals and appointments  In addition, I have reviewed and discussed with patient certain preventive protocols, quality metrics, and best practice recommendations. A written personalized care plan for preventive services as well as general preventive health recommendations were provided to patient.     Jennifer David, LPN   4/31/4276   Nurse Notes: none

## 2021-11-11 ENCOUNTER — Ambulatory Visit: Payer: Medicare HMO | Admitting: Family Medicine

## 2021-11-12 ENCOUNTER — Ambulatory Visit: Payer: Medicare HMO | Admitting: Dermatology

## 2021-11-12 ENCOUNTER — Encounter: Payer: Self-pay | Admitting: Family Medicine

## 2021-11-12 ENCOUNTER — Ambulatory Visit (INDEPENDENT_AMBULATORY_CARE_PROVIDER_SITE_OTHER): Payer: Medicare Other | Admitting: Family Medicine

## 2021-11-12 VITALS — BP 128/69 | HR 69 | Resp 16 | Ht 69.0 in | Wt 149.0 lb

## 2021-11-12 DIAGNOSIS — F339 Major depressive disorder, recurrent, unspecified: Secondary | ICD-10-CM

## 2021-11-12 DIAGNOSIS — Z720 Tobacco use: Secondary | ICD-10-CM | POA: Diagnosis not present

## 2021-11-12 DIAGNOSIS — H9311 Tinnitus, right ear: Secondary | ICD-10-CM | POA: Diagnosis not present

## 2021-11-12 DIAGNOSIS — J432 Centrilobular emphysema: Secondary | ICD-10-CM

## 2021-11-12 DIAGNOSIS — Z23 Encounter for immunization: Secondary | ICD-10-CM | POA: Diagnosis not present

## 2021-11-12 DIAGNOSIS — I700 Atherosclerosis of aorta: Secondary | ICD-10-CM | POA: Diagnosis not present

## 2021-11-12 DIAGNOSIS — F1011 Alcohol abuse, in remission: Secondary | ICD-10-CM | POA: Insufficient documentation

## 2021-11-12 MED ORDER — TRIAMTERENE-HCTZ 37.5-25 MG PO TABS: 1.0000 | ORAL_TABLET | Freq: Every day | ORAL | 1 refills | Status: DC

## 2021-11-12 NOTE — Progress Notes (Signed)
Established patient visit   Patient: Jennifer [name]   DOB: Jul 20, 1950   71 y.o. Female  MRN: [id number] Visit Date: 11/12/2021  Today's healthcare provider: Gwyneth Sprout, FNP   I,Tiffany J Bragg,acting as a scribe for Gwyneth Sprout, FNP.,have documented all relevant documentation on the behalf of Gwyneth Sprout, FNP,as directed by  Gwyneth Sprout, FNP while in the presence of Gwyneth Sprout, FNP. Lipid/Cholesterol, Follow-up  Last lipid panel Other pertinent labs  Lab Results  Component Value Date   CHOL 162 05/12/2021   HDL 66 05/12/2021   LDLCALC 79 05/12/2021   TRIG 94 05/12/2021   CHOLHDL 2.5 05/12/2021   Lab Results  Component Value Date   ALT 14 05/12/2021   AST 20 05/12/2021   PLT 183 05/12/2021   TSH 2.520 11/19/2020     She was last seen for this 6 months ago.  Management since that visit includes continue medication.  She reports excellent compliance with treatment. She is not having side effects.   Symptoms: No chest pain No chest pressure/discomfort  No dyspnea No lower extremity edema  No numbness or tingling of extremity No orthopnea  Yes palpitations No paroxysmal nocturnal dyspnea  No speech difficulty No syncope   Current diet: well balanced Current exercise: strength training  The 10-year ASCVD risk score (Arnett DK, et al., 2019) is: 16.5%  ---------------------------------------------------------------------------------------------------   GAD-7 Results     No data to display          PHQ-9 Scores    11/12/2021    1:11 PM 07/08/2021   11:56 AM 05/12/2021    1:14 PM  PHQ9 SCORE ONLY  PHQ-9 Total Score 6 0 0    ---------------------------------------------------------------------------------------------------   Chief Complaint  Patient presents with   Hyperlipidemia   Subjective    HPI   Medications: Outpatient Medications Prior to Visit  Medication Sig   alendronate (FOSAMAX) 70 MG tablet TAKE 1 TABLET EVERY WEEK  ON   AN  EMPTY  STOMACH WITH A FULL GLASS OF WATER   atorvastatin (LIPITOR) 10 MG tablet TAKE 1 TABLET EVERY DAY   b complex vitamins capsule Take 1 capsule by mouth daily.   Calcium Citrate-Vitamin D 315-5 MG-MCG TABS    Fluticasone-Umeclidin-Vilant (TRELEGY ELLIPTA) 100-62.5-25 MCG/ACT AEPB Inhale 1 puff into the lungs daily.   Vitamin D, Ergocalciferol, (DRISDOL) 1.25 MG (50000 UNIT) CAPS capsule Take 50,000 Units by mouth every 7 (seven) days.   [DISCONTINUED] triamterene-hydrochlorothiazide (MAXZIDE-25) 37.5-25 MG tablet Take 1 tablet by mouth daily.   [DISCONTINUED] calcium carbonate (OS-CAL - DOSED IN MG OF ELEMENTAL CALCIUM) 1250 (500 Ca) MG tablet Take 1 tablet by mouth once a week.   [DISCONTINUED] OVER THE COUNTER MEDICATION RAE - vegan collagen boost BID   No facility-administered medications prior to visit.    Review of Systems   Objective    BP 128/69 (BP Location: Right Arm, Patient Position: Sitting, Cuff Size: Normal)   Pulse 69   Resp 16   Ht '5\' 9"'$  (1.753 m)   Wt 149 lb (67.6 kg)   SpO2 100%   BMI 22.00 kg/m   Physical Exam Vitals and nursing note reviewed.  Constitutional:      General: She is not in acute distress.    Appearance: Normal appearance. She is normal weight. She is not ill-appearing, toxic-appearing or diaphoretic.  HENT:     Head: Normocephalic and atraumatic.  Cardiovascular:     Rate and  Rhythm: Normal rate and regular rhythm.     Pulses: Normal pulses.     Heart sounds: Normal heart sounds. No murmur heard.    No friction rub. No gallop.  Pulmonary:     Effort: Pulmonary effort is normal. No respiratory distress.     Breath sounds: Normal breath sounds. No stridor. No wheezing, rhonchi or rales.  Chest:     Chest wall: No tenderness.  Musculoskeletal:        General: No swelling, tenderness, deformity or signs of injury. Normal range of motion.     Right lower leg: No edema.     Left lower leg: No edema.  Skin:    General: Skin is warm and  dry.     Capillary Refill: Capillary refill takes less than 2 seconds.     Coloration: Skin is not jaundiced or pale.     Findings: No bruising, erythema, lesion or rash.  Neurological:     General: No focal deficit present.     Mental Status: She is alert and oriented to person, place, and time. Mental status is at baseline.     Cranial Nerves: No cranial nerve deficit.     Sensory: No sensory deficit.     Motor: No weakness.     Coordination: Coordination normal.  Psychiatric:        Mood and Affect: Mood normal.        Behavior: Behavior normal.        Thought Content: Thought content normal.        Judgment: Judgment normal.     No results found for any visits on 11/12/21.  Assessment & Plan     Problem List Items Addressed This Visit       Cardiovascular and Mediastinum   Aortic atherosclerosis (HCC)    Chronic, noted on CT of lungs Continue to use statin to assist- lipitor 10 mg Continue to recommend further smoking cessation Labs previously stable at CPE Repeat in 6 months       Relevant Medications   triamterene-hydrochlorothiazide (MAXZIDE-25) 37.5-25 MG tablet     Respiratory   Centrilobular emphysema (HCC) - Primary    Chronic, stable No rhonchi/wheezes/got flu vaccine today Continue trelegy daily to assist         Other   Alcohol abuse, in remission    Congratulated on efforts       Depression, recurrent (HCC)    Chronic, worsening per PHQ; however, pt denies any complaints or medication needs at this time Has been doing well overall; has a b/f and has quit drinking       Need for influenza vaccination    Consented; VIS made available; no immediate side effects following administration; plan to repeat annually        Relevant Orders   Flu Vaccine QUAD High Dose(Fluad) (Completed)   Tinnitus of right ear    Chronic, stable Continue maxzide 25 to assist       Relevant Medications   triamterene-hydrochlorothiazide (MAXZIDE-25) 37.5-25 MG  tablet   Tobacco abuse    Chronic, maintains around 1 ppd due to "boredom" wishes to quit but hasn't been able to yet Refused additional smoking cessation education at this time        Return in about 6 months (around 05/14/2022) for annual examination.      Vonna Kotyk, FNP, have reviewed all documentation for this visit. The documentation on 11/12/21 for the exam, diagnosis, procedures, and orders are all  accurate and complete.    Gwyneth Sprout, [place] [phone] (phone) [phone] (fax)  [place]

## 2021-11-12 NOTE — Assessment & Plan Note (Signed)
Consented; VIS made available; no immediate side effects following administration; plan to repeat annually   

## 2021-11-12 NOTE — Assessment & Plan Note (Signed)
Congratulated on efforts

## 2021-11-12 NOTE — Assessment & Plan Note (Signed)
Chronic, noted on CT of lungs Continue to use statin to assist- lipitor 10 mg Continue to recommend further smoking cessation Labs previously stable at CPE Repeat in 6 months

## 2021-11-12 NOTE — Assessment & Plan Note (Signed)
Chronic, maintains around 1 ppd due to "boredom" wishes to quit but hasn't been able to yet Refused additional smoking cessation education at this time

## 2021-11-12 NOTE — Assessment & Plan Note (Signed)
Chronic, worsening per PHQ; however, pt denies any complaints or medication needs at this time Has been doing well overall; has a b/f and has quit drinking

## 2021-11-12 NOTE — Assessment & Plan Note (Signed)
Chronic, stable Continue maxzide 25 to assist

## 2021-11-12 NOTE — Assessment & Plan Note (Signed)
Chronic, stable No rhonchi/wheezes/got flu vaccine today Continue trelegy daily to assist

## 2021-11-17 ENCOUNTER — Ambulatory Visit
Admission: RE | Admit: 2021-11-17 | Discharge: 2021-11-17 | Disposition: A | Discharge status: Home / Self Care | Payer: Medicare Other | Source: Ambulatory Visit | Attending: Family Medicine | Admitting: Family Medicine

## 2021-11-17 DIAGNOSIS — Z1231 Encounter for screening mammogram for malignant neoplasm of breast: Secondary | ICD-10-CM | POA: Diagnosis not present

## 2021-11-19 NOTE — Progress Notes (Signed)
Hi Berdene  Normal mammogram; repeat in 1 year.  Please let us know if you have any questions.  Thank you,  Bayler Nehring, FNP 

## 2021-12-02 DIAGNOSIS — S60212A Contusion of left wrist, initial encounter: Secondary | ICD-10-CM | POA: Diagnosis not present

## 2021-12-02 DIAGNOSIS — M25532 Pain in left wrist: Secondary | ICD-10-CM | POA: Diagnosis not present

## 2021-12-22 ENCOUNTER — Ambulatory Visit: Payer: Self-pay | Admitting: *Deleted

## 2021-12-22 NOTE — Telephone Encounter (Signed)
Summary: head/ chest congestion and cough   Pt states she has head/ chest congestion and cough  Pt requesting a Rx  Please assist further           Chief Complaint: requesting medication. C/o URI sx since 12/14/21 Symptoms: headache, facial pain better, runny nose, blowing nose for yellow mucus. Cough , chest congestion. Not productive. Shortness of breath with exertion. C/o facial pain and headaches, taking mucinex - helps some.  Frequency: 12/14/21 Pertinent Negatives: Patient denies chest pain no difficulty breathing no fever reported.  Disposition: '[]'$ ED /'[]'$ Urgent Care (no appt availability in office) / '[x]'$ Appointment(In office/virtual)/ '[]'$  [facility]/ '[]'$ Home Care/ '[]'$ Refused Recommended Disposition /'[]'$ [facility]/ '[]'$  Follow-up with PCP Additional Notes:   Appt scheduled for 12/23/21.        Reason for Disposition  [1] Using nasal washes and pain medicine > 24 hours AND [2] sinus pain (around cheekbone or eye) persists    No reports of using nasal washes  Answer Assessment - Initial Assessment Questions 1. ONSET: "When did the cough begin?"      Last Sunday  12/14/21 2. SEVERITY: "How bad is the cough today?"      Not getting better  3. SPUTUM: "Describe the color of your sputum" (none, dry cough; clear, white, yellow, green)     None . Blowing nose for yellow mucus  4. HEMOPTYSIS: "Are you coughing up any blood?" If so ask: "How much?" (flecks, streaks, tablespoons, etc.)     na 5. DIFFICULTY BREATHING: "Are you having difficulty breathing?" If Yes, ask: "How bad is it?" (e.g., mild, moderate, severe)    - MILD: No SOB at rest, mild SOB with walking, speaks normally in sentences, can lie down, no retractions, pulse < 100.    - MODERATE: SOB at rest, SOB with minimal exertion and prefers to sit, cannot lie down flat, speaks in phrases, mild retractions, audible wheezing, pulse 100-120.    - SEVERE: Very SOB at rest, speaks in single words,  struggling to breathe, sitting hunched forward, retractions, pulse > 120      Shortness of breath with exertion.  6. FEVER: "Do you have a fever?" If Yes, ask: "What is your temperature, how was it measured, and when did it start?"     no 7. CARDIAC HISTORY: "Do you have any history of heart disease?" (e.g., heart attack, congestive heart failure)      See hx  8. LUNG HISTORY: "Do you have any history of lung disease?"  (e.g., pulmonary embolus, asthma, emphysema)     na 9. PE RISK FACTORS: "Do you have a history of blood clots?" (or: recent major surgery, recent prolonged travel, bedridden)     na 10. OTHER SYMPTOMS: "Do you have any other symptoms?" (e.g., runny nose, wheezing, chest pain)       Runny nose blowing for yellow mucus. Facial pain and headache. Head/ chest congestion 11. PREGNANCY: "Is there any chance you are pregnant?" "When was your last menstrual period?"       na 12. TRAVEL: "Have you traveled out of the country in the last month?" (e.g., travel history, exposures)       na  Protocols used: Cough - Acute Productive-A-AH

## 2021-12-23 ENCOUNTER — Encounter: Payer: Self-pay | Admitting: Family Medicine

## 2021-12-23 ENCOUNTER — Ambulatory Visit (INDEPENDENT_AMBULATORY_CARE_PROVIDER_SITE_OTHER): Payer: Medicare Other | Admitting: Family Medicine

## 2021-12-23 VITALS — BP 162/96 | HR 75 | Temp 98.1°F | Resp 16 | Wt 157.0 lb

## 2021-12-23 DIAGNOSIS — J432 Centrilobular emphysema: Secondary | ICD-10-CM

## 2021-12-23 DIAGNOSIS — Z720 Tobacco use: Secondary | ICD-10-CM | POA: Diagnosis not present

## 2021-12-23 DIAGNOSIS — R051 Acute cough: Secondary | ICD-10-CM

## 2021-12-23 LAB — POC COVID19 BINAXNOW: SARS Coronavirus 2 Ag: NEGATIVE

## 2021-12-23 MED ORDER — PREDNISONE 20 MG PO TABS: 40.0000 mg | ORAL_TABLET | Freq: Every day | ORAL | 0 refills | Status: AC

## 2021-12-23 MED ORDER — AMOXICILLIN-POT CLAVULANATE 875-125 MG PO TABS: 1.0000 | ORAL_TABLET | Freq: Two times a day (BID) | ORAL | 0 refills | Status: AC

## 2021-12-23 NOTE — Assessment & Plan Note (Signed)
With current exacerbation, likely precipitated by viral URI. Also with some missed doses of trelegy, recommend resuming. COVID negative. Will provide steroid burst and augmentin. Reviewed return and emergency precautions.

## 2021-12-23 NOTE — Assessment & Plan Note (Signed)
Recommend cessation. ?

## 2021-12-23 NOTE — Progress Notes (Signed)
   SUBJECTIVE:   CHIEF COMPLAINT / HPI:   UPPER RESPIRATORY TRACT INFECTION - symptom onset 11/19 - has not tested for COVID - h/o emphysema on trelegy. Some missed doses since being sick.   Fever: no Cough: yes Shortness of breath:  some with exertion Wheezing: yes Chest pain: no Chest tightness: no Chest congestion: yes Nasal congestion: yes Runny nose: yes Sore throat: no Sinus pressure: yes Headache: yes Face pain: yes Vomiting: few episodes of post-tussive vomiting.  Sick contacts: yes Relief with OTC cold/cough medications: some relief with mucinex.  Treatments attempted: mucinex    OBJECTIVE:   BP (!) 162/96   Pulse 75   Temp 98.1 F (36.7 C) (Oral)   Resp 16   Wt 157 lb (71.2 kg)   SpO2 98%   BMI 23.18 kg/m   Gen: well appearing, in NAD Card: RRR Lungs: expiratory wheezing in lower lobes b/l Ext: WWP, no edema   ASSESSMENT/PLAN:   Centrilobular emphysema (HCC) With current exacerbation, likely precipitated by viral URI. Also with some missed doses of trelegy, recommend resuming. COVID negative. Will provide steroid burst and augmentin. Reviewed return and emergency precautions.   Tobacco abuse Recommend cessation.   Elevated BP Elevated today and on recheck though improved. Prior measurements at goal despite not taking prescribed maxzide for the past year. No diagnosis on HTN on problem list and reports not having h/o HTN, patient unsure what maxzide was prescribed for. Recommend keeping home BP log and RTC in 1-2 months for recheck with home log.   Myles Gip, DO

## 2022-01-05 ENCOUNTER — Ambulatory Visit: Payer: Self-pay | Admitting: *Deleted

## 2022-01-05 NOTE — Telephone Encounter (Signed)
Patient advised.

## 2022-01-05 NOTE — Telephone Encounter (Signed)
Summary: chest congestion and headache   Pt states she has chest congestion and an headache  Pt requesting a cb from a nurse  Please assist further       Chief Complaint: cough congestion still noted. Last seen 12/23/21 by A. Rumball Symptoms: runny nose blowing nose for yellow green mucus. Cough yellow / green mucus. Wheezing reported at times. Completed course of steroids / amoxicillin and sx continue. Covid test negative. Frequency: greater than 1 week. Pertinent Negatives: Patient denies chest pain no difficulty breathing ,no fever Disposition: '[]'$ ED /'[]'$ Urgent Care (no appt availability in office) / '[]'$ Appointment(In office/virtual)/ '[]'$  [facility]/ '[]'$ Home Care/ '[]'$ Refused Recommended Disposition /'[]'$ [facility]/ '[x]'$  Follow-up with PCP Additional Notes:   Please advise if appt needed. Patient reports she has already been seen 12/23/21 for same issues/ sx. Please advise if medication can be prescribed .       Reason for Disposition  Cough has been present for > 3 weeks    X 1 week  Answer Assessment - Initial Assessment Questions 1. ONSET: "When did the cough begin?"      Over 1 week ago  2. SEVERITY: "How bad is the cough today?"      Still coughing since being seen 12/23/21 3. SPUTUM: "Describe the color of your sputum" (none, dry cough; clear, white, yellow, green)     Yellow and green 4. HEMOPTYSIS: "Are you coughing up any blood?" If so ask: "How much?" (flecks, streaks, tablespoons, etc.)     no 5. DIFFICULTY BREATHING: "Are you having difficulty breathing?" If Yes, ask: "How bad is it?" (e.g., mild, moderate, severe)    - MILD: No SOB at rest, mild SOB with walking, speaks normally in sentences, can lie down, no retractions, pulse < 100.    - MODERATE: SOB at rest, SOB with minimal exertion and prefers to sit, cannot lie down flat, speaks in phrases, mild retractions, audible wheezing, pulse 100-120.    - SEVERE: Very SOB at rest, speaks in single  words, struggling to breathe, sitting hunched forward, retractions, pulse > 120      Mild. Some shortness of breath but not bad 6. FEVER: "Do you have a fever?" If Yes, ask: "What is your temperature, how was it measured, and when did it start?"     na 7. CARDIAC HISTORY: "Do you have any history of heart disease?" (e.g., heart attack, congestive heart failure)      na 8. LUNG HISTORY: "Do you have any history of lung disease?"  (e.g., pulmonary embolus, asthma, emphysema)     na 9. PE RISK FACTORS: "Do you have a history of blood clots?" (or: recent major surgery, recent prolonged travel, bedridden)     na 10. OTHER SYMPTOMS: "Do you have any other symptoms?" (e.g., runny nose, wheezing, chest pain)       Runny nose wheezing at times cough yellow and green mucus. Last seen 12/23/21 completed dose of steroids and amoxicillin 11. PREGNANCY: "Is there any chance you are pregnant?" "When was your last menstrual period?"       na 12. TRAVEL: "Have you traveled out of the country in the last month?" (e.g., travel history, exposures)       na  Protocols used: Cough - Acute Productive-A-AH

## 2022-01-06 ENCOUNTER — Ambulatory Visit (INDEPENDENT_AMBULATORY_CARE_PROVIDER_SITE_OTHER): Payer: Medicare Other | Admitting: Family Medicine

## 2022-01-06 ENCOUNTER — Encounter: Payer: Self-pay | Admitting: Family Medicine

## 2022-01-06 ENCOUNTER — Ambulatory Visit: Payer: Medicare Other | Admitting: Family Medicine

## 2022-01-06 VITALS — BP 117/62 | HR 71 | Temp 98.1°F | Resp 16 | Ht 70.0 in | Wt 154.0 lb

## 2022-01-06 DIAGNOSIS — J329 Chronic sinusitis, unspecified: Secondary | ICD-10-CM

## 2022-01-06 DIAGNOSIS — J441 Chronic obstructive pulmonary disease with (acute) exacerbation: Secondary | ICD-10-CM | POA: Diagnosis not present

## 2022-01-06 DIAGNOSIS — B9689 Other specified bacterial agents as the cause of diseases classified elsewhere: Secondary | ICD-10-CM | POA: Diagnosis not present

## 2022-01-06 DIAGNOSIS — H6123 Impacted cerumen, bilateral: Secondary | ICD-10-CM | POA: Insufficient documentation

## 2022-01-06 MED ORDER — PREDNISONE 50 MG PO TABS: 50.0000 mg | ORAL_TABLET | Freq: Every day | ORAL | 0 refills | Status: DC

## 2022-01-06 MED ORDER — LEVOFLOXACIN 500 MG PO TABS: 500.0000 mg | ORAL_TABLET | Freq: Every day | ORAL | 0 refills | Status: DC

## 2022-01-06 NOTE — Assessment & Plan Note (Signed)
Reports active wheezing and excess mucus in setting of sinusitis Will repeat prednisone burst treatment Recommend compliance with inhalers, goal of tobacco reduction for cessation and f/u with Xray and pulm consult if symptoms do not resolve

## 2022-01-06 NOTE — Assessment & Plan Note (Signed)
Acute on chronic per pt report Pt declines irrigation at this time Encourage supportive medications of anti-histamine like zyrtec or claritin to assist as well as flonase, intra-nasal steroid

## 2022-01-06 NOTE — Progress Notes (Signed)
Established patient visit   Patient: Jennifer [name]   DOB: 27-May-1950   71 y.o. Female  MRN: [id number] Visit Date: 01/06/2022  Today's healthcare provider: Gwyneth Sprout, FNP  Re Introduced to nurse practitioner role and practice setting.  All questions answered.  Discussed provider/patient relationship and expectations.  I,Tiffany J Bragg,acting as a scribe for Gwyneth Sprout, FNP.,have documented all relevant documentation on the behalf of Gwyneth Sprout, FNP,as directed by  Gwyneth Sprout, FNP while in the presence of Gwyneth Sprout, FNP.  Chief Complaint  Patient presents with   Sinus Problem    Patient complains of sinus headache, congestion, productive cough, for a month.    Subjective    HPI HPI     Sinus Problem    Additional comments: Patient complains of sinus headache, congestion, productive cough, for a month.       Last edited by Smitty Knudsen, CMA on 01/06/2022 11:10 AM.      Medications: Outpatient Medications Prior to Visit  Medication Sig   alendronate (FOSAMAX) 70 MG tablet TAKE 1 TABLET EVERY WEEK  ON  AN  EMPTY  STOMACH WITH A FULL GLASS OF WATER   atorvastatin (LIPITOR) 10 MG tablet TAKE 1 TABLET EVERY DAY   b complex vitamins capsule Take 1 capsule by mouth daily.   Calcium Citrate-Vitamin D 315-5 MG-MCG TABS    Fluticasone-Umeclidin-Vilant (TRELEGY ELLIPTA) 100-62.5-25 MCG/ACT AEPB Inhale 1 puff into the lungs daily.   triamterene-hydrochlorothiazide (MAXZIDE-25) 37.5-25 MG tablet Take 1 tablet by mouth daily.   No facility-administered medications prior to visit.    Review of Systems    Objective    BP 117/62 (BP Location: Left Arm, Patient Position: Sitting, Cuff Size: Normal)   Pulse 71   Temp 98.1 F (36.7 C) (Oral)   Resp 16   Ht '5\' 10"'$  (1.778 m)   Wt 154 lb (69.9 kg)   SpO2 98%   BMI 22.10 kg/m   Physical Exam Vitals and nursing note reviewed.  Constitutional:      General: She is not in acute distress.    Appearance:  Normal appearance. She is normal weight. She is not ill-appearing, toxic-appearing or diaphoretic.  HENT:     Head: Normocephalic and atraumatic.     Right Ear: Tympanic membrane, ear canal and external ear normal. There is no impacted cerumen.     Left Ear: Tympanic membrane and ear canal normal. There is no impacted cerumen.     Nose: Congestion present. No rhinorrhea.     Right Sinus: Maxillary sinus tenderness and frontal sinus tenderness present.     Left Sinus: Maxillary sinus tenderness and frontal sinus tenderness present.     Mouth/Throat:     Mouth: Mucous membranes are moist.     Pharynx: Oropharynx is clear. No oropharyngeal exudate or posterior oropharyngeal erythema.  Eyes:     Conjunctiva/sclera: Conjunctivae normal.     Pupils: Pupils are equal, round, and reactive to light.  Cardiovascular:     Rate and Rhythm: Normal rate and regular rhythm.     Pulses: Normal pulses.     Heart sounds: Normal heart sounds. No murmur heard.    No friction rub. No gallop.  Pulmonary:     Effort: Pulmonary effort is normal. No respiratory distress.     Breath sounds: Normal breath sounds. No stridor. No wheezing, rhonchi or rales.  Chest:     Chest wall: No tenderness.  Abdominal:  General: Bowel sounds are normal.     Palpations: Abdomen is soft.  Musculoskeletal:        General: No swelling, tenderness, deformity or signs of injury. Normal range of motion.     Right lower leg: No edema.     Left lower leg: No edema.  Skin:    General: Skin is warm and dry.     Capillary Refill: Capillary refill takes less than 2 seconds.     Coloration: Skin is not jaundiced or pale.     Findings: No bruising, erythema, lesion or rash.  Neurological:     General: No focal deficit present.     Mental Status: She is alert and oriented to person, place, and time. Mental status is at baseline.     Cranial Nerves: No cranial nerve deficit.     Sensory: No sensory deficit.     Motor: No  weakness.     Coordination: Coordination normal.  Psychiatric:        Mood and Affect: Mood normal.        Behavior: Behavior normal.        Thought Content: Thought content normal.        Judgment: Judgment normal.     No results found for any visits on 01/06/22.  Assessment & Plan     Problem List Items Addressed This Visit       Respiratory   Bacterial sinusitis - Primary    Acute following URI previously; was treated with Augmentin however did not clear symptoms Reports multiple sick contacts and friends in retirement community Symptoms have worsened with yellow sputum from nose and able to cough up yellow sputum as well      Relevant Medications   levofloxacin (LEVAQUIN) 500 MG tablet   predniSONE (DELTASONE) 50 MG tablet   COPD with acute exacerbation (HCC)    Reports active wheezing and excess mucus in setting of sinusitis Will repeat prednisone burst treatment Recommend compliance with inhalers, goal of tobacco reduction for cessation and f/u with Xray and pulm consult if symptoms do not resolve       Relevant Medications   levofloxacin (LEVAQUIN) 500 MG tablet   predniSONE (DELTASONE) 50 MG tablet     Nervous and Auditory   Excessive cerumen in both ear canals    Acute on chronic per pt report Pt declines irrigation at this time Encourage supportive medications of anti-histamine like zyrtec or claritin to assist as well as flonase, intra-nasal steroid       Return if symptoms worsen or fail to improve.     Vonna Kotyk, FNP, have reviewed all documentation for this visit. The documentation on 01/06/22 for the exam, diagnosis, procedures, and orders are all accurate and complete.  Gwyneth Sprout, [place] [phone] (phone) [phone] (fax)  [place]

## 2022-01-06 NOTE — Assessment & Plan Note (Signed)
Acute following URI previously; was treated with Augmentin however did not clear symptoms Reports multiple sick contacts and friends in retirement community Symptoms have worsened with yellow sputum from nose and able to cough up yellow sputum as well

## 2022-01-28 ENCOUNTER — Other Ambulatory Visit: Payer: Self-pay | Admitting: Family Medicine

## 2022-01-28 DIAGNOSIS — E7800 Pure hypercholesterolemia, unspecified: Secondary | ICD-10-CM

## 2022-01-28 DIAGNOSIS — J432 Centrilobular emphysema: Secondary | ICD-10-CM

## 2022-01-28 MED ORDER — ATORVASTATIN CALCIUM 10 MG PO TABS: 10.0000 mg | ORAL_TABLET | Freq: Every day | ORAL | 1 refills | Status: DC

## 2022-01-28 NOTE — Telephone Encounter (Signed)
Medication Refill - Medication: atorvastatin (LIPITOR) 10 MG tablet   Fluticasone-Umeclidin-Vilant (TRELEGY ELLIPTA) 100-62.5-25 MCG/ACT AEPB [[id number]]  Needs new RX due to using different pharmacy   Has the patient contacted their pharmacy? No. (Agent: If no, request that the patient contact the pharmacy for the refill. If patient does not wish to contact the pharmacy document the reason why and proceed with request.) (Agent: If yes, when and what did the pharmacy advise?)  Preferred Pharmacy (with phone number or street name):  [facility] (N), [place] - [place]     Has the patient been seen for an appointment in the last year OR does the patient have an upcoming appointment? Yes.    Agent: Please be advised that RX refills may take up to 3 business days. We ask that you follow-up with your pharmacy.

## 2022-01-28 NOTE — Telephone Encounter (Signed)
Requested medication (s) are due for refill today: No  Requested medication (s) are on the active medication list: yes    Last refill: 05/16/21 1 ea  11 refills  Future visit scheduled yes 02/10/22  Notes to clinic: Off protocol, please review, thank you.  Requested Prescriptions  Pending Prescriptions Disp Refills   Fluticasone-Umeclidin-Vilant (TRELEGY ELLIPTA) 100-62.5-25 MCG/ACT AEPB 1 each 11    Sig: Inhale 1 puff into the lungs daily.     Off-Protocol Failed - 01/28/2022  9:27 AM      Failed - Medication not assigned to a protocol, review manually.      Passed - Valid encounter within last 12 months    Recent Outpatient Visits           3 weeks ago Bacterial sinusitis   [facility] Gwyneth Sprout, FNP   1 month ago Acute cough   [facility] Myles Gip, DO   2 months ago Centrilobular emphysema [facility])   [facility] Gwyneth Sprout, FNP   8 months ago Annual physical exam   [facility] Tally Joe T, FNP   9 months ago Acute non-recurrent pansinusitis   [facility] Gwyneth Sprout, FNP       Future Appointments             In 1 week Ralene Bathe, MD [place]   In 1 week Gwyneth Sprout, [place], [facility]            Signed Prescriptions Disp Refills   atorvastatin (LIPITOR) 10 MG tablet 90 tablet 1    Sig: Take 1 tablet (10 mg total) by mouth daily.     Cardiovascular:  Antilipid - Statins Failed - 01/28/2022  9:27 AM      Failed - Lipid Panel in normal range within the last 12 months    Cholesterol, Total  Date Value Ref Range Status  05/12/2021 162 100 - 199 mg/dL Final   LDL Chol Calc (NIH)  Date Value Ref Range Status  05/12/2021 79 0 - 99 mg/dL Final   HDL  Date Value Ref Range Status  05/12/2021 66 >39 mg/dL Final   Triglycerides  Date Value Ref Range Status  05/12/2021 94 0 - 149 mg/dL Final         Passed - Patient is not  pregnant      Passed - Valid encounter within last 12 months    Recent Outpatient Visits           3 weeks ago Bacterial sinusitis   [facility] Gwyneth Sprout, FNP   1 month ago Acute cough   [facility] Myles Gip, DO   2 months ago Centrilobular emphysema [facility])   [facility] Gwyneth Sprout, FNP   8 months ago Annual physical exam   [facility] Tally Joe T, FNP   9 months ago Acute non-recurrent pansinusitis   [facility] Gwyneth Sprout, FNP       Future Appointments             In 1 week Ralene Bathe, MD [place]   In 1 week Gwyneth Sprout, [place], [place]

## 2022-01-28 NOTE — Telephone Encounter (Signed)
Requested Prescriptions  Pending Prescriptions Disp Refills   Fluticasone-Umeclidin-Vilant (TRELEGY ELLIPTA) 100-62.5-25 MCG/ACT AEPB 1 each 11    Sig: Inhale 1 puff into the lungs daily.     Off-Protocol Failed - 01/28/2022  9:27 AM      Failed - Medication not assigned to a protocol, review manually.      Passed - Valid encounter within last 12 months    Recent Outpatient Visits           3 weeks ago Bacterial sinusitis   [facility] Gwyneth Sprout, FNP   1 month ago Acute cough   [facility] Myles Gip, DO   2 months ago Centrilobular emphysema [facility])   [facility] Gwyneth Sprout, FNP   8 months ago Annual physical exam   [facility] Tally Joe T, FNP   9 months ago Acute non-recurrent pansinusitis   [facility] Gwyneth Sprout, FNP       Future Appointments             In 1 week Ralene Bathe, MD [place]   In 1 week Gwyneth Sprout, [place], [facility]             atorvastatin (LIPITOR) 10 MG tablet 90 tablet 1    Sig: Take 1 tablet (10 mg total) by mouth daily.     Cardiovascular:  Antilipid - Statins Failed - 01/28/2022  9:27 AM      Failed - Lipid Panel in normal range within the last 12 months    Cholesterol, Total  Date Value Ref Range Status  05/12/2021 162 100 - 199 mg/dL Final   LDL Chol Calc (NIH)  Date Value Ref Range Status  05/12/2021 79 0 - 99 mg/dL Final   HDL  Date Value Ref Range Status  05/12/2021 66 >39 mg/dL Final   Triglycerides  Date Value Ref Range Status  05/12/2021 94 0 - 149 mg/dL Final         Passed - Patient is not pregnant      Passed - Valid encounter within last 12 months    Recent Outpatient Visits           3 weeks ago Bacterial sinusitis   [facility] Gwyneth Sprout, FNP   1 month ago Acute cough   [facility] Myles Gip, DO   2 months ago Centrilobular emphysema  [facility])   [facility] Gwyneth Sprout, FNP   8 months ago Annual physical exam   [facility] Tally Joe T, FNP   9 months ago Acute non-recurrent pansinusitis   [facility] Gwyneth Sprout, FNP       Future Appointments             In 1 week Ralene Bathe, MD [place]   In 1 week Gwyneth Sprout, [place], [place]

## 2022-01-29 MED ORDER — TRELEGY ELLIPTA 100-62.5-25 MCG/ACT IN AEPB: 1.0000 | INHALATION_SPRAY | Freq: Every day | RESPIRATORY_TRACT | 11 refills | Status: DC

## 2022-02-05 ENCOUNTER — Ambulatory Visit: Payer: Medicare HMO | Admitting: Dermatology

## 2022-02-10 ENCOUNTER — Ambulatory Visit: Payer: Medicare Other | Admitting: Family Medicine

## 2022-04-06 DIAGNOSIS — M810 Age-related osteoporosis without current pathological fracture: Secondary | ICD-10-CM | POA: Diagnosis not present

## 2022-04-09 DIAGNOSIS — E559 Vitamin D deficiency, unspecified: Secondary | ICD-10-CM | POA: Diagnosis not present

## 2022-04-09 DIAGNOSIS — F17200 Nicotine dependence, unspecified, uncomplicated: Secondary | ICD-10-CM | POA: Diagnosis not present

## 2022-04-09 DIAGNOSIS — M810 Age-related osteoporosis without current pathological fracture: Secondary | ICD-10-CM | POA: Diagnosis not present

## 2022-04-20 ENCOUNTER — Other Ambulatory Visit: Payer: Self-pay | Admitting: Acute Care

## 2022-04-20 DIAGNOSIS — F17210 Nicotine dependence, cigarettes, uncomplicated: Secondary | ICD-10-CM

## 2022-04-20 DIAGNOSIS — Z87891 Personal history of nicotine dependence: Secondary | ICD-10-CM

## 2022-04-21 ENCOUNTER — Ambulatory Visit: Payer: 59

## 2022-04-29 ENCOUNTER — Ambulatory Visit
Admission: RE | Admit: 2022-04-29 | Discharge: 2022-04-29 | Disposition: A | Discharge status: Home / Self Care | Payer: 59 | Source: Ambulatory Visit | Attending: Acute Care | Admitting: Acute Care

## 2022-04-29 DIAGNOSIS — F17210 Nicotine dependence, cigarettes, uncomplicated: Secondary | ICD-10-CM | POA: Diagnosis not present

## 2022-04-29 DIAGNOSIS — Z87891 Personal history of nicotine dependence: Secondary | ICD-10-CM | POA: Insufficient documentation

## 2022-05-01 ENCOUNTER — Other Ambulatory Visit: Payer: Self-pay

## 2022-05-01 DIAGNOSIS — F17210 Nicotine dependence, cigarettes, uncomplicated: Secondary | ICD-10-CM

## 2022-05-01 DIAGNOSIS — Z87891 Personal history of nicotine dependence: Secondary | ICD-10-CM

## 2022-05-13 NOTE — Progress Notes (Unsigned)
Complete physical exam   Patient: Jennifer [name]   DOB: 1950/06/28   72 y.o. Female  MRN: [id number] Visit Date: 05/14/2022  Today's healthcare provider: Jacky Kindle, FNP  Re Introduced to nurse practitioner role and practice setting.  All questions answered.  Discussed provider/patient relationship and expectations.   Chief Complaint  Patient presents with   Annual Exam   Subjective    Jennifer [name] is a 72 y.o. female who presents today for a complete physical exam.  She reports consuming a general diet. Gym/ health club routine includes light weights, treadmill, and walking on track . She generally feels well. She reports sleeping poorly, patient has a hard time falling and staying asleep. She does not have additional problems to discuss today.  HPI    Past Medical History:  Diagnosis Date   Arthritis    mild   Cancer    skin cancer leg and chest - Dr. Jarold Motto, and [facility]   Dysrhythmia    palpitation   Family history of colon cancer    Headache    as  a child    Heart murmur    while pregnant   Hyperlipidemia 07/17/2017   Tobacco abuse    Past Surgical History:  Procedure Laterality Date   ABDOMINAL HYSTERECTOMY     1996   COLONOSCOPY WITH PROPOFOL N/A 03/29/2017   Procedure: COLONOSCOPY WITH PROPOFOL;  Surgeon: Toney Reil, MD;  Location: [facility] ENDOSCOPY;  Service: Gastroenterology;  Laterality: N/A;   COLONOSCOPY WITH PROPOFOL N/A 03/04/2020   Procedure: COLONOSCOPY WITH PROPOFOL;  Surgeon: Toney Reil, MD;  Location: [facility] ENDOSCOPY;  Service: Gastroenterology;  Laterality: N/A;   HEMICOLECTOMY     righ  t6-21-19   LAPAROSCOPIC RIGHT HEMI COLECTOMY Right 07/16/2017   Procedure: LAPAROSCOPIC RIGHT HEMI COLECTOMY;  Surgeon: Andria Meuse, MD;  Location: [facility] ORS;  Service: General;  Laterality: Right;   TUBAL LIGATION     Social History   Socioeconomic History   Marital status: Divorced    Spouse name: Not on file   Number of  children: 1   Years of education: Not on file   Highest education level: GED or equivalent  Occupational History   Occupation: retired  Tobacco Use   Smoking status: Every Day    Packs/day: 1.00    Years: 46.35    Additional pack years: 0.00    Total pack years: 46.35    Types: Cigarettes   Smokeless tobacco: Never  Vaping Use   Vaping Use: Never used  Substance and Sexual Activity   Alcohol use: Not Currently   Drug use: No   Sexual activity: Not Currently  Other Topics Concern   Not on file  Social History Narrative   Not on file   Social Determinants of Health   Financial Resource Strain: Low Risk  (05/10/2022)   Overall Financial Resource Strain (CARDIA)    Difficulty of Paying Living Expenses: Not hard at all  Food Insecurity: No Food Insecurity (05/10/2022)   Hunger Vital Sign    Worried About Running Out of Food in the Last Year: Never true    Ran Out of Food in the Last Year: Never true  Transportation Needs: No Transportation Needs (05/10/2022)   PRAPARE - Administrator, Civil Service (Medical): No    Lack of Transportation (Non-Medical): No  Physical Activity: Insufficiently Active (05/10/2022)   Exercise Vital Sign    Days of Exercise per  Week: 3 days    Minutes of Exercise per Session: 10 min  Stress: No Stress Concern Present (05/10/2022)   [employer] of Occupational Health - Occupational Stress Questionnaire    Feeling of Stress : Only a little  Social Connections: Unknown (05/10/2022)   Social Connection and Isolation Panel [NHANES]    Frequency of Communication with Friends and Family: More than three times a week    Frequency of Social Gatherings with Friends and Family: More than three times a week    Attends Religious Services: Patient declined    Active Member of Clubs or Organizations: No    Attends Banker Meetings: Never    Marital Status: Divorced  Catering manager Violence: Not At Risk (07/08/2021)   Humiliation,  Afraid, Rape, and Kick questionnaire    Fear of Current or Ex-Partner: No    Emotionally Abused: No    Physically Abused: No    Sexually Abused: No   Family Status  Relation Name Status   Mother  Deceased   Father  Deceased at age 24   Son  Deceased   Pat Aunt  Deceased at age 16   Pat Uncle  Deceased   MGM  Deceased   MGF  Deceased   PGM  Deceased   Neg Hx  (Not Specified)   Family History  Problem Relation Age of Onset   Diabetes Mother 47       12/2020   Kidney disease Mother    Heart attack Father    Colon cancer Paternal Aunt 26   Colon cancer Paternal Uncle        dx >50, colon completely removed   Diabetes Maternal Grandmother    Colon cancer Maternal Grandfather    Heart disease Paternal Grandmother    Breast cancer Neg Hx    No Known Allergies  Patient Care Team: Jacky Kindle, FNP as PCP - General (Family Medicine) Sheets, Ivin Booty, LCSW as Counselor (Licensed Clinical Social Worker) Dingeldein, Viviann Spare, MD (Ophthalmology) Harcourt, Birchwood M, LCSW as Social Worker   Medications: Outpatient Medications Prior to Visit  Medication Sig   alendronate (FOSAMAX) 70 MG tablet TAKE 1 TABLET EVERY WEEK  ON  AN  EMPTY  STOMACH WITH A FULL GLASS OF WATER   atorvastatin (LIPITOR) 10 MG tablet Take 1 tablet (10 mg total) by mouth daily.   b complex vitamins capsule Take 1 capsule by mouth daily.   Calcium Citrate-Vitamin D 315-5 MG-MCG TABS    Fluticasone-Umeclidin-Vilant (TRELEGY ELLIPTA) 100-62.5-25 MCG/ACT AEPB Inhale 1 puff into the lungs daily.   [DISCONTINUED] triamterene-hydrochlorothiazide (MAXZIDE-25) 37.5-25 MG tablet Take 1 tablet by mouth daily.   [DISCONTINUED] levofloxacin (LEVAQUIN) 500 MG tablet Take 1 tablet (500 mg total) by mouth daily.   [DISCONTINUED] predniSONE (DELTASONE) 50 MG tablet Take 1 tablet (50 mg total) by mouth daily with breakfast.   No facility-administered medications prior to visit.    Review of Systems  Constitutional:  Positive for  diaphoresis.  HENT:  Positive for congestion.   Eyes:  Positive for itching.  Respiratory:  Positive for cough.   Genitourinary:  Positive for vaginal discharge.    Objective    BP 125/74 (BP Location: Left Arm, Patient Position: Sitting, Cuff Size: Normal)   Pulse 73   Temp 98.3 F (36.8 C) (Oral)   Resp 12   Ht 5\' 8"  (1.727 m)   Wt 144 lb 11.2 oz (65.6 kg)   SpO2 99%   BMI 22.00 kg/m  Physical Exam Vitals and nursing note reviewed.  Constitutional:      General: She is awake. She is not in acute distress.    Appearance: Normal appearance. She is well-developed, well-groomed and normal weight. She is not ill-appearing, toxic-appearing or diaphoretic.  HENT:     Head: Normocephalic and atraumatic.     Jaw: There is normal jaw occlusion. No trismus, tenderness, swelling or pain on movement.     Right Ear: Hearing, tympanic membrane, ear canal and external ear normal. There is no impacted cerumen.     Left Ear: Hearing, tympanic membrane, ear canal and external ear normal. There is no impacted cerumen.     Nose: Nose normal. No congestion or rhinorrhea.     Right Turbinates: Not enlarged, swollen or pale.     Left Turbinates: Not enlarged, swollen or pale.     Right Sinus: No maxillary sinus tenderness or frontal sinus tenderness.     Left Sinus: No maxillary sinus tenderness or frontal sinus tenderness.     Mouth/Throat:     Lips: Pink.     Mouth: Mucous membranes are moist. No injury.     Tongue: No lesions.     Pharynx: Oropharynx is clear. Uvula midline. No pharyngeal swelling, oropharyngeal exudate, posterior oropharyngeal erythema or uvula swelling.     Tonsils: No tonsillar exudate or tonsillar abscesses.  Eyes:     General: Lids are normal. Lids are everted, no foreign bodies appreciated. Vision grossly intact. Gaze aligned appropriately. No allergic shiner or visual field deficit.       Right eye: No discharge.        Left eye: No discharge.     Extraocular  Movements: Extraocular movements intact.     Conjunctiva/sclera: Conjunctivae normal.     Right eye: Right conjunctiva is not injected. No exudate.    Left eye: Left conjunctiva is not injected. No exudate.    Pupils: Pupils are equal, round, and reactive to light.  Neck:     Thyroid: No thyroid mass, thyromegaly or thyroid tenderness.     Vascular: No carotid bruit.     Trachea: Trachea normal.  Cardiovascular:     Rate and Rhythm: Normal rate and regular rhythm.     Pulses: Normal pulses.          Carotid pulses are 2+ on the right side and 2+ on the left side.      Radial pulses are 2+ on the right side and 2+ on the left side.       Dorsalis pedis pulses are 2+ on the right side and 2+ on the left side.       Posterior tibial pulses are 2+ on the right side and 2+ on the left side.     Heart sounds: Normal heart sounds, S1 normal and S2 normal. No murmur heard.    No friction rub. No gallop.  Pulmonary:     Effort: Pulmonary effort is normal. No respiratory distress.     Breath sounds: Normal breath sounds and air entry. No stridor. No wheezing, rhonchi or rales.  Chest:     Chest wall: No tenderness.  Abdominal:     General: Abdomen is flat. Bowel sounds are normal. There is no distension.     Palpations: Abdomen is soft. There is no mass.     Tenderness: There is no abdominal tenderness. There is no right CVA tenderness, left CVA tenderness, guarding or rebound.     Hernia: No hernia  is present.  Genitourinary:    Comments: Exam deferred; denies complaints Musculoskeletal:        General: No swelling, tenderness, deformity or signs of injury. Normal range of motion.     Cervical back: Full passive range of motion without pain, normal range of motion and neck supple. No edema, rigidity or tenderness. No muscular tenderness.     Right lower leg: No edema.     Left lower leg: No edema.  Lymphadenopathy:     Cervical: No cervical adenopathy.     Right cervical: No superficial,  deep or posterior cervical adenopathy.    Left cervical: No superficial, deep or posterior cervical adenopathy.  Skin:    General: Skin is warm and dry.     Capillary Refill: Capillary refill takes less than 2 seconds.     Coloration: Skin is not jaundiced or pale.     Findings: No bruising, erythema, lesion or rash.  Neurological:     General: No focal deficit present.     Mental Status: She is alert and oriented to person, place, and time. Mental status is at baseline.     GCS: GCS eye subscore is 4. GCS verbal subscore is 5. GCS motor subscore is 6.     Sensory: Sensation is intact. No sensory deficit.     Motor: Motor function is intact. No weakness.     Coordination: Coordination is intact. Coordination normal.     Gait: Gait is intact. Gait normal.  Psychiatric:        Attention and Perception: Attention and perception normal.        Mood and Affect: Mood and affect normal.        Speech: Speech normal.        Behavior: Behavior normal. Behavior is cooperative.        Thought Content: Thought content normal.        Cognition and Memory: Cognition and memory normal.        Judgment: Judgment normal.     Last depression screening scores    05/14/2022    1:45 PM 01/06/2022   11:11 AM 11/12/2021    1:11 PM  PHQ 2/9 Scores  PHQ - 2 Score 1 1 0  PHQ- 9 Score 4 5 6    Last fall risk screening    05/14/2022    1:45 PM  Fall Risk   Falls in the past year? 0  Number falls in past yr: 0  Injury with Fall? 0  Risk for fall due to : No Fall Risks  Follow up Falls evaluation completed   Last Audit-C alcohol use screening    05/14/2022    1:45 PM  Alcohol Use Disorder Test (AUDIT)  1. How often do you have a drink containing alcohol? 0  2. How many drinks containing alcohol do you have on a typical day when you are drinking? 0  3. How often do you have six or more drinks on one occasion? 0  AUDIT-C Score 0   A score of 3 or more in women, and 4 or more in men indicates  increased risk for alcohol abuse, EXCEPT if all of the points are from question 1   No results found for any visits on 05/14/22.  Assessment & Plan    Routine Health Maintenance and Physical Exam  Exercise Activities and Dietary recommendations  Goals      DIET - EAT MORE FRUITS AND VEGETABLES     Manage My Emotions  Timeframe:  Long-Range Goal Priority:  High Start Date:     05/06/20                        Expected End Date: 05/22/20                      Follow Up Date No further follow up, case closure performed due to inability to [facility] contact with patient.   - consider beginning personal counseling - talk about feelings with a friend, family or spiritual advisor - practice positive thinking and self-talk  -consider AA and obtaining a sponsor   Why is this important?   When you are stressed, down or upset, your body reacts too.  For example, your blood pressure may get higher; you may have a headache or stomachache.  When your emotions get the best of you, your body's ability to fight off cold and flu gets weak.  These steps will help you manage your emotions.     Notes:      Prevent falls     Recommend to remove any items from the home that may cause slips or trips.     Quit Smoking     Recommend to continue efforts to reduce smoking habits until no longer smoking.      Reduce alcohol intake     Recommend to cut back to no more than 1 serving of alcohol a day.        Immunization History  Administered Date(s) Administered   Fluad Quad(high Dose 65+) 01/25/2019, 01/31/2020, 11/19/2020, 11/12/2021   Influenza, High Dose Seasonal PF 11/02/2017   Moderna Sars-Covid-2 Vaccination 02/14/2019, 03/14/2019, 12/04/2019   Pneumococcal Conjugate-13 03/25/2017   Pneumococcal Polysaccharide-23 01/25/2019    Health Maintenance  Topic Date Due   Zoster Vaccines- Shingrix (1 of 2) Never done   COVID-19 Vaccine (4 - 2023-24 season) 09/26/2021   DEXA SCAN  03/11/2022    Medicare Annual Wellness (AWV)  07/09/2022   INFLUENZA VACCINE  08/27/2022   Lung Cancer Screening  04/29/2023   MAMMOGRAM  11/18/2023   COLONOSCOPY (Pts 45-25yrs Insurance coverage will need to be confirmed)  03/04/2030   Pneumonia Vaccine 29+ Years old  Completed   Hepatitis C Screening  Completed   HPV VACCINES  Aged Out   DTaP/Tdap/Td  Discontinued    Discussed health benefits of physical activity, and encouraged her to engage in regular exercise appropriate for her age and condition.  Problem List Items Addressed This Visit       Other   Annual physical exam - Primary    UTD on dental and vision UTD on colon, DEXA, mammo Things to do to keep yourself healthy  - Exercise at least 30-45 minutes a day, 3-4 days a week.  - Eat a low-fat diet with lots of fruits and vegetables, up to 7-9 servings per day.  - Seatbelts can save your life. Wear them always.  - Smoke detectors on every level of your home, check batteries every year.  - Eye Doctor - have an eye exam every 1-2 years  - Safe sex - if you may be exposed to STDs, use a condom.  - Alcohol -  If you drink, do it moderately, less than 2 drinks per day.  - Health Care Power of Attorney. Choose someone to speak for you if you are not able.  - Depression is common in our stressful world.If you're feeling down or losing  interest in things you normally enjoy, please come in for a visit.  - Violence - If anyone is threatening or hurting you, please call immediately. Continues to smoke; denies further assistance Request for refill for chronic medication; no concerns with s/s or use       Relevant Orders   CBC with Differential/Platelet   Lipid panel   Depression, recurrent    Chronic, recurrent, currently moderate Continues to decline use of SSRI or similar to assist Continue to monitor      Tinnitus of right ear    Chronic, stable Request for refills       Relevant Medications   triamterene-hydrochlorothiazide  (MAXZIDE-25) 37.5-25 MG tablet   Return in about 6 months (around 11/13/2022) for chonic disease management.    Leilani Merl, FNP, have reviewed all documentation for this visit. The documentation on 05/14/22 for the exam, diagnosis, procedures, and orders are all accurate and complete.  Jacky Kindle, FNP  [facility] [phone] (phone) [phone] (fax)  [facility]

## 2022-05-14 ENCOUNTER — Encounter: Payer: Self-pay | Admitting: Family Medicine

## 2022-05-14 ENCOUNTER — Ambulatory Visit (INDEPENDENT_AMBULATORY_CARE_PROVIDER_SITE_OTHER): Payer: 59 | Admitting: Family Medicine

## 2022-05-14 VITALS — BP 125/74 | HR 73 | Temp 98.3°F | Resp 12 | Ht 68.0 in | Wt 144.7 lb

## 2022-05-14 DIAGNOSIS — H9311 Tinnitus, right ear: Secondary | ICD-10-CM

## 2022-05-14 DIAGNOSIS — F339 Major depressive disorder, recurrent, unspecified: Secondary | ICD-10-CM | POA: Diagnosis not present

## 2022-05-14 DIAGNOSIS — Z0000 Encounter for general adult medical examination without abnormal findings: Secondary | ICD-10-CM | POA: Diagnosis not present

## 2022-05-14 DIAGNOSIS — R718 Other abnormality of red blood cells: Secondary | ICD-10-CM | POA: Diagnosis not present

## 2022-05-14 MED ORDER — TRIAMTERENE-HCTZ 37.5-25 MG PO TABS: 1.0000 | ORAL_TABLET | Freq: Every day | ORAL | 3 refills | Status: DC

## 2022-05-14 NOTE — Assessment & Plan Note (Signed)
Chronic, recurrent, currently moderate Continues to decline use of SSRI or similar to assist Continue to monitor

## 2022-05-14 NOTE — Patient Instructions (Signed)
The CDC recommends two doses of Shingrix (the new shingles vaccine) separated by 2 to 6 months for adults age 70 years and older. I recommend checking with your insurance plan regarding coverage for this vaccine.    

## 2022-05-14 NOTE — Assessment & Plan Note (Signed)
UTD on dental and vision UTD on colon, DEXA, mammo Things to do to keep yourself healthy  - Exercise at least 30-45 minutes a day, 3-4 days a week.  - Eat a low-fat diet with lots of fruits and vegetables, up to 7-9 servings per day.  - Seatbelts can save your life. Wear them always.  - Smoke detectors on every level of your home, check batteries every year.  - Eye Doctor - have an eye exam every 1-2 years  - Safe sex - if you may be exposed to STDs, use a condom.  - Alcohol -  If you drink, do it moderately, less than 2 drinks per day.  - Health Care Power of Attorney. Choose someone to speak for you if you are not able.  - Depression is common in our stressful world.If you're feeling down or losing interest in things you normally enjoy, please come in for a visit.  - Violence - If anyone is threatening or hurting you, please call immediately. Continues to smoke; denies further assistance Request for refill for chronic medication; no concerns with s/s or use

## 2022-05-14 NOTE — Assessment & Plan Note (Signed)
Chronic, stable Request for refills  

## 2022-05-15 LAB — CBC WITH DIFFERENTIAL/PLATELET
Basophils Absolute: 0.1 10*3/uL (ref 0.0–0.2)
Basos: 2 %
EOS (ABSOLUTE): 0.1 10*3/uL (ref 0.0–0.4)
Eos: 2 %
Hematocrit: 41.7 % (ref 34.0–46.6)
Hemoglobin: 13.7 g/dL (ref 11.1–15.9)
Immature Grans (Abs): 0 10*3/uL (ref 0.0–0.1)
Immature Granulocytes: 0 %
Lymphocytes Absolute: 1.6 10*3/uL (ref 0.7–3.1)
Lymphs: 28 %
MCH: 30.3 pg (ref 26.6–33.0)
MCHC: 32.9 g/dL (ref 31.5–35.7)
MCV: 92 fL (ref 79–97)
Monocytes Absolute: 0.5 10*3/uL (ref 0.1–0.9)
Monocytes: 8 %
Neutrophils Absolute: 3.5 10*3/uL (ref 1.4–7.0)
Neutrophils: 60 %
Platelets: 181 10*3/uL (ref 150–450)
RBC: 4.52 x10E6/uL (ref 3.77–5.28)
RDW: 13.6 % (ref 11.7–15.4)
WBC: 5.9 10*3/uL (ref 3.4–10.8)

## 2022-05-15 LAB — LIPID PANEL
Chol/HDL Ratio: 2.2 ratio (ref 0.0–4.4)
Cholesterol, Total: 197 mg/dL (ref 100–199)
HDL: 88 mg/dL (ref >39)
LDL Chol Calc (NIH): 86 mg/dL (ref 0–99)
Triglycerides: 135 mg/dL (ref 0–149)
VLDL Cholesterol Cal: 23 mg/dL (ref 5–40)

## 2022-05-17 NOTE — Progress Notes (Signed)
Labs remain well controlled. Estimated stroke and heart attack risk remains elevated. Continue to recommend smoking cessation if able. Could increase the lipitor from 10 mg to 40 or 80 mg to further assist LDL reduction.   The 10-year ASCVD risk score (Arnett DK, et al., 2019) is: 17.5%   Values used to calculate the score:     Age: 72 years     Sex: Female     Is Non-Hispanic African American: No     Diabetic: No     Tobacco smoker: Yes     Systolic Blood Pressure: 125 mmHg     Is BP treated: Yes     HDL Cholesterol: 88 mg/dL     Total Cholesterol: 197 mg/dL

## 2022-05-18 ENCOUNTER — Telehealth: Payer: Self-pay

## 2022-05-19 NOTE — Telephone Encounter (Signed)
Patient returned call and message reviewed with patient from Verna Czech, FNP from 05/19/22 on 05/19/22. Patient verbalized understanding

## 2022-06-08 ENCOUNTER — Ambulatory Visit: Payer: Self-pay | Admitting: *Deleted

## 2022-06-08 NOTE — Telephone Encounter (Signed)
Summary: Questioning a Sinus Infection   Headache, facial pain, yellow phlegm  since friday: Hx of sinus infections           Chief Complaint: sinus pain facial swelling Symptoms: headaches, facial swelling upper head, above cheeks around nose . Yellow mucus reported.  Frequency: 06/05/22 Pertinent Negatives: Patient denies fever no worsening sx at this time no difficulty breathing no chest pain reported  Disposition: [] ED /[] Urgent Care (no appt availability in office) / [] Appointment(In office/virtual)/ []  [place] [facility]/ [] Home Care/ [] Refused Recommended Disposition /[] [place] [facility]/ [x]  Follow-up with PCP Additional Notes:   Patient requesting my chart VV with PCP and none available today or tomorrow. Patient requesting not to come in to OV due to feeling "bad". Please advise if antibiotics can be given or if VV can be scheduled.      Reason for Disposition  [1] SEVERE pain AND [2] not improved 2 hours after pain medicine  Answer Assessment - Initial Assessment Questions 1. LOCATION: "Where does it hurt?"      Upper head above cheeks 2. ONSET: "When did the sinus pain start?"  (e.g., hours, days)      06/05/22 3. SEVERITY: "How bad is the pain?"   (Scale 1-10; mild, moderate or severe)   - MILD (1-3): doesn't interfere with normal activities    - MODERATE (4-7): interferes with normal activities (e.g., work or school) or awakens from sleep   - SEVERE (8-10): excruciating pain and patient unable to do any normal activities        Facial pain upper head and above cheeks  4. RECURRENT SYMPTOM: "Have you ever had sinus problems before?" If Yes, ask: "When was the last time?" and "What happened that time?"      Yes  5. NASAL CONGESTION: "Is the nose blocked?" If Yes, ask: "Can you open it or must you breathe through your mouth?"     Na  6. NASAL DISCHARGE: "Do you have discharge from your nose?" If so ask, "What color?"     Yes  7. FEVER: "Do you have a fever?"  If Yes, ask: "What is it, how was it measured, and when did it start?"      No  8. OTHER SYMPTOMS: "Do you have any other symptoms?" (e.g., sore throat, cough, earache, difficulty breathing)     Headache, facial swelling above cheeks around nose  yellow mucus noted  9. PREGNANCY: "Is there any chance you are pregnant?" "When was your last menstrual period?"     a  Protocols used: Sinus Pain or Congestion-A-AH

## 2022-06-09 NOTE — Telephone Encounter (Signed)
Appointment made for patient to be seen

## 2022-06-10 ENCOUNTER — Encounter: Payer: Self-pay | Admitting: Family Medicine

## 2022-06-10 ENCOUNTER — Ambulatory Visit (INDEPENDENT_AMBULATORY_CARE_PROVIDER_SITE_OTHER): Payer: 59 | Admitting: Family Medicine

## 2022-06-10 VITALS — BP 125/60 | HR 86 | Temp 98.6°F | Ht 69.5 in | Wt 142.0 lb

## 2022-06-10 DIAGNOSIS — J441 Chronic obstructive pulmonary disease with (acute) exacerbation: Secondary | ICD-10-CM | POA: Diagnosis not present

## 2022-06-10 DIAGNOSIS — J301 Allergic rhinitis due to pollen: Secondary | ICD-10-CM | POA: Insufficient documentation

## 2022-06-10 MED ORDER — ALBUTEROL SULFATE HFA 108 (90 BASE) MCG/ACT IN AERS: 2.0000 | INHALATION_SPRAY | Freq: Four times a day (QID) | RESPIRATORY_TRACT | 2 refills | Status: DC | PRN

## 2022-06-10 MED ORDER — PREDNISONE 50 MG PO TABS: 50.0000 mg | ORAL_TABLET | Freq: Every day | ORAL | 0 refills | Status: DC

## 2022-06-10 MED ORDER — FLUTICASONE PROPIONATE 50 MCG/ACT NA SUSP: 2.0000 | Freq: Every day | NASAL | 6 refills | Status: DC

## 2022-06-10 MED ORDER — LEVOFLOXACIN 750 MG PO TABS: 750.0000 mg | ORAL_TABLET | Freq: Every day | ORAL | 0 refills | Status: DC

## 2022-06-10 NOTE — Progress Notes (Signed)
Established patient visit  Patient: Jennifer [name]   DOB: December 15, 1950   72 y.o. Female  MRN: [id number] Visit Date: 06/10/2022  Today's healthcare provider: Jacky Kindle, FNP  Re Introduced to nurse practitioner role and practice setting.  All questions answered.  Discussed provider/patient relationship and expectations.  Chief Complaint  Patient presents with   Sinus Problem    Sinus pressure, headache, congestion--6 days   Subjective     HPI     Sinus Problem    Additional comments: Sinus pressure, headache, congestion--6 days      Last edited by Shelly Bombard, CMA on 06/10/2022  2:28 PM.      Medications: Outpatient Medications Prior to Visit  Medication Sig   alendronate (FOSAMAX) 70 MG tablet TAKE 1 TABLET EVERY WEEK  ON  AN  EMPTY  STOMACH WITH A FULL GLASS OF WATER   atorvastatin (LIPITOR) 10 MG tablet Take 1 tablet (10 mg total) by mouth daily.   b complex vitamins capsule Take 1 capsule by mouth daily.   Calcium Citrate-Vitamin D 315-5 MG-MCG TABS    Fluticasone-Umeclidin-Vilant (TRELEGY ELLIPTA) 100-62.5-25 MCG/ACT AEPB Inhale 1 puff into the lungs daily.   triamterene-hydrochlorothiazide (MAXZIDE-25) 37.5-25 MG tablet Take 1 tablet by mouth daily.   No facility-administered medications prior to visit.   Review of Systems     Objective    BP 125/60 (BP Location: Left Arm, Patient Position: Sitting, Cuff Size: Normal)   Pulse 86   Temp 98.6 F (37 C)   Ht 5' 9.5" (1.765 m)   Wt 142 lb (64.4 kg)   SpO2 97%   BMI 20.67 kg/m    Physical Exam Vitals and nursing note reviewed.  Constitutional:      General: She is not in acute distress.    Appearance: Normal appearance. She is normal weight. She is ill-appearing. She is not toxic-appearing or diaphoretic.  HENT:     Head: Normocephalic and atraumatic.     Right Ear: Tympanic membrane, ear canal and external ear normal.     Left Ear: Tympanic membrane, ear canal and external ear normal.  Cardiovascular:      Rate and Rhythm: Normal rate and regular rhythm.     Pulses: Normal pulses.     Heart sounds: Normal heart sounds. No murmur heard.    No friction rub. No gallop.  Pulmonary:     Effort: Pulmonary effort is normal. No respiratory distress.     Breath sounds: Decreased air movement present. No stridor. Examination of the right-upper field reveals decreased breath sounds and wheezing. Examination of the left-upper field reveals decreased breath sounds and wheezing. Examination of the right-middle field reveals decreased breath sounds. Examination of the left-middle field reveals decreased breath sounds. Examination of the right-lower field reveals decreased breath sounds. Examination of the left-lower field reveals decreased breath sounds. Decreased breath sounds and wheezing present. No rhonchi or rales.  Chest:     Chest wall: No tenderness.  Abdominal:     General: Bowel sounds are normal.     Palpations: Abdomen is soft.  Musculoskeletal:        General: No swelling, tenderness, deformity or signs of injury. Normal range of motion.     Right lower leg: No edema.     Left lower leg: No edema.  Skin:    General: Skin is warm and dry.     Capillary Refill: Capillary refill takes less than 2 seconds.     Coloration: Skin is  not jaundiced or pale.     Findings: No bruising, erythema, lesion or rash.  Neurological:     General: No focal deficit present.     Mental Status: She is alert and oriented to person, place, and time. Mental status is at baseline.     Cranial Nerves: No cranial nerve deficit.     Sensory: No sensory deficit.     Motor: No weakness.     Coordination: Coordination normal.  Psychiatric:        Mood and Affect: Mood normal.        Behavior: Behavior normal.        Thought Content: Thought content normal.        Judgment: Judgment normal.     No results found for any visits on 06/10/22.  Assessment & Plan     Problem List Items Addressed This Visit        Respiratory   COPD exacerbation (HCC) - Primary    Acute x 6 days with productive cough and overall fatigue and sinus pressure, congestion, wheezing/poor air movement Denies sick contacts or travel Continues to use tobacco at previous levels; continues to remain pre contemplative regarding cessation        Relevant Medications   levofloxacin (LEVAQUIN) 750 MG tablet   predniSONE (DELTASONE) 50 MG tablet   albuterol (VENTOLIN HFA) 108 (90 Base) MCG/ACT inhaler   fluticasone (FLONASE) 50 MCG/ACT nasal spray   Seasonal allergic rhinitis due to pollen    Recommend start of flonase to assist with ongoing sinus irritation; discussed how it is a non habit forming medication and it will assist with fluid concerns in sinus cavities and may assist with tinnitus of R ear as well       Relevant Medications   fluticasone (FLONASE) 50 MCG/ACT nasal spray   Return if symptoms worsen or fail to improve.     Leilani Merl, FNP, have reviewed all documentation for this visit. The documentation on 06/10/22 for the exam, diagnosis, procedures, and orders are all accurate and complete.  Jacky Kindle, FNP  [facility] [phone] (phone) [phone] (fax)  [facility]

## 2022-06-10 NOTE — Assessment & Plan Note (Signed)
Acute x 6 days with productive cough and overall fatigue and sinus pressure, congestion, wheezing/poor air movement Denies sick contacts or travel Continues to use tobacco at previous levels; continues to remain pre contemplative regarding cessation

## 2022-06-10 NOTE — Assessment & Plan Note (Signed)
Recommend start of flonase to assist with ongoing sinus irritation; discussed how it is a non habit forming medication and it will assist with fluid concerns in sinus cavities and may assist with tinnitus of R ear as well

## 2022-07-06 DIAGNOSIS — H2513 Age-related nuclear cataract, bilateral: Secondary | ICD-10-CM | POA: Diagnosis not present

## 2022-07-10 ENCOUNTER — Other Ambulatory Visit: Payer: Self-pay | Admitting: Family Medicine

## 2022-07-15 ENCOUNTER — Ambulatory Visit (INDEPENDENT_AMBULATORY_CARE_PROVIDER_SITE_OTHER): Payer: 59

## 2022-07-15 VITALS — Ht 68.0 in | Wt 142.0 lb

## 2022-07-15 DIAGNOSIS — Z0000 Encounter for general adult medical examination without abnormal findings: Secondary | ICD-10-CM | POA: Diagnosis not present

## 2022-07-15 NOTE — Progress Notes (Signed)
Subjective:   Jennifer [name] is a 72 y.o. female who presents for Medicare Annual (Subsequent) preventive examination.  Visit Complete: Virtual  I connected with  Jennifer [name] on 07/15/22 by a audio enabled telemedicine application and verified that I am speaking with the correct person using two identifiers.  Patient Location: Home  Provider Location: Office/Clinic  I discussed the limitations of evaluation and management by telemedicine. The patient expressed understanding and agreed to proceed.  Patient Medicare AWV questionnaire was completed by the patient on 07/11/2022; I have confirmed that all information answered by patient is correct and no changes since this date.  Review of Systems    Cardiac Risk Factors include: advanced age (>75men, >23 women);dyslipidemia    Objective:    Today's Vitals   07/15/22 1343  Weight: 142 lb (64.4 kg)  Height: 5\' 8"  (1.727 m)   Body mass index is 21.59 kg/m.     07/15/2022    1:54 PM 07/08/2021   11:55 AM 03/04/2020    9:58 AM 09/14/2019    3:54 PM 08/24/2018    8:32 PM 07/18/2018   10:40 PM 07/01/2018    5:26 PM  Advanced Directives  Does Patient Have a Medical Advance Directive? No No No No No No No  Would patient like information on creating a medical advance directive?  No - Patient declined  No - Patient declined No - Patient declined      Current Medications (verified) Outpatient Encounter Medications as of 07/15/2022  Medication Sig   albuterol (VENTOLIN HFA) 108 (90 Base) MCG/ACT inhaler Inhale 2 puffs into the lungs every 6 (six) hours as needed for wheezing or shortness of breath.   alendronate (FOSAMAX) 70 MG tablet TAKE 1 TABLET EVERY WEEK  ON  AN  EMPTY  STOMACH WITH A FULL GLASS OF WATER   atorvastatin (LIPITOR) 10 MG tablet Take 1 tablet (10 mg total) by mouth daily.   b complex vitamins capsule Take 1 capsule by mouth daily.   Calcium Citrate-Vitamin D 315-5 MG-MCG TABS    fluticasone (FLONASE) 50 MCG/ACT nasal spray  Place 2 sprays into both nostrils daily.   Fluticasone-Umeclidin-Vilant (TRELEGY ELLIPTA) 100-62.5-25 MCG/ACT AEPB Inhale 1 puff into the lungs daily.   levofloxacin (LEVAQUIN) 750 MG tablet Take 1 tablet (750 mg total) by mouth daily. (Patient not taking: Reported on 07/15/2022)   predniSONE (DELTASONE) 50 MG tablet Take 1 tablet (50 mg total) by mouth daily with breakfast. (Patient not taking: Reported on 07/15/2022)   triamterene-hydrochlorothiazide (MAXZIDE-25) 37.5-25 MG tablet Take 1 tablet by mouth daily.   No facility-administered encounter medications on file as of 07/15/2022.    Allergies (verified) Patient has no known allergies.   History: Past Medical History:  Diagnosis Date   Arthritis    mild   Cancer (HCC)    skin cancer leg and chest - Dr. Jarold Motto, and [facility]   COPD (chronic obstructive pulmonary disease) [facility])    Dysrhythmia    palpitation   Emphysema of lung (HCC)    Family history of colon cancer    Headache    as  a child    Heart murmur    while pregnant   Hyperlipidemia 07/17/2017   Osteoporosis    Sleep apnea    Tobacco abuse    Past Surgical History:  Procedure Laterality Date   ABDOMINAL HYSTERECTOMY     1996   COLONOSCOPY WITH PROPOFOL N/A 03/29/2017   Procedure: COLONOSCOPY WITH PROPOFOL;  Surgeon:  Toney Reil, MD;  Location: [facility] ENDOSCOPY;  Service: Gastroenterology;  Laterality: N/A;   COLONOSCOPY WITH PROPOFOL N/A 03/04/2020   Procedure: COLONOSCOPY WITH PROPOFOL;  Surgeon: Toney Reil, MD;  Location: [facility] ENDOSCOPY;  Service: Gastroenterology;  Laterality: N/A;   HEMICOLECTOMY     righ  [id number]   LAPAROSCOPIC RIGHT HEMI COLECTOMY Right 07/16/2017   Procedure: LAPAROSCOPIC RIGHT HEMI COLECTOMY;  Surgeon: Andria Meuse, MD;  Location: [facility] ORS;  Service: General;  Laterality: Right;   TUBAL LIGATION     Family History  Problem Relation Age of Onset   Diabetes Mother 72       12/2020   Kidney disease  Mother    Arthritis Mother    COPD Mother    Heart attack Father    Colon cancer Paternal Aunt 41   Colon cancer Paternal Uncle        dx >50, colon completely removed   Diabetes Maternal Grandmother    Colon cancer Maternal Grandfather    Heart disease Paternal Grandmother    Breast cancer Neg Hx    Social History   Socioeconomic History   Marital status: Divorced    Spouse name: Not on file   Number of children: 1   Years of education: Not on file   Highest education level: GED or equivalent  Occupational History   Occupation: retired  Tobacco Use   Smoking status: Every Day    Packs/day: 1.00    Years: 15.00    Additional pack years: 0.00    Total pack years: 15.00    Types: Cigarettes, Cigars   Smokeless tobacco: Never  Vaping Use   Vaping Use: Never used  Substance and Sexual Activity   Alcohol use: Not Currently   Drug use: No   Sexual activity: Not Currently  Other Topics Concern   Not on file  Social History Narrative   Not on file   Social Determinants of Health   Financial Resource Strain: Low Risk  (07/11/2022)   Overall Financial Resource Strain (CARDIA)    Difficulty of Paying Living Expenses: Not hard at all  Food Insecurity: No Food Insecurity (07/11/2022)   Hunger Vital Sign    Worried About Running Out of Food in the Last Year: Never true    Ran Out of Food in the Last Year: Never true  Transportation Needs: No Transportation Needs (07/11/2022)   PRAPARE - Administrator, Civil Service (Medical): No    Lack of Transportation (Non-Medical): No  Physical Activity: Insufficiently Active (07/11/2022)   Exercise Vital Sign    Days of Exercise per Week: 3 days    Minutes of Exercise per Session: 30 min  Stress: Stress Concern Present (07/11/2022)   [employer] of Occupational Health - Occupational Stress Questionnaire    Feeling of Stress : To some extent  Social Connections: Unknown (07/11/2022)   Social Connection and Isolation  Panel [NHANES]    Frequency of Communication with Friends and Family: More than three times a week    Frequency of Social Gatherings with Friends and Family: More than three times a week    Attends Religious Services: Patient declined    Database administrator or Organizations: No    Attends Banker Meetings: Never    Marital Status: Divorced    Tobacco Counseling Ready to quit: Yes Counseling given: Not Answered   Clinical Intake:  Pre-visit preparation completed: Yes  Pain : No/denies pain  BMI - recorded: 21.59 Nutritional Status: BMI of 19-24  Normal Nutritional Risks: None Diabetes: No  How often do you need to have someone help you when you read instructions, pamphlets, or other written materials from your doctor or pharmacy?: 1 - Never  Interpreter Needed?: No  Comments: lives alone Information entered by :: B.Arnol Mcgibbon,LPN   Activities of Daily Living    07/11/2022    9:35 AM 06/10/2022    2:33 PM  In your present state of health, do you have any difficulty performing the following activities:  Hearing? 0 0  Vision? 0 0  Difficulty concentrating or making decisions? 0 0  Walking or climbing stairs? 0 0  Dressing or bathing? 0 0  Doing errands, shopping? 0 0  Preparing Food and eating ? N   Using the Toilet? N   In the past six months, have you accidently leaked urine? Y   Do you have problems with loss of bowel control? Y   Managing your Medications? N   Managing your Finances? N   Housekeeping or managing your Housekeeping? N     Patient Care Team: Jacky Kindle, FNP as PCP - General (Family Medicine) Sheets, Ivin Booty, LCSW as Counselor (Licensed Clinical Social Worker) Dingeldein, Viviann Spare, MD (Ophthalmology) Hurley Cisco, [place] M, [place] as Social Worker  Indicate any recent Medical Services you may have received from other than [facility] providers in the past year (date may be approximate).     Assessment:   This is a routine wellness  examination for Jennifer [name].  Hearing/Vision screen Hearing Screening - Comments:: Adequate hearing Vision Screening - Comments:: Adequate vision w/glasses Dr Dellie Burns  Dietary issues and exercise activities discussed:     Goals Addressed             This Visit's Progress    Quit Smoking   Not on track    Recommend to continue efforts to reduce smoking habits until no longer smoking.        Depression Screen    07/15/2022    1:51 PM 06/10/2022    2:33 PM 05/14/2022    1:45 PM 01/06/2022   11:11 AM 11/12/2021    1:11 PM 07/08/2021   11:56 AM 05/12/2021    1:14 PM  PHQ 2/9 Scores  PHQ - 2 Score 0 0 1 1 0 0 0  PHQ- 9 Score  5 4 5 6  0 0    Fall Risk    07/11/2022    9:35 AM 06/10/2022    2:33 PM 05/14/2022    1:45 PM 01/06/2022   11:11 AM 11/12/2021    1:11 PM  Fall Risk   Falls in the past year? 0 0 0 0 0  Number falls in past yr: 0 0 0 0 0  Injury with Fall? 0 0 0 0 0  Risk for fall due to : No Fall Risks  No Fall Risks No Fall Risks No Fall Risks  Follow up Falls prevention discussed;Education provided  Falls evaluation completed Falls evaluation completed Falls evaluation completed    MEDICARE RISK AT HOME:  Medicare Risk at Home - 07/15/22 1346     Any stairs in or around the home? Yes    If so, are there any without handrails? Yes    Home free of loose throw rugs in walkways, pet beds, electrical cords, etc? Yes    Adequate lighting in your home to reduce risk of falls? Yes    Life alert? No  Use of a cane, walker or w/c? No    Grab bars in the bathroom? Yes    Shower chair or bench in shower? Yes    Elevated toilet seat or a handicapped toilet? No             TIMED UP AND GO:  Was the test performed?  No    Cognitive Function:        07/15/2022    1:56 PM 07/08/2021   11:59 AM  6CIT Screen  What Year? 0 points 0 points  What month? 0 points 0 points  What time? 0 points 0 points  Count back from 20 0 points 0 points  Months in reverse 0  points 0 points  Repeat phrase 0 points 0 points  Total Score 0 points 0 points    Immunizations Immunization History  Administered Date(s) Administered   Fluad Quad(high Dose 65+) 01/25/2019, 01/31/2020, 11/19/2020, 11/12/2021   Influenza, High Dose Seasonal PF 11/02/2017   Moderna Sars-Covid-2 Vaccination 02/14/2019, 03/14/2019, 12/04/2019   Pneumococcal Conjugate-13 03/25/2017   Pneumococcal Polysaccharide-23 01/25/2019    TDAP status: Due, Education has been provided regarding the importance of this vaccine. Advised may receive this vaccine at local pharmacy or Health Dept. Aware to provide a copy of the vaccination record if obtained from local pharmacy or Health Dept. Verbalized acceptance and understanding.  Flu Vaccine status: Up to date  Pneumococcal vaccine status: Up to date  Covid-19 vaccine status: Completed vaccines  Qualifies for Shingles Vaccine? Yes   Zostavax completed No   Shingrix Completed?: No.    Education has been provided regarding the importance of this vaccine. Patient has been advised to call insurance company to determine out of pocket expense if they have not yet received this vaccine. Advised may also receive vaccine at local pharmacy or Health Dept. Verbalized acceptance and understanding.  Screening Tests Health Maintenance  Topic Date Due   Zoster Vaccines- Shingrix (1 of 2) Never done   COVID-19 Vaccine (4 - 2023-24 season) 09/26/2021   DEXA SCAN  03/11/2022   INFLUENZA VACCINE  08/27/2022   Lung Cancer Screening  04/29/2023   Medicare Annual Wellness (AWV)  07/15/2023   MAMMOGRAM  11/18/2023   Colonoscopy  03/04/2030   Pneumonia Vaccine 31+ Years old  Completed   Hepatitis C Screening  Completed   HPV VACCINES  Aged Out   DTaP/Tdap/Td  Discontinued    Health Maintenance  Health Maintenance Due  Topic Date Due   Zoster Vaccines- Shingrix (1 of 2) Never done   COVID-19 Vaccine (4 - 2023-24 season) 09/26/2021   DEXA SCAN  03/11/2022     Colorectal cancer screening: Type of screening: Colonoscopy. Completed yes. Repeat every 10 years  Mammogram status: Completed yes. Repeat every year  Bone Density status: Completed yes. Results reflect: Bone density results: OSTEOPOROSIS. Repeat every 5 years.  Lung Cancer Screening: (Low Dose CT Chest recommended if Age 65-80 years, 20 pack-year currently smoking OR have quit w/in 15years.) does not qualify.   Lung Cancer Screening Referral: no  Additional Screening:  Hepatitis C Screening: does not qualify; Completed yes  Vision Screening: Recommended annual ophthalmology exams for early detection of glaucoma and other disorders of the eye. Is the patient up to date with their annual eye exam?  Yes  Who is the provider or what is the name of the office in which the patient attends annual eye exams? Dr Dellie Burns If pt is not established with a provider, would they  like to be referred to a provider to establish care? No .   Dental Screening: Recommended annual dental exams for proper oral hygiene  Diabetic Foot Exam:   Community Resource Referral / Chronic Care Management: CRR required this visit?  No   CCM required this visit?  No     Plan:     I have personally reviewed and noted the following in the patient's chart:   Medical and social history Use of alcohol, tobacco or illicit drugs  Current medications and supplements including opioid prescriptions. Patient is not currently taking opioid prescriptions. Functional ability and status Nutritional status Physical activity Advanced directives List of other physicians Hospitalizations, surgeries, and ER visits in previous 12 months Vitals Screenings to include cognitive, depression, and falls Referrals and appointments  In addition, I have reviewed and discussed with patient certain preventive protocols, quality metrics, and best practice recommendations. A written personalized care plan for preventive services  as well as general preventive health recommendations were provided to patient.     Sue Lush, LPN   [id number]/[id number]   After Visit Summary: (MyChart) Due to this being a telephonic visit, the after visit summary with patients personalized plan was offered to patient via MyChart   Nurse Notes: The patient states she is doing well and has no concerns or questions at this time.

## 2022-07-15 NOTE — Patient Instructions (Signed)
[name] , Thank you for taking time to come for your Medicare Wellness Visit. I appreciate your ongoing commitment to your health goals. Please review the following plan we discussed and let me know if I can assist you in the future.   These are the goals we discussed:  Goals      DIET - EAT MORE FRUITS AND VEGETABLES     Manage My Emotions     Timeframe:  Long-Range Goal Priority:  High Start Date:     05/06/20                        Expected End Date: 05/22/20                      Follow Up Date No further follow up, case closure performed due to inability to [facility] contact with patient.   - consider beginning personal counseling - talk about feelings with a friend, family or spiritual advisor - practice positive thinking and self-talk  -consider AA and obtaining a sponsor   Why is this important?   When you are stressed, down or upset, your body reacts too.  For example, your blood pressure may get higher; you may have a headache or stomachache.  When your emotions get the best of you, your body's ability to fight off cold and flu gets weak.  These steps will help you manage your emotions.     Notes:      Prevent falls     Recommend to remove any items from the home that may cause slips or trips.     Quit Smoking     Recommend to continue efforts to reduce smoking habits until no longer smoking.      Reduce alcohol intake     Recommend to cut back to no more than 1 serving of alcohol a day.        This is a list of the screening recommended for you and due dates:  Health Maintenance  Topic Date Due   Zoster (Shingles) Vaccine (1 of 2) Never done   COVID-19 Vaccine (4 - 2023-24 season) 09/26/2021   DEXA scan (bone density measurement)  03/11/2022   Flu Shot  08/27/2022   Screening for Lung Cancer  04/29/2023   Medicare Annual Wellness Visit  07/15/2023   Mammogram  11/18/2023   Colon Cancer Screening  03/04/2030   Pneumonia Vaccine  Completed   Hepatitis C  Screening  Completed   HPV Vaccine  Aged Out   DTaP/Tdap/Td vaccine  Discontinued    Advanced directives: no  Conditions/risks identified: low falls risk  Next appointment: Follow up in one year for your annual wellness visit 07/20/2023 @ 1:30pm telephone   Preventive Care 65 Years and Older, Female Preventive care refers to lifestyle choices and visits with your health care provider that can promote health and wellness. What does preventive care include? A yearly physical exam. This is also called an annual well check. Dental exams once or twice a year. Routine eye exams. Ask your health care provider how often you should have your eyes checked. Personal lifestyle choices, including: Daily care of your teeth and gums. Regular physical activity. Eating a healthy diet. Avoiding tobacco and drug use. Limiting alcohol use. Practicing safe sex. Taking low-dose aspirin every day. Taking vitamin and mineral supplements as recommended by your health care provider. What happens during an annual well check? The services and screenings done  by your health care provider during your annual well check will depend on your age, overall health, lifestyle risk factors, and family history of disease. Counseling  Your health care provider may ask you questions about your: Alcohol use. Tobacco use. Drug use. Emotional well-being. Home and relationship well-being. Sexual activity. Eating habits. History of falls. Memory and ability to understand (cognition). Work and work Astronomer. Reproductive health. Screening  You may have the following tests or measurements: Height, weight, and BMI. Blood pressure. Lipid and cholesterol levels. These may be checked every 5 years, or more frequently if you are over 20 years old. Skin check. Lung cancer screening. You may have this screening every year starting at age 23 if you have a 30-pack-year history of smoking and currently smoke or have quit  within the past 15 years. Fecal occult blood test (FOBT) of the stool. You may have this test every year starting at age 74. Flexible sigmoidoscopy or colonoscopy. You may have a sigmoidoscopy every 5 years or a colonoscopy every 10 years starting at age 45. Hepatitis C blood test. Hepatitis B blood test. Sexually transmitted disease (STD) testing. Diabetes screening. This is done by checking your blood sugar (glucose) after you have not eaten for a while (fasting). You may have this done every 1-3 years. Bone density scan. This is done to screen for osteoporosis. You may have this done starting at age 61. Mammogram. This may be done every 1-2 years. Talk to your health care provider about how often you should have regular mammograms. Talk with your health care provider about your test results, treatment options, and if necessary, the need for more tests. Vaccines  Your health care provider may recommend certain vaccines, such as: Influenza vaccine. This is recommended every year. Tetanus, diphtheria, and acellular pertussis (Tdap, Td) vaccine. You may need a Td booster every 10 years. Zoster vaccine. You may need this after age 85. Pneumococcal 13-valent conjugate (PCV13) vaccine. One dose is recommended after age 95. Pneumococcal polysaccharide (PPSV23) vaccine. One dose is recommended after age 32. Talk to your health care provider about which screenings and vaccines you need and how often you need them. This information is not intended to replace advice given to you by your health care provider. Make sure you discuss any questions you have with your health care provider. Document Released: 02/08/2015 Document Revised: 10/02/2015 Document Reviewed: 11/13/2014 Elsevier Interactive Patient Education  2017 [employer].  Fall Prevention in the Home Falls can cause injuries. They can happen to people of all ages. There are many things you can do to make your home safe and to help prevent  falls. What can I do on the outside of my home? Regularly fix the edges of walkways and driveways and fix any cracks. Remove anything that might make you trip as you walk through a door, such as a raised step or threshold. Trim any bushes or trees on the path to your home. Use bright outdoor lighting. Clear any walking paths of anything that might make someone trip, such as rocks or tools. Regularly check to see if handrails are loose or broken. Make sure that both sides of any steps have handrails. Any raised decks and porches should have guardrails on the edges. Have any leaves, snow, or ice cleared regularly. Use sand or salt on walking paths during winter. Clean up any spills in your garage right away. This includes oil or grease spills. What can I do in the bathroom? Use night lights. Install  grab bars by the toilet and in the tub and shower. Do not use towel bars as grab bars. Use non-skid mats or decals in the tub or shower. If you need to sit down in the shower, use a plastic, non-slip stool. Keep the floor dry. Clean up any water that spills on the floor as soon as it happens. Remove soap buildup in the tub or shower regularly. Attach bath mats securely with double-sided non-slip rug tape. Do not have throw rugs and other things on the floor that can make you trip. What can I do in the bedroom? Use night lights. Make sure that you have a light by your bed that is easy to reach. Do not use any sheets or blankets that are too big for your bed. They should not hang down onto the floor. Have a firm chair that has side arms. You can use this for support while you get dressed. Do not have throw rugs and other things on the floor that can make you trip. What can I do in the kitchen? Clean up any spills right away. Avoid walking on wet floors. Keep items that you use a lot in easy-to-reach places. If you need to reach something above you, use a strong step stool that has a grab  bar. Keep electrical cords out of the way. Do not use floor polish or wax that makes floors slippery. If you must use wax, use non-skid floor wax. Do not have throw rugs and other things on the floor that can make you trip. What can I do with my stairs? Do not leave any items on the stairs. Make sure that there are handrails on both sides of the stairs and use them. Fix handrails that are broken or loose. Make sure that handrails are as long as the stairways. Check any carpeting to make sure that it is firmly attached to the stairs. Fix any carpet that is loose or worn. Avoid having throw rugs at the top or bottom of the stairs. If you do have throw rugs, attach them to the floor with carpet tape. Make sure that you have a light switch at the top of the stairs and the bottom of the stairs. If you do not have them, ask someone to add them for you. What else can I do to help prevent falls? Wear shoes that: Do not have high heels. Have rubber bottoms. Are comfortable and fit you well. Are closed at the toe. Do not wear sandals. If you use a stepladder: Make sure that it is fully opened. Do not climb a closed stepladder. Make sure that both sides of the stepladder are locked into place. Ask someone to hold it for you, if possible. Clearly mark and make sure that you can see: Any grab bars or handrails. First and last steps. Where the edge of each step is. Use tools that help you move around (mobility aids) if they are needed. These include: Canes. Walkers. Scooters. Crutches. Turn on the lights when you go into a dark area. Replace any light bulbs as soon as they burn out. Set up your furniture so you have a clear path. Avoid moving your furniture around. If any of your floors are uneven, fix them. If there are any pets around you, be aware of where they are. Review your medicines with your doctor. Some medicines can make you feel dizzy. This can increase your chance of falling. Ask  your doctor what other things that you can  do to help prevent falls. This information is not intended to replace advice given to you by your health care provider. Make sure you discuss any questions you have with your health care provider. Document Released: 11/08/2008 Document Revised: 06/20/2015 Document Reviewed: 02/16/2014 Elsevier Interactive Patient Education  2017 [employer].

## 2022-07-24 ENCOUNTER — Other Ambulatory Visit: Payer: Self-pay | Admitting: Family Medicine

## 2022-07-24 DIAGNOSIS — E7800 Pure hypercholesterolemia, unspecified: Secondary | ICD-10-CM

## 2022-08-14 IMAGING — CT CT CHEST LUNG CANCER SCREENING LOW DOSE W/O CM
2 of 5 series · 15 of 40 positions shown, 18 images · non-contrast
Comparison: 10/12/2018 screening chest CT.

CLINICAL DATA: 69-year-old asymptomatic female current smoker with
35.7 pack-year smoking history.

EXAM:
CT CHEST WITHOUT CONTRAST LOW-DOSE FOR LUNG CANCER SCREENING
TECHNIQUE: Multidetector CT imaging of the chest was performed following the
standard protocol without IV contrast.

[Series 3: lung 1.00 · axial · 0.55mm/px · z∈[-1235,-922]mm · 12 of 347 slices shown, 15 images]
[im 17/347  mediastinal]
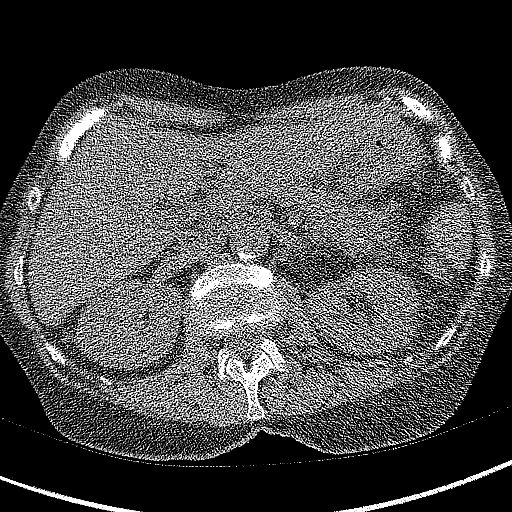
[im 17/347  lung]
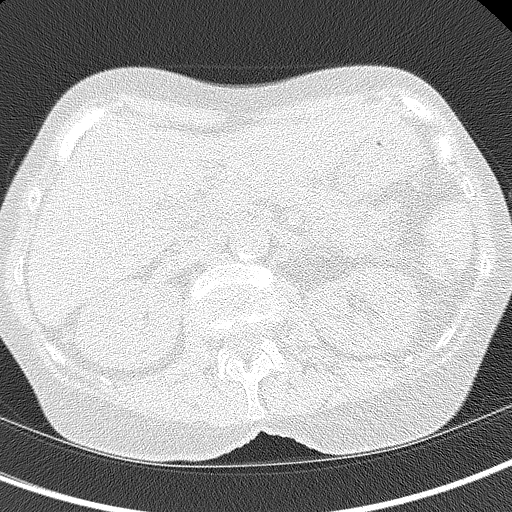
[im 50/347  lung]
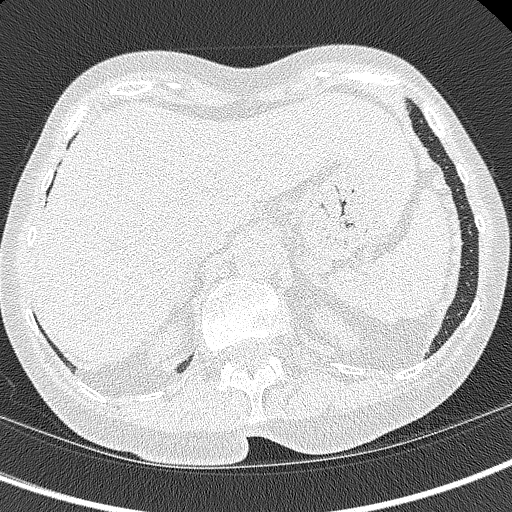
[im 83/347  lung]
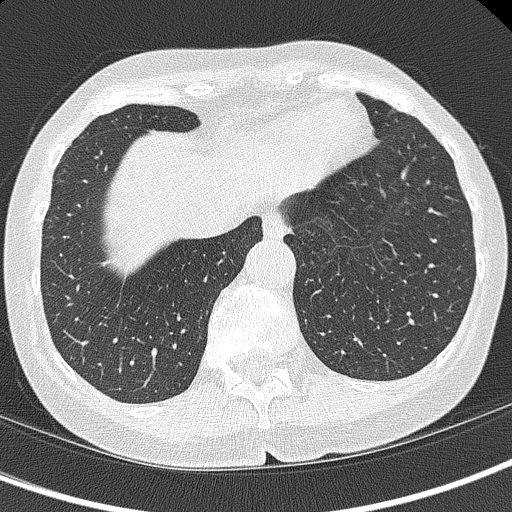
[im 99/347  lung]
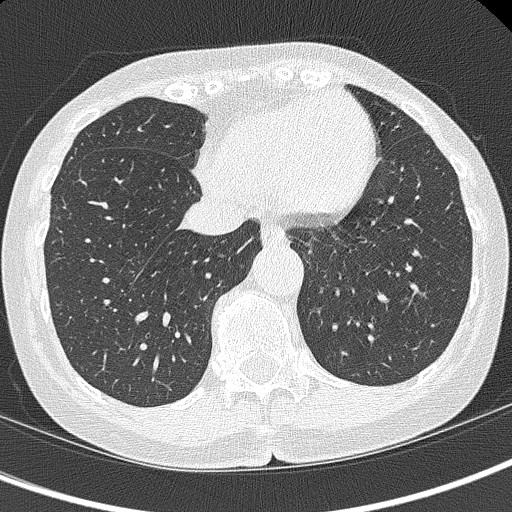
[im 132/347  mediastinal]
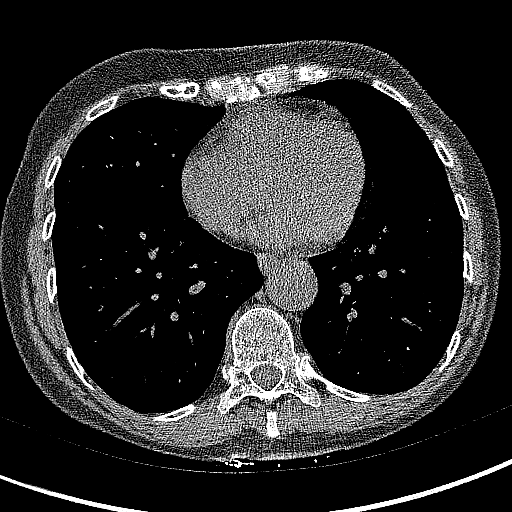
[im 132/347  lung]
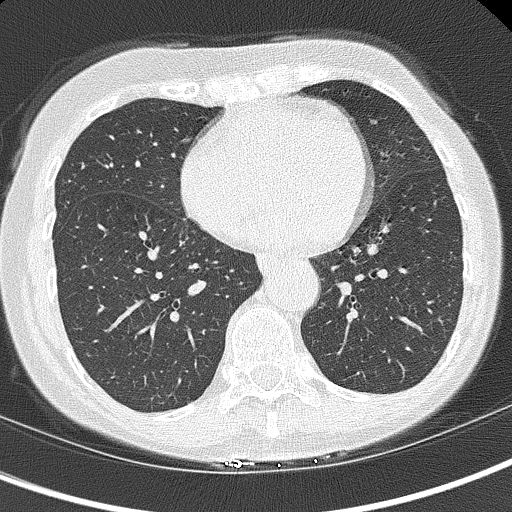
[im 165/347  lung]
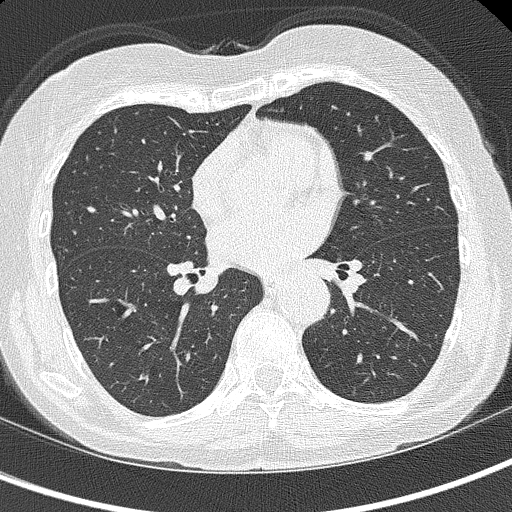
[im 182/347  lung]
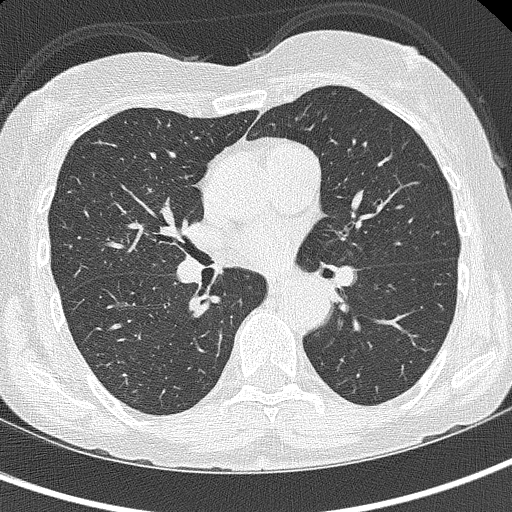
[im 215/347  lung]
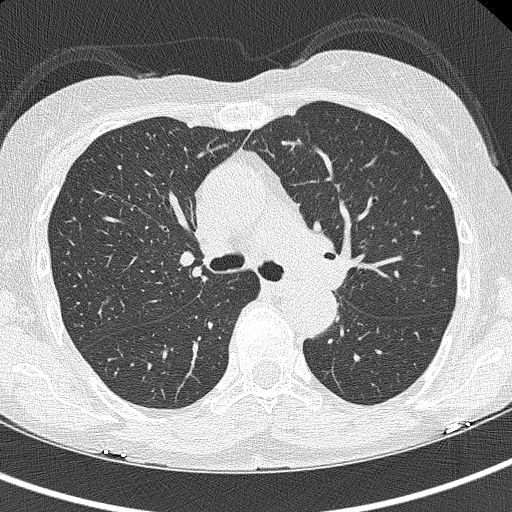
[im 248/347  mediastinal]
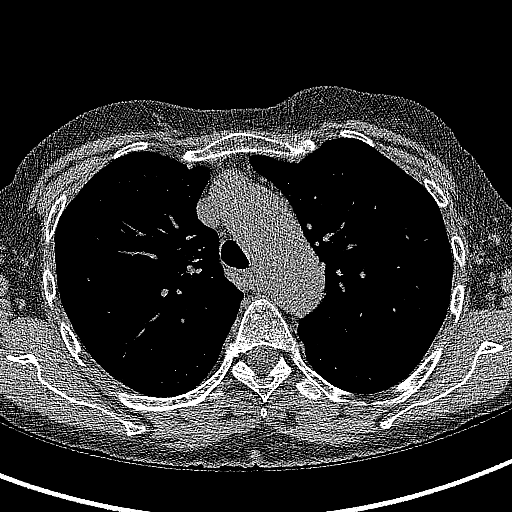
[im 248/347  lung]
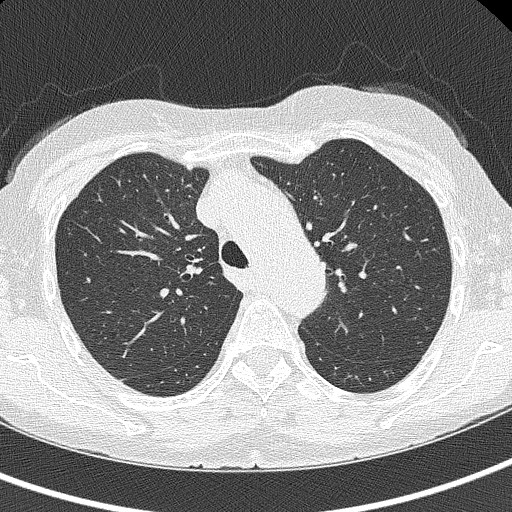
[im 264/347  lung]
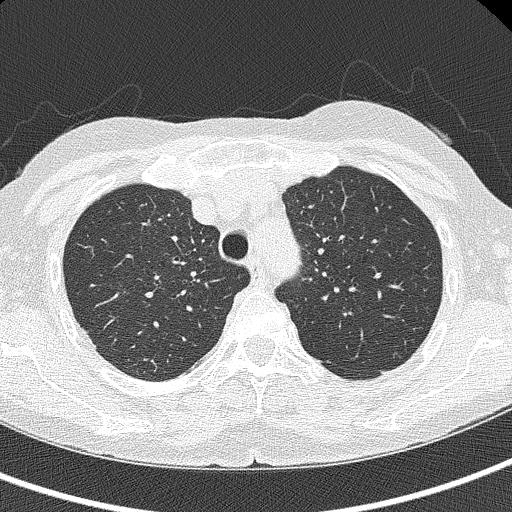
[im 297/347  lung]
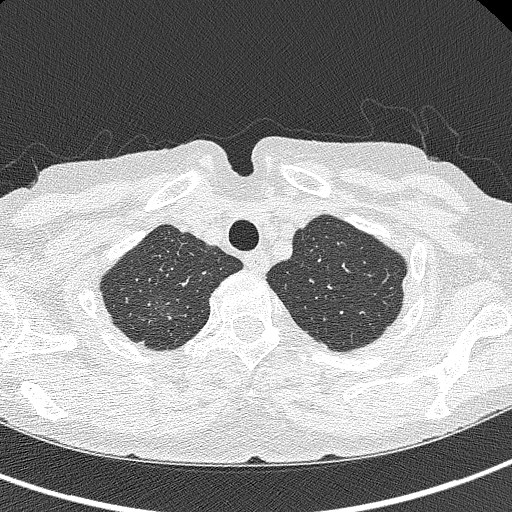
[im 330/347  lung]
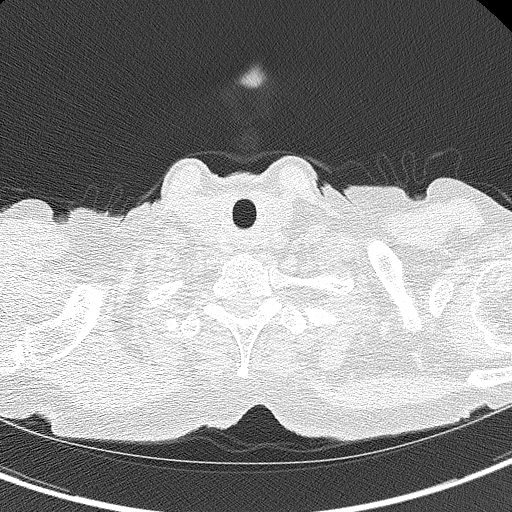

[Series 4: coronals lung 1.00 cor · coronal · 0.55mm/px · 3 of 249 slices shown]
[im 50/249  lung]
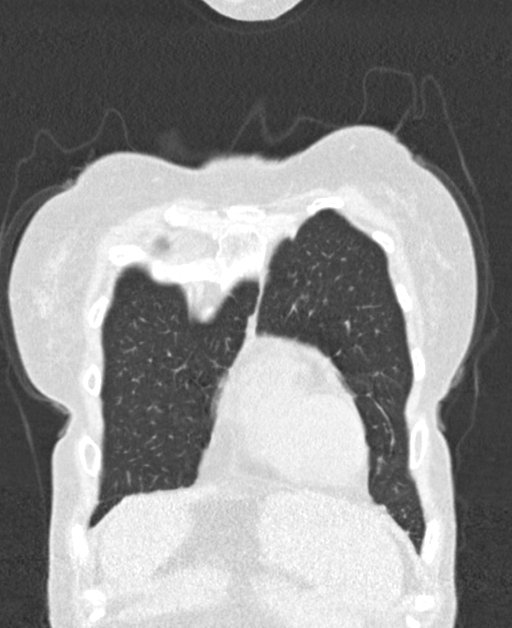
[im 100/249  lung]
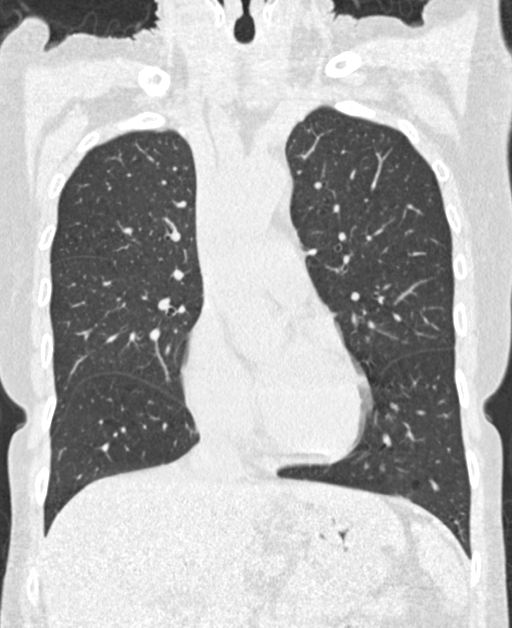
[im 149/249  lung]
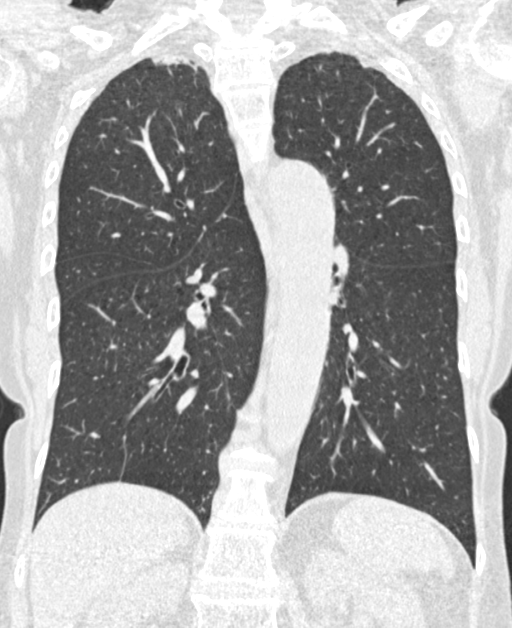

[15 of 40 positions shown; findings below may reference images not displayed]

FINDINGS: Cardiovascular: Normal heart size. No significant pericardial
effusion/thickening. Atherosclerotic nonaneurysmal thoracic aorta.
Normal caliber pulmonary arteries.

Mediastinum/Nodes: No discrete thyroid nodules. Unremarkable
esophagus. No pathologically enlarged axillary, mediastinal or hilar
lymph nodes, noting limited sensitivity for the detection of hilar
adenopathy on this noncontrast study.

Lungs/Pleura: No pneumothorax. No pleural effusion. Moderate
centrilobular emphysema with mild diffuse bronchial wall thickening.
No acute consolidative airspace disease or lung masses.
Reticulonodular scarring at the right greater than left lung apices
is unchanged. No significant growth of previously visualized
scattered small pulmonary nodules. No new significant pulmonary
nodules.

Upper abdomen: No acute abnormality.

Musculoskeletal: No aggressive appearing focal osseous lesions. Mild
thoracic spondylosis.
IMPRESSION: 1. Lung-RADS 2, benign appearance or behavior. Continue annual
screening with low-dose chest CT without contrast in 12 months.
2. Aortic Atherosclerosis (3XOX5-35X.X) and Emphysema (3XOX5-Z18.T).

## 2022-08-18 ENCOUNTER — Other Ambulatory Visit: Payer: Self-pay | Admitting: Family Medicine

## 2022-08-18 DIAGNOSIS — J441 Chronic obstructive pulmonary disease with (acute) exacerbation: Secondary | ICD-10-CM

## 2022-08-19 NOTE — Telephone Encounter (Signed)
Requested Prescriptions  Pending Prescriptions Disp Refills   albuterol (VENTOLIN HFA) 108 (90 Base) MCG/ACT inhaler [Pharmacy Med Name: Albuterol Sulfate HFA 108 (90 Base) MCG/ACT Inhalation Aerosol Solution] 9 g 0    Sig: INHALE 2 PUFFS BY MOUTH EVERY 6 HOURS AS NEEDED FOR WHEEZING FOR SHORTNESS OF BREATH     Pulmonology:  Beta Agonists 2 Passed - 08/18/2022  6:52 AM      Passed - Last BP in normal range    BP Readings from Last 1 Encounters:  06/10/22 125/60         Passed - Last Heart Rate in normal range    Pulse Readings from Last 1 Encounters:  06/10/22 86         Passed - Valid encounter within last 12 months    Recent Outpatient Visits           2 months ago COPD exacerbation (HCC)   [facility] Jacky Kindle, FNP   3 months ago Annual physical exam   [facility] Jacky Kindle, FNP   7 months ago Bacterial sinusitis   [facility] Jacky Kindle, FNP   7 months ago Acute cough   [facility] Dense M, DO   9 months ago Centrilobular emphysema [facility])   [facility] Jacky Kindle, FNP       Future Appointments             In 2 weeks Deirdre Evener, MD [facility]   In 2 months Jacky Kindle, FNP [facility], [facility]

## 2022-08-28 ENCOUNTER — Encounter: Payer: Self-pay | Admitting: Family Medicine

## 2022-08-28 ENCOUNTER — Ambulatory Visit (INDEPENDENT_AMBULATORY_CARE_PROVIDER_SITE_OTHER): Payer: 59 | Admitting: Family Medicine

## 2022-08-28 VITALS — BP 108/60 | HR 75 | Ht 68.0 in | Wt 142.4 lb

## 2022-08-28 DIAGNOSIS — J441 Chronic obstructive pulmonary disease with (acute) exacerbation: Secondary | ICD-10-CM | POA: Diagnosis not present

## 2022-08-28 DIAGNOSIS — H6122 Impacted cerumen, left ear: Secondary | ICD-10-CM

## 2022-08-28 DIAGNOSIS — H66006 Acute suppurative otitis media without spontaneous rupture of ear drum, recurrent, bilateral: Secondary | ICD-10-CM | POA: Diagnosis not present

## 2022-08-28 MED ORDER — CIPROFLOXACIN-DEXAMETHASONE 0.3-0.1 % OT SUSP
4.0000 [drp] | Freq: Two times a day (BID) | OTIC | 0 refills | Status: DC
Start: 2022-08-28 — End: 2022-11-13

## 2022-08-28 MED ORDER — AMOXICILLIN-POT CLAVULANATE 875-125 MG PO TABS
1.0000 | ORAL_TABLET | Freq: Two times a day (BID) | ORAL | 0 refills | Status: DC
Start: 2022-08-28 — End: 2022-11-13

## 2022-08-28 MED ORDER — PREDNISONE 50 MG PO TABS
50.0000 mg | ORAL_TABLET | Freq: Every day | ORAL | 0 refills | Status: DC
Start: 2022-08-28 — End: 2022-11-13

## 2022-08-28 NOTE — Progress Notes (Unsigned)
    Established patient visit   Patient: Jennifer [name]   DOB: 07-13-1950   72 y.o. Female  MRN: [id number] Visit Date: 08/28/2022  Today's healthcare provider: Jacky Kindle, FNP  Introduced to nurse practitioner role and practice setting.  All questions answered.  Discussed provider/patient relationship and expectations.  Subjective    HPI HPI   Patient is present due to glands near right ear being swollen. Patient has noticed for 2 weeks this time. Reports hx of it for 14 yes but is concerning her because it has not stopped this time and everything she eats it swells up and she has a knot below her ear.  Last edited by Acey Lav, CMA on 08/28/2022  3:23 PM.      Medications: Outpatient Medications Prior to Visit  Medication Sig   albuterol (VENTOLIN HFA) 108 (90 Base) MCG/ACT inhaler INHALE 2 PUFFS BY MOUTH EVERY 6 HOURS AS NEEDED FOR WHEEZING FOR SHORTNESS OF BREATH   alendronate (FOSAMAX) 70 MG tablet TAKE 1 TABLET EVERY WEEK  ON  AN  EMPTY  STOMACH WITH A FULL GLASS OF WATER   atorvastatin (LIPITOR) 10 MG tablet Take 1 tablet by mouth once daily   b complex vitamins capsule Take 1 capsule by mouth daily.   Calcium Citrate-Vitamin D 315-5 MG-MCG TABS    fluticasone (FLONASE) 50 MCG/ACT nasal spray Place 2 sprays into both nostrils daily.   Fluticasone-Umeclidin-Vilant (TRELEGY ELLIPTA) 100-62.5-25 MCG/ACT AEPB Inhale 1 puff into the lungs daily.   levofloxacin (LEVAQUIN) 750 MG tablet Take 1 tablet (750 mg total) by mouth daily.   triamterene-hydrochlorothiazide (MAXZIDE-25) 37.5-25 MG tablet Take 1 tablet by mouth daily.   predniSONE (DELTASONE) 50 MG tablet Take 1 tablet (50 mg total) by mouth daily with breakfast. (Patient not taking: Reported on 07/15/2022)   No facility-administered medications prior to visit.       Objective    BP 108/60 (BP Location: Left Arm, Patient Position: Sitting, Cuff Size: Normal)   Pulse 75   Ht 5\' 8"  (1.727 m)   Wt 142 lb 6.4 oz (64.6  kg)   SpO2 99%   BMI 21.65 kg/m   Physical Exam   No results found for any visits on 08/28/22.  Assessment & Plan     Problem List Items Addressed This Visit   None   No follow-ups on file.      Leilani Merl, FNP, have reviewed all documentation for this visit. The documentation on 08/28/22 for the exam, diagnosis, procedures, and orders are all accurate and complete.  Jacky Kindle, FNP  [facility] [phone] (phone) [phone] (fax)  [facility]

## 2022-08-29 ENCOUNTER — Encounter: Payer: Self-pay | Admitting: Family Medicine

## 2022-08-29 DIAGNOSIS — J441 Chronic obstructive pulmonary disease with (acute) exacerbation: Secondary | ICD-10-CM | POA: Insufficient documentation

## 2022-08-29 DIAGNOSIS — H66006 Acute suppurative otitis media without spontaneous rupture of ear drum, recurrent, bilateral: Secondary | ICD-10-CM | POA: Insufficient documentation

## 2022-08-29 NOTE — Assessment & Plan Note (Signed)
R ear complaints; however, L ear with impacted cerumen- removed large clump size of bean Pt tolerated well Both Tms red and inflamed with fluid causing bulging Recommend abx

## 2022-08-29 NOTE — Assessment & Plan Note (Signed)
2 weeks of symptoms Unrelieved with OTC treatment Recommend abx and steroids Continued emphasis on smoking reduction with goal of cessation to further assist

## 2022-09-03 ENCOUNTER — Ambulatory Visit: Payer: 59 | Admitting: Dermatology

## 2022-09-08 ENCOUNTER — Ambulatory Visit: Payer: 59 | Admitting: Dermatology

## 2022-10-05 ENCOUNTER — Other Ambulatory Visit: Payer: Self-pay | Admitting: Family Medicine

## 2022-10-05 DIAGNOSIS — Z1231 Encounter for screening mammogram for malignant neoplasm of breast: Secondary | ICD-10-CM

## 2022-10-22 ENCOUNTER — Other Ambulatory Visit: Payer: Self-pay | Admitting: Family Medicine

## 2022-10-22 DIAGNOSIS — E7800 Pure hypercholesterolemia, unspecified: Secondary | ICD-10-CM

## 2022-10-22 NOTE — Telephone Encounter (Signed)
Requested Prescriptions  Pending Prescriptions Disp Refills   atorvastatin (LIPITOR) 10 MG tablet [Pharmacy Med Name: Atorvastatin Calcium 10 MG Oral Tablet] 90 tablet 0    Sig: Take 1 tablet by mouth once daily     Cardiovascular:  Antilipid - Statins Failed - 10/22/2022  6:52 AM      Failed - Lipid Panel in normal range within the last 12 months    Cholesterol, Total  Date Value Ref Range Status  05/14/2022 197 100 - 199 mg/dL Final   LDL Chol Calc (NIH)  Date Value Ref Range Status  05/14/2022 86 0 - 99 mg/dL Final   HDL  Date Value Ref Range Status  05/14/2022 88 >39 mg/dL Final   Triglycerides  Date Value Ref Range Status  05/14/2022 135 0 - 149 mg/dL Final         Passed - Patient is not pregnant      Passed - Valid encounter within last 12 months    Recent Outpatient Visits           1 month ago COPD with acute exacerbation (HCC)   [facility] Merita Norton T, FNP   4 months ago COPD exacerbation [facility])   [facility] Jacky Kindle, FNP   5 months ago Annual physical exam   [facility] Jacky Kindle, FNP   9 months ago Bacterial sinusitis   [facility] Jacky Kindle, FNP   10 months ago Acute cough   [facility] [place], Darl Householder, DO       Future Appointments             In 3 weeks Jacky Kindle, FNP [facility], [facility]

## 2022-11-13 ENCOUNTER — Encounter: Payer: Self-pay | Admitting: Family Medicine

## 2022-11-13 ENCOUNTER — Ambulatory Visit (INDEPENDENT_AMBULATORY_CARE_PROVIDER_SITE_OTHER): Payer: 59 | Admitting: Family Medicine

## 2022-11-13 VITALS — BP 147/83 | HR 91 | Temp 98.0°F | Ht 68.0 in | Wt 140.8 lb

## 2022-11-13 DIAGNOSIS — I700 Atherosclerosis of aorta: Secondary | ICD-10-CM

## 2022-11-13 DIAGNOSIS — Z1239 Encounter for other screening for malignant neoplasm of breast: Secondary | ICD-10-CM | POA: Diagnosis not present

## 2022-11-13 DIAGNOSIS — M810 Age-related osteoporosis without current pathological fracture: Secondary | ICD-10-CM | POA: Diagnosis not present

## 2022-11-13 DIAGNOSIS — F339 Major depressive disorder, recurrent, unspecified: Secondary | ICD-10-CM | POA: Diagnosis not present

## 2022-11-13 DIAGNOSIS — J432 Centrilobular emphysema: Secondary | ICD-10-CM | POA: Diagnosis not present

## 2022-11-13 DIAGNOSIS — I10 Essential (primary) hypertension: Secondary | ICD-10-CM

## 2022-11-13 DIAGNOSIS — Z720 Tobacco use: Secondary | ICD-10-CM | POA: Diagnosis not present

## 2022-11-13 DIAGNOSIS — F1021 Alcohol dependence, in remission: Secondary | ICD-10-CM

## 2022-11-13 DIAGNOSIS — R7309 Other abnormal glucose: Secondary | ICD-10-CM | POA: Diagnosis not present

## 2022-11-13 DIAGNOSIS — E7800 Pure hypercholesterolemia, unspecified: Secondary | ICD-10-CM | POA: Diagnosis not present

## 2022-11-13 NOTE — Progress Notes (Signed)
Established patient visit   Patient: Jennifer [name]   DOB: 10/22/1950   72 y.o. Female  MRN: [id number] Visit Date: 11/13/2022  Today's healthcare provider: Jacky Kindle, FNP  Introduced to nurse practitioner role and practice setting.  All questions answered.  Discussed provider/patient relationship and expectations.  Subjective    HPI HPI     Medical Management of Chronic Issues    Additional comments: 6 month follow up, no concerns today       Last edited by Rolly Salter, CMA on 11/13/2022  1:10 PM.      The patient presents for a six month follow up. She expresses concern about her cholesterol levels, which were high at her last visit, and is worried about the potential need to increase her Lipitor dosage. She also mentions that her blood pressure was borderline at 145, which she attributes to smoking prior to the appointment.  The patient reports no issues with her breathing and has not needed to use her albuterol inhaler. She admits to not using her Trelegy inhaler daily as recommended, but agrees to try to do so moving forward.  She also mentions a pain in her left side when she sleeps on it and tries to turn over. She describes the pain as being in the upper side of the belly or possibly in the bones, and she has to hold her breast for the pain to subside. No pain present at current time; continue to monitor.   The patient denies any recent falls and reports that her mood has been okay, although she hints at some personal issues causing occasional distress.  Medications: Outpatient Medications Prior to Visit  Medication Sig   albuterol (VENTOLIN HFA) 108 (90 Base) MCG/ACT inhaler INHALE 2 PUFFS BY MOUTH EVERY 6 HOURS AS NEEDED FOR WHEEZING FOR SHORTNESS OF BREATH   alendronate (FOSAMAX) 70 MG tablet TAKE 1 TABLET EVERY WEEK  ON  AN  EMPTY  STOMACH WITH A FULL GLASS OF WATER   atorvastatin (LIPITOR) 10 MG tablet Take 1 tablet by mouth once daily   b complex vitamins  capsule Take 1 capsule by mouth daily.   Calcium Citrate-Vitamin D 315-5 MG-MCG TABS    fluticasone (FLONASE) 50 MCG/ACT nasal spray Place 2 sprays into both nostrils daily.   Fluticasone-Umeclidin-Vilant (TRELEGY ELLIPTA) 100-62.5-25 MCG/ACT AEPB Inhale 1 puff into the lungs daily.   triamterene-hydrochlorothiazide (MAXZIDE-25) 37.5-25 MG tablet Take 1 tablet by mouth daily.   [DISCONTINUED] ciprofloxacin-dexamethasone (CIPRODEX) OTIC suspension Place 4 drops into both ears 2 (two) times daily.   [DISCONTINUED] levofloxacin (LEVAQUIN) 750 MG tablet Take 1 tablet (750 mg total) by mouth daily.   [DISCONTINUED] amoxicillin-clavulanate (AUGMENTIN) 875-125 MG tablet Take 1 tablet by mouth 2 (two) times daily.   [DISCONTINUED] predniSONE (DELTASONE) 50 MG tablet Take 1 tablet (50 mg total) by mouth daily with breakfast.   No facility-administered medications prior to visit.    Review of Systems  Last CBC Lab Results  Component Value Date   WBC 5.9 05/14/2022   HGB 13.7 05/14/2022   HCT 41.7 05/14/2022   MCV 92 05/14/2022   MCH 30.3 05/14/2022   RDW 13.6 05/14/2022   PLT 181 05/14/2022   Last metabolic panel Lab Results  Component Value Date   GLUCOSE 82 05/12/2021   NA 143 05/12/2021   K 4.5 05/12/2021   CL 108 (H) 05/12/2021   CO2 22 05/12/2021   BUN 7 (L) 05/12/2021   CREATININE 0.91 05/12/2021  EGFR 68 05/12/2021   CALCIUM 9.5 05/12/2021   PHOS 2.3 (L) 07/18/2017   PROT 6.7 05/12/2021   ALBUMIN 4.6 05/12/2021   LABGLOB 2.1 05/12/2021   AGRATIO 2.2 05/12/2021   BILITOT 0.5 05/12/2021   ALKPHOS 75 05/12/2021   AST 20 05/12/2021   ALT 14 05/12/2021   ANIONGAP 12 08/24/2018   Last lipids Lab Results  Component Value Date   CHOL 197 05/14/2022   HDL 88 05/14/2022   LDLCALC 86 05/14/2022   TRIG 135 05/14/2022   CHOLHDL 2.2 05/14/2022   Last hemoglobin A1c Lab Results  Component Value Date   HGBA1C 5.0 07/13/2017   Last thyroid functions Lab Results   Component Value Date   TSH 2.520 11/19/2020   Last vitamin D No results found for: "25OHVITD2", "25OHVITD3", "VD25OH" Last vitamin B12 and Folate No results found for: "VITAMINB12", "FOLATE"     Objective    BP (!) 147/83   Pulse 91   Temp 98 F (36.7 C) (Oral)   Ht 5\' 8"  (1.727 m)   Wt 140 lb 12.8 oz (63.9 kg)   SpO2 99%   BMI 21.41 kg/m  BP Readings from Last 3 Encounters:  11/13/22 (!) 147/83  08/28/22 108/60  06/10/22 125/60   Wt Readings from Last 3 Encounters:  11/13/22 140 lb 12.8 oz (63.9 kg)  08/28/22 142 lb 6.4 oz (64.6 kg)  07/15/22 142 lb (64.4 kg)   SpO2 Readings from Last 3 Encounters:  11/13/22 99%  08/28/22 99%  06/10/22 97%   Physical Exam Vitals and nursing note reviewed.  Constitutional:      General: She is not in acute distress.    Appearance: Normal appearance. She is normal weight. She is not ill-appearing, toxic-appearing or diaphoretic.  HENT:     Head: Normocephalic and atraumatic.  Cardiovascular:     Rate and Rhythm: Normal rate and regular rhythm.     Pulses: Normal pulses.     Heart sounds: Normal heart sounds. No murmur heard.    No friction rub. No gallop.  Pulmonary:     Effort: Pulmonary effort is normal. No respiratory distress.     Breath sounds: Normal breath sounds. No stridor. No wheezing, rhonchi or rales.  Chest:     Chest wall: No tenderness.  Abdominal:     General: Bowel sounds are normal.     Palpations: Abdomen is soft.     Tenderness: There is no abdominal tenderness. There is no guarding.  Musculoskeletal:        General: No swelling, tenderness, deformity or signs of injury. Normal range of motion.     Right lower leg: No edema.     Left lower leg: No edema.  Skin:    General: Skin is warm and dry.     Capillary Refill: Capillary refill takes less than 2 seconds.     Coloration: Skin is not jaundiced or pale.     Findings: No bruising, erythema, lesion or rash.  Neurological:     General: No focal  deficit present.     Mental Status: She is alert and oriented to person, place, and time. Mental status is at baseline.     Cranial Nerves: No cranial nerve deficit.     Sensory: No sensory deficit.     Motor: No weakness.     Coordination: Coordination normal.  Psychiatric:        Mood and Affect: Mood normal.        Behavior: Behavior  normal.        Thought Content: Thought content normal.        Judgment: Judgment normal.     No results found for any visits on 11/13/22.  Assessment & Plan     Problem List Items Addressed This Visit       Cardiovascular and Mediastinum   Aortic atherosclerosis (HCC)   Relevant Orders   Lipid panel     Musculoskeletal and Integument   Osteoporosis   Relevant Orders   Vitamin D (25 hydroxy)   DG Bone Density     Other   Depression, recurrent (HCC) - Primary   History of alcoholism (HCC)   Hypercholesterolemia   Relevant Orders   Lipid panel   Tobacco abuse   Other Visit Diagnoses     Centrilobular emphysema (HCC)       Screening breast examination       Relevant Orders   MM 3D SCREENING MAMMOGRAM BILATERAL BREAST   Primary hypertension       Relevant Orders   Comprehensive Metabolic Panel (CMET)   CBC   TSH   Abnormal glucose       Relevant Orders   Hemoglobin A1c      Hyperlipidemia Concern for elevated cholesterol levels. Currently on Lipitor 10mg . -Order lipid panel today to assess current levels.  Hypertension Borderline elevated blood pressure at 145, possibly due to recent smoking. -Continue to monitor blood pressure.  COPD Patient reports not using Trelegy inhaler daily as recommended. No recent need for albuterol. -Encourage daily use of Trelegy inhaler for control of inflammation in lungs.  Abdominal Pain Reports pain in left upper quadrant when turning in sleep. Differential includes splenic issues, constipation, pericarditis. -Order complete blood count to assess for elevated infection cell  count. -Consider anti-inflammatories if pain persists.  General Health Maintenance -Eligible for shingles vaccine at pharmacy. -Recommend bone scan to assess status after Fosamax. -Mammogram scheduled for next week. -Bone density test scheduled for January. -Plan follow-up appointment in April. Return in about 6 months (around 05/17/2023) for annual examination.      Leilani Merl, FNP, have reviewed all documentation for this visit. The documentation on 11/13/22 for the exam, diagnosis, procedures, and orders are all accurate and complete.  Jacky Kindle, FNP  [facility] [phone] (phone) [phone] (fax)  [facility]

## 2022-11-14 LAB — LIPID PANEL
Chol/HDL Ratio: 1.8 {ratio} (ref 0.0–4.4)
Cholesterol, Total: 229 mg/dL — ABNORMAL HIGH (ref 100–199)
HDL: 127 mg/dL (ref >39)
LDL Chol Calc (NIH): 87 mg/dL (ref 0–99)
Triglycerides: 92 mg/dL (ref 0–149)
VLDL Cholesterol Cal: 15 mg/dL (ref 5–40)

## 2022-11-14 LAB — COMPREHENSIVE METABOLIC PANEL
ALT: 21 [IU]/L (ref 0–32)
AST: 31 [IU]/L (ref 0–40)
Albumin: 4.6 g/dL (ref 3.9–4.9)
Alkaline Phosphatase: 81 [IU]/L (ref 44–121)
BUN/Creatinine Ratio: 7 — ABNORMAL LOW (ref 12–28)
BUN: 6 mg/dL — ABNORMAL LOW (ref 8–27)
Bilirubin Total: 0.7 mg/dL (ref 0.0–1.2)
CO2: 22 mmol/L (ref 20–29)
Calcium: 10.1 mg/dL (ref 8.7–10.3)
Chloride: 99 mmol/L (ref 96–106)
Creatinine, Ser: 0.81 mg/dL (ref 0.57–1.00)
Globulin, Total: 2.2 g/dL (ref 1.5–4.5)
Glucose: 94 mg/dL (ref 70–99)
Potassium: 3.9 mmol/L (ref 3.5–5.2)
Sodium: 138 mmol/L (ref 134–144)
Total Protein: 6.8 g/dL (ref 6.0–8.5)
eGFR: 78 mL/min/{1.73_m2} (ref >59)

## 2022-11-14 LAB — HEMOGLOBIN A1C
Est. average glucose Bld gHb Est-mCnc: 97 mg/dL
Hgb A1c MFr Bld: 5 % (ref 4.8–5.6)

## 2022-11-14 LAB — CBC
Hematocrit: 45 % (ref 34.0–46.6)
Hemoglobin: 15 g/dL (ref 11.1–15.9)
MCH: 31.6 pg (ref 26.6–33.0)
MCHC: 33.3 g/dL (ref 31.5–35.7)
MCV: 95 fL (ref 79–97)
Platelets: 180 10*3/uL (ref 150–450)
RBC: 4.75 x10E6/uL (ref 3.77–5.28)
RDW: 13.6 % (ref 11.7–15.4)
WBC: 5.7 10*3/uL (ref 3.4–10.8)

## 2022-11-14 LAB — TSH: TSH: 3 u[IU]/mL (ref 0.450–4.500)

## 2022-11-14 LAB — VITAMIN D 25 HYDROXY (VIT D DEFICIENCY, FRACTURES): Vit D, 25-Hydroxy: 34.6 ng/mL (ref 30.0–100.0)

## 2022-11-14 NOTE — Progress Notes (Signed)
While total cholesterol is increased; LDL remains stable. I continue to recommend diet low in saturated fat and regular exercise - 30 min at least 5 times per week in addition to smoking reduction with goal of cessation. Continue 2000 IU Vit D3 daily.

## 2022-11-19 ENCOUNTER — Ambulatory Visit
Admission: RE | Admit: 2022-11-19 | Discharge: 2022-11-19 | Disposition: A | Discharge status: Home / Self Care | Payer: 59 | Source: Ambulatory Visit | Attending: Family Medicine | Admitting: Family Medicine

## 2022-11-19 DIAGNOSIS — Z1231 Encounter for screening mammogram for malignant neoplasm of breast: Secondary | ICD-10-CM | POA: Diagnosis not present

## 2023-01-21 ENCOUNTER — Other Ambulatory Visit: Payer: Self-pay | Admitting: Family Medicine

## 2023-01-21 DIAGNOSIS — J432 Centrilobular emphysema: Secondary | ICD-10-CM

## 2023-02-15 DIAGNOSIS — M810 Age-related osteoporosis without current pathological fracture: Secondary | ICD-10-CM | POA: Diagnosis not present

## 2023-03-19 ENCOUNTER — Other Ambulatory Visit: Payer: Self-pay | Admitting: *Deleted

## 2023-03-19 DIAGNOSIS — Z122 Encounter for screening for malignant neoplasm of respiratory organs: Secondary | ICD-10-CM

## 2023-03-19 DIAGNOSIS — F17210 Nicotine dependence, cigarettes, uncomplicated: Secondary | ICD-10-CM

## 2023-03-19 DIAGNOSIS — Z87891 Personal history of nicotine dependence: Secondary | ICD-10-CM

## 2023-04-26 ENCOUNTER — Telehealth: Payer: Self-pay | Admitting: Family Medicine

## 2023-04-26 DIAGNOSIS — E7800 Pure hypercholesterolemia, unspecified: Secondary | ICD-10-CM

## 2023-04-26 MED ORDER — ATORVASTATIN CALCIUM 10 MG PO TABS: 10.0000 mg | ORAL_TABLET | Freq: Every day | ORAL | 0 refills | Status: DC

## 2023-04-26 NOTE — Telephone Encounter (Signed)
 [facility] pharmacy is requesting refill atorvastatin (LIPITOR) 10 MG tablet  Please advise

## 2023-05-03 ENCOUNTER — Ambulatory Visit: Payer: 59

## 2023-05-08 ENCOUNTER — Emergency Department
Admission: EM | Admit: 2023-05-08 | Discharge: 2023-05-08 | Disposition: A | Discharge status: Home / Self Care | Attending: Emergency Medicine | Admitting: Emergency Medicine

## 2023-05-08 ENCOUNTER — Other Ambulatory Visit: Payer: Self-pay

## 2023-05-08 ENCOUNTER — Encounter: Payer: Self-pay | Admitting: Emergency Medicine

## 2023-05-08 DIAGNOSIS — F1010 Alcohol abuse, uncomplicated: Secondary | ICD-10-CM | POA: Diagnosis present

## 2023-05-08 NOTE — ED Provider Notes (Signed)
   [facility] Provider Note    Event Date/Time   First MD Initiated Contact with Patient 05/08/23 1715     (approximate)   History   detox   HPI  SOUL DEVENEY is a 73 y.o. female who presents to the [facility] department today for detox.  Patient states she has history of alcohol abuse.  Last use today.  Denies any recent illness.  Denies any chronic medical problems.  States that she would like inpatient detox. Denies history of seizures or hallucinations if she goes a few days without drinking.      Physical Exam   Triage Vital Signs: ED Triage Vitals  Encounter Vitals Group     BP 05/08/23 1659 (!) 144/75     Systolic BP Percentile --      Diastolic BP Percentile --      Pulse Rate 05/08/23 1659 (!) 105     Resp 05/08/23 1659 16     Temp 05/08/23 1659 98.6 F (37 C)     Temp Source 05/08/23 1659 Oral     SpO2 05/08/23 1659 96 %     Weight 05/08/23 1658 140 lb 14 oz (63.9 kg)     Height --      Head Circumference --      Peak Flow --      Pain Score 05/08/23 1658 0     Pain Loc --      Pain Education --      Exclude from Growth Chart --     Most recent vital signs: Vitals:   05/08/23 1659  BP: (!) 144/75  Pulse: (!) 105  Resp: 16  Temp: 98.6 F (37 C)  SpO2: 96%   General: Awake, alert, oriented. CV:  Good peripheral perfusion.  Resp:  Normal effort.  Abd:  No distention.    ED Results / Procedures / Treatments   Labs (all labs ordered are listed, but only abnormal results are displayed) Labs Reviewed - No data to display   EKG  None   RADIOLOGY None   PROCEDURES:  Critical Care performed: No   MEDICATIONS ORDERED IN ED: Medications - No data to display   IMPRESSION / MDM / ASSESSMENT AND PLAN / ED COURSE  I reviewed the triage vital signs and the nursing notes.                              Differential diagnosis includes, but is not limited to, alcohol abuse  Patient presented to the emergency  department today for detox from alcohol.  Patient would like a residential detox facility.  I did offer to have patient speak to TTS for help with placement at residential facility.  She declined.  She stated she would like to be discharged home.  We will give patient information on residential facilities.      FINAL CLINICAL IMPRESSION(S) / ED DIAGNOSES   Final diagnoses:  Alcohol abuse    Note:  This document was prepared using Dragon voice recognition software and may include unintentional dictation errors.    Marylynn Soho, MD 05/08/23 [phone]

## 2023-05-08 NOTE — ED Triage Notes (Signed)
 States "I am an alcoholic" patient requesting help to detox from alcohol.  Denies SI/HI.  Drinks a pint of vodka a day, last drink about one hour ago.

## 2023-05-08 NOTE — ED Notes (Signed)
 Pt seen buy EDP priopr to RN assessment, see MD notes, orders received to d/c. Given detox resources. Given taxi voucher. Taxi here for transport. Denies questions or needs.

## 2023-05-08 NOTE — Discharge Instructions (Signed)
 Please call these facilities today for placement in a residential facility.

## 2023-05-12 ENCOUNTER — Ambulatory Visit: Payer: 59 | Admitting: Family Medicine

## 2023-05-28 ENCOUNTER — Other Ambulatory Visit: Payer: Self-pay | Admitting: Family Medicine

## 2023-05-28 DIAGNOSIS — E7800 Pure hypercholesterolemia, unspecified: Secondary | ICD-10-CM

## 2023-05-31 NOTE — Telephone Encounter (Signed)
 Requested Prescriptions  Pending Prescriptions Disp Refills   atorvastatin  (LIPITOR) 10 MG tablet [Pharmacy Med Name: Atorvastatin  Calcium  10 MG Oral Tablet] 90 tablet 0    Sig: TAKE 1 TABLET BY MOUTH ONCE DAILY (PLEASE KEEP APPOINTMENT FOR FURTHER REFILLS)     Cardiovascular:  Antilipid - Statins Failed - 05/31/2023  2:46 PM      Failed - Valid encounter within last 12 months    Recent Outpatient Visits   None     Future Appointments             In 2 weeks Simmons-Robinson, Makiera, MD [facility]            Failed - Lipid Panel in normal range within the last 12 months    Cholesterol, Total  Date Value Ref Range Status  11/13/2022 229 (H) 100 - 199 mg/dL Final   LDL Chol Calc (NIH)  Date Value Ref Range Status  11/13/2022 87 0 - 99 mg/dL Final   HDL  Date Value Ref Range Status  11/13/2022 127 >39 mg/dL Final   Triglycerides  Date Value Ref Range Status  11/13/2022 92 0 - 149 mg/dL Final         Passed - Patient is not pregnant

## 2023-06-16 ENCOUNTER — Ambulatory Visit: Payer: Self-pay

## 2023-06-16 ENCOUNTER — Encounter: Payer: Self-pay | Admitting: Family Medicine

## 2023-06-16 ENCOUNTER — Other Ambulatory Visit: Payer: Self-pay | Admitting: Family Medicine

## 2023-06-16 ENCOUNTER — Ambulatory Visit: Admitting: Family Medicine

## 2023-06-16 ENCOUNTER — Encounter: Payer: Self-pay | Admitting: Acute Care

## 2023-06-16 VITALS — BP 126/70 | HR 68 | Ht 68.0 in | Wt 133.0 lb

## 2023-06-16 DIAGNOSIS — I700 Atherosclerosis of aorta: Secondary | ICD-10-CM

## 2023-06-16 DIAGNOSIS — E785 Hyperlipidemia, unspecified: Secondary | ICD-10-CM | POA: Diagnosis not present

## 2023-06-16 DIAGNOSIS — M810 Age-related osteoporosis without current pathological fracture: Secondary | ICD-10-CM | POA: Diagnosis not present

## 2023-06-16 DIAGNOSIS — J432 Centrilobular emphysema: Secondary | ICD-10-CM | POA: Insufficient documentation

## 2023-06-16 DIAGNOSIS — F339 Major depressive disorder, recurrent, unspecified: Secondary | ICD-10-CM | POA: Diagnosis not present

## 2023-06-16 DIAGNOSIS — Z720 Tobacco use: Secondary | ICD-10-CM

## 2023-06-16 DIAGNOSIS — F17200 Nicotine dependence, unspecified, uncomplicated: Secondary | ICD-10-CM

## 2023-06-16 MED ORDER — FLUCONAZOLE 150 MG PO TABS: ORAL_TABLET | ORAL | 0 refills | Status: AC

## 2023-06-16 MED ORDER — NICOTINE 14 MG/24HR TD PT24: 14.0000 mg | MEDICATED_PATCH | Freq: Every day | TRANSDERMAL | 11 refills | Status: DC

## 2023-06-16 NOTE — Assessment & Plan Note (Signed)
 Aortic atherosclerosis Aortic atherosclerosis is managed with atorvastatin . - Continue atorvastatin  (Lipitor) 10 mg daily.

## 2023-06-16 NOTE — Assessment & Plan Note (Signed)
  Osteoporosis without current fracture Chronic Osteoporosis is managed by rheumatology/endocrinology with Fosamax  and calcium /vitamin D  supplementation. DEXA scan is scheduled for update. - Continue Fosamax  70 mg weekly. - Continue calcium  citrate and vitamin D  315 mg-5 mcg daily. - DEXA scan to be updated as scheduled with endocrinology  -calcium  and vitamin D  levels monitored by specialist

## 2023-06-16 NOTE — Telephone Encounter (Signed)
 Prescription sent to pharmacy from separate encounter

## 2023-06-16 NOTE — Assessment & Plan Note (Signed)
 Chronic Obstructive Pulmonary Disease (COPD) COPD is well-managed with Trelegy and albuterol . Awaiting scheduling of an updated lung scan by pulmonology. - Continue Trelegy 100-CC 2.5-25 mcg one puff daily. - Continue albuterol  108 mcg two puffs every six hours as needed. - Follow up with pulmonology to schedule lung scan.

## 2023-06-16 NOTE — Progress Notes (Signed)
 Established patient visit   Patient: Jennifer [name]   DOB: 13-Sep-1950   73 y.o. Female  MRN: [id number] Visit Date: 06/16/2023  Today's healthcare provider: Mimi Alt, MD   Chief Complaint  Patient presents with   Follow-up    No concerns    Subjective     HPI     Follow-up    Additional comments: No concerns       Last edited by Bart Lieu, CMA on 06/16/2023  1:42 PM.       Discussed the use of AI scribe software for clinical note transcription with the patient, who gave verbal consent to proceed.  History of Present Illness Jennifer [name] is a 73 year old female who presents for chronic condition follow-up and transfer of care from her previous PCP.  She has a history of aortic atherosclerosis, identified during a CT scan, and is managing this condition with atorvastatin  10 mg daily for hyperlipidemia.  Her COPD is stable and managed with Trelegy 100-CC 2.5-25 micrograms, one puff daily, and albuterol  108 mcg, two puffs every six hours as needed. She does not have a scheduled appointment with pulmonology.  She has osteoporosis and is currently taking Fosamax  70 mg weekly, calcium  citrate, and vitamin D  315-5 mg-mcg daily. She follows with rheumatology and endocrinology for management of her bone density and has an upcoming DEXA scan scheduled.  She has a history of recurrent depression and continues to manage her mental health.  She has a history of high blood pressure, which is currently well-controlled without medication.  She has a history of tobacco use and wants to quit smoking, currently smoking about a pack a day. She previously quit for three years using nicotine  patches.  She reports significant weight loss from 160 lbs to 133 lbs after dietary changes to manage cholesterol. She is concerned about her current weight and is trying to gain weight by focusing on protein intake.  She is taking a B complex vitamin, vitamin D , calcium , and  recently started taking zinc, although she does not recall the dosage.   Past Medical History:  Diagnosis Date   Arthritis    mild   Cancer (HCC)    skin cancer leg and chest - Dr. Adan Holms, and [facility]   COPD (chronic obstructive pulmonary disease) (HCC)    Dysrhythmia    palpitation   Emphysema of lung (HCC)    Family history of colon cancer    Headache    as  a child    Heart murmur    while pregnant   Hyperlipidemia 07/17/2017   Osteoporosis    Sleep apnea    Tobacco abuse     Medications: Outpatient Medications Prior to Visit  Medication Sig   albuterol  (VENTOLIN  HFA) 108 (90 Base) MCG/ACT inhaler INHALE 2 PUFFS BY MOUTH EVERY 6 HOURS AS NEEDED FOR WHEEZING FOR SHORTNESS OF BREATH   alendronate  (FOSAMAX ) 70 MG tablet TAKE 1 TABLET EVERY WEEK  ON  AN  EMPTY  STOMACH WITH A FULL GLASS OF WATER   atorvastatin  (LIPITOR) 10 MG tablet TAKE 1 TABLET BY MOUTH ONCE DAILY (PLEASE KEEP APPOINTMENT FOR FURTHER REFILLS)   b complex vitamins capsule Take 1 capsule by mouth daily.   Calcium  Citrate-Vitamin D  315-5 MG-MCG TABS    TRELEGY ELLIPTA  100-62.5-25 MCG/ACT AEPB Inhale 1 puff by mouth once daily   Zinc Sulfate (ZINC 15 PO) Take by mouth.   [DISCONTINUED] triamterene -hydrochlorothiazide  (MAXZIDE-25) 37.5-25 MG  tablet Take 1 tablet by mouth daily.   [DISCONTINUED] fluticasone  (FLONASE ) 50 MCG/ACT nasal spray Place 2 sprays into both nostrils daily.   No facility-administered medications prior to visit.    Review of Systems  Last CBC Lab Results  Component Value Date   WBC 5.7 11/13/2022   HGB 15.0 11/13/2022   HCT 45.0 11/13/2022   MCV 95 11/13/2022   MCH 31.6 11/13/2022   RDW 13.6 11/13/2022   PLT 180 11/13/2022   Last metabolic panel Lab Results  Component Value Date   GLUCOSE 94 11/13/2022   NA 138 11/13/2022   K 3.9 11/13/2022   CL 99 11/13/2022   CO2 22 11/13/2022   BUN 6 (L) 11/13/2022   CREATININE 0.81 11/13/2022   EGFR 78 11/13/2022    CALCIUM  10.1 11/13/2022   PHOS 2.3 (L) 07/18/2017   PROT 6.8 11/13/2022   ALBUMIN 4.6 11/13/2022   LABGLOB 2.2 11/13/2022   AGRATIO 2.2 05/12/2021   BILITOT 0.7 11/13/2022   ALKPHOS 81 11/13/2022   AST 31 11/13/2022   ALT 21 11/13/2022   ANIONGAP 12 08/24/2018   Last lipids Lab Results  Component Value Date   CHOL 229 (H) 11/13/2022   HDL 127 11/13/2022   LDLCALC 87 11/13/2022   TRIG 92 11/13/2022   CHOLHDL 1.8 11/13/2022   Last hemoglobin A1c Lab Results  Component Value Date   HGBA1C 5.0 11/13/2022   Last thyroid  functions Lab Results  Component Value Date   TSH 3.000 11/13/2022   Last vitamin D  Lab Results  Component Value Date   VD25OH 34.6 11/13/2022   Last vitamin B12 and Folate No results found for: "VITAMINB12", "FOLATE"      Objective    BP 126/70   Pulse 68   Ht 5\' 8"  (1.727 m)   Wt 133 lb (60.3 kg)   SpO2 96%   BMI 20.22 kg/m  BP Readings from Last 3 Encounters:  06/16/23 126/70  05/08/23 (!) 144/75  11/13/22 (!) 147/83   Wt Readings from Last 3 Encounters:  06/16/23 133 lb (60.3 kg)  05/08/23 140 lb 14 oz (63.9 kg)  11/13/22 140 lb 12.8 oz (63.9 kg)        Physical Exam  General: Alert, no acute distress Cardio: Normal S1 and S2, RRR, no r/m/g Pulm: CTAB, normal work of breathing ABD: soft, abdomen is not distended, there is no tenderness to palpation, normal BS  Extremities: no LE edema    No results found for any visits on 06/16/23.  Assessment & Plan     Problem List Items Addressed This Visit       Cardiovascular and Mediastinum   Aortic atherosclerosis (HCC) - Primary   Aortic atherosclerosis Aortic atherosclerosis is managed with atorvastatin . - Continue atorvastatin  (Lipitor) 10 mg daily.        Respiratory   Centrilobular emphysema (HCC)   Chronic Obstructive Pulmonary Disease (COPD) COPD is well-managed with Trelegy and albuterol . Awaiting scheduling of an updated lung scan by pulmonology. - Continue  Trelegy 100-CC 2.5-25 mcg one puff daily. - Continue albuterol  108 mcg two puffs every six hours as needed. - Follow up with pulmonology to schedule lung scan.      Relevant Medications   nicotine  (NICODERM CQ  - DOSED IN MG/24 HOURS) 14 mg/24hr patch     Musculoskeletal and Integument   Osteoporosis    Osteoporosis without current fracture Chronic Osteoporosis is managed by rheumatology/endocrinology with Fosamax  and calcium /vitamin D  supplementation. DEXA scan is scheduled for  update. - Continue Fosamax  70 mg weekly. - Continue calcium  citrate and vitamin D  315 mg-5 mcg daily. - DEXA scan to be updated as scheduled with endocrinology  -calcium  and vitamin D  levels monitored by specialist         Other   Tobacco use disorder   Tobacco use disorder, chronic In planning stage for smoking cessation She desires to quit smoking. Previously quit for three years using nicotine  patches. Currently smokes nearly a pack daily. - Prescribe 14 mg nicotine  patches for smoking cessation. - Follow up in August to assess progress with smoking cessation.      Relevant Medications   nicotine  (NICODERM CQ  - DOSED IN MG/24 HOURS) 14 mg/24hr patch   Hyperlipidemia   Hyperlipidemia Chronic Hyperlipidemia is managed with atorvastatin . Recent refill issues resolved. - Continue atorvastatin  (Lipitor) 10 mg daily. - continue to follow diet that is low in saturated fats,encouraged regular aerobic exercise aiming for 150 mins per week       Depression, recurrent (HCC)     Assessment & Plan   Weight management Reports unintentional weight loss and desires weight gain. Current BMI is 20.22, within a healthy range. Advised to increase protein intake and consider protein supplements. - Increase dietary protein intake. - Consider protein supplements such as Boost. - Monitor weight and BMI.  General Health Maintenance Due for updated DEXA scan and Shingrix vaccine. Annual wellness visit scheduled  for October 1st. - Update DEXA scan as scheduled. - Administer Shingrix vaccine. - Annual wellness visit scheduled for October 1st.  Return in about 3 months (around 09/16/2023) for smoking cessation.       Mimi Alt, MD  [facility] [phone] (phone) [phone] (fax)  [facility]

## 2023-06-16 NOTE — Telephone Encounter (Signed)
  Chief Complaint: vaginal discharge and itching Symptoms: white, thin vaginal discharge; vaginal itching Frequency: x several weeks Pertinent Negatives: Patient denies blisters, sores, bumps, abdominal pain, fever, vaginal bleeding. Disposition: [] ED /[] Urgent Care (no appt availability in office) / [] Appointment(In office/virtual)/ []  [facility]/ [] Home Care/ [] Refused Recommended Disposition /[] [facility]/ [x]  Follow-up with PCP Additional Notes: Patient states she forgot to ask her PCP about some symptoms/questions. She states she has vaginal discharge and thinks she has a vaginal infection. She states she has had a yeast infection in the past and this is the same thing, "I know it". Patient states she can not afford over the counter home care treatment and is asking if PCP will prescribe her something instead.  Copied from CRM [id number]. Topic: Clinical - Medical Advice >> Jun 16, 2023  3:35 PM Adaline Holly wrote: Patient would like a callback to discuss some follow up questions she has from today's visit with Dr. Verdia Glad Reason for Disposition  Symptoms of a vaginal yeast infection (i.e., white, thick, cottage-cheese-like, itchy, not bad smelling discharge)  Answer Assessment - Initial Assessment Questions 1. DISCHARGE: "Describe the discharge." (e.g., white, yellow, green, gray, foamy, cottage cheese-like)     White, thin.  2. ODOR: "Is there a bad odor?"     Denies.  3. ONSET: "When did the discharge begin?"     Several weeks ago.  4. RASH: "Is there a rash in the genital area?" If Yes, ask: "Describe it." (e.g., redness, blisters, sores, bumps)     Denies.  5. ABDOMEN PAIN: "Are you having any abdomen pain?" If Yes, ask: "What does it feel like? " (e.g., crampy, dull, intermittent, constant)      Denies.  6. ABDOMEN PAIN SEVERITY: If present, ask: "How bad is it?" (e.g., Scale 1-10; mild, moderate, or severe)   - MILD (1-3): Doesn't interfere with normal  activities, abdomen soft and not tender to touch.    - MODERATE (4-7): Interferes with normal activities or awakens from sleep, abdomen tender to touch.    - SEVERE (8-10): Excruciating pain, doubled over, unable to do any normal activities. (R/O peritonitis)      0/10.  7. CAUSE: "What do you think is causing the discharge?" "Have you had the same problem before? What happened then?"     She states she knows this is a yeast infection.  8. OTHER SYMPTOMS: "Do you have any other symptoms?" (e.g., fever, itching, vaginal bleeding, pain with urination, injury to genital area, vaginal foreign body)     Itching.  9. PREGNANCY: "Is there any chance you are pregnant?" "When was your last menstrual period?"     N/A.  Protocols used: Vaginal Discharge-A-AH

## 2023-06-16 NOTE — Assessment & Plan Note (Signed)
 Hyperlipidemia Chronic Hyperlipidemia is managed with atorvastatin . Recent refill issues resolved. - Continue atorvastatin  (Lipitor) 10 mg daily. - continue to follow diet that is low in saturated fats,encouraged regular aerobic exercise aiming for 150 mins per week

## 2023-06-16 NOTE — Assessment & Plan Note (Signed)
 Tobacco use disorder, chronic In planning stage for smoking cessation She desires to quit smoking. Previously quit for three years using nicotine  patches. Currently smokes nearly a pack daily. - Prescribe 14 mg nicotine  patches for smoking cessation. - Follow up in August to assess progress with smoking cessation.

## 2023-06-17 NOTE — Telephone Encounter (Signed)
 Called patient and left message letting her know that prescription has been sent in.

## 2023-07-19 ENCOUNTER — Telehealth: Payer: Self-pay

## 2023-07-19 NOTE — Telephone Encounter (Signed)
 Copied from CRM [id number]. Topic: Clinical - Medical Advice >> Jul 16, 2023  3:20 PM Montie POUR wrote: Reason for CRM:  Navia wants to talk to Dr. Feliberto nurse about going to bathroom a lot; pain after she goes; can't hold it

## 2023-07-19 NOTE — Telephone Encounter (Signed)
 LMTCB-patient needs to be seen for UTI symptoms

## 2023-07-21 ENCOUNTER — Telehealth: Admitting: Physician Assistant

## 2023-07-21 ENCOUNTER — Encounter: Payer: Self-pay | Admitting: Physician Assistant

## 2023-07-21 DIAGNOSIS — N390 Urinary tract infection, site not specified: Secondary | ICD-10-CM | POA: Diagnosis not present

## 2023-07-21 MED ORDER — CEPHALEXIN 500 MG PO CAPS: 500.0000 mg | ORAL_CAPSULE | Freq: Two times a day (BID) | ORAL | 0 refills | Status: AC

## 2023-07-21 NOTE — Telephone Encounter (Signed)
 Contacted patient to schedule same day visit if possible. No openings available. Walked patient through how to scheduled UC-Virtual visit. She verbalized understanding. Appt is scheduled for 4:30

## 2023-07-21 NOTE — Patient Instructions (Signed)
  Jennifer [name], thank you for joining [name], PA-C for today's virtual visit.  While this provider is not your primary care provider (PCP), if your PCP is located in our provider database this encounter information will be shared with them immediately following your visit.   A [facility] MyChart account gives you access to today's visit and all your visits, tests, and labs performed at [facility]  click here if you don't have a [facility] MyChart account or go to [email or web address]  Consent: (Patient) Jennifer [name] provided verbal consent for this virtual visit at the beginning of the encounter.  Current Medications:  Current Outpatient Medications:    albuterol  (VENTOLIN  HFA) 108 (90 Base) MCG/ACT inhaler, INHALE 2 PUFFS BY MOUTH EVERY 6 HOURS AS NEEDED FOR WHEEZING FOR SHORTNESS OF BREATH, Disp: 9 g, Rfl: 0   alendronate  (FOSAMAX ) 70 MG tablet, TAKE 1 TABLET EVERY WEEK  ON  AN  EMPTY  STOMACH WITH A FULL GLASS OF WATER, Disp: 12 tablet, Rfl: 3   atorvastatin  (LIPITOR) 10 MG tablet, TAKE 1 TABLET BY MOUTH ONCE DAILY (PLEASE KEEP APPOINTMENT FOR FURTHER REFILLS), Disp: 90 tablet, Rfl: 0   b complex vitamins capsule, Take 1 capsule by mouth daily., Disp: , Rfl:    Calcium  Citrate-Vitamin D  315-5 MG-MCG TABS, , Disp: , Rfl:    fluconazole  (DIFLUCAN ) 150 MG tablet, Take one 150 mg tablet by mouth at onset of symptoms and repeat after 72 hours if symptoms persist, Disp: 2 tablet, Rfl: 0   nicotine  (NICODERM CQ  - DOSED IN MG/24 HOURS) 14 mg/24hr patch, Place 1 patch (14 mg total) onto the skin daily., Disp: 28 patch, Rfl: 11   TRELEGY ELLIPTA  100-62.5-25 MCG/ACT AEPB, Inhale 1 puff by mouth once daily, Disp: 180 each, Rfl: 0   Zinc Sulfate (ZINC 15 PO), Take by mouth., Disp: , Rfl:    Medications ordered in this encounter:  No orders of the defined types were placed in this encounter.    *If you need refills on other medications prior to your next appointment,  please contact your pharmacy*  Follow-Up: Call back or seek an in-person evaluation if the symptoms worsen or if the condition fails to improve as anticipated.  [facility] [phone]  Other Instructions You were given a prescription for antibiotics. Please take the antibiotic prescription fully.   Follow up with your regular doctor in 1 week for reassessment and seek care sooner if your symptoms worsen or fail to improve.    If you have been instructed to have an in-person evaluation today at a local Urgent Care facility, please use the link below. It will take you to a list of all of our available [facility], including address, phone number and hours of operation. Please do not delay care.  [facility]  If you or a family member do not have a primary care provider, use the link below to schedule a visit and establish care. When you choose a [facility] primary care physician or advanced practice provider, you gain a long-term partner in health. Find a Primary Care Provider  Learn more about [facility]'s in-office and virtual care options:  - Get Care Now

## 2023-07-21 NOTE — Progress Notes (Signed)
 Jennifer [name] are scheduled for a virtual visit with your provider today.    Just as we do with appointments in the office, we must obtain your consent to participate.  Your consent will be active for this visit and any virtual visit you may have with one of our providers in the next 365 days.    If you have a MyChart account, I can also send a copy of this consent to you electronically.  All virtual visits are billed to your insurance company just like a traditional visit in the office.  As this is a virtual visit, video technology does not allow for your provider to perform a traditional examination.  This may limit your provider's ability to fully assess your condition.  If your provider identifies any concerns that need to be evaluated in person or the need to arrange testing such as labs, EKG, etc, we will make arrangements to do so.    Although advances in technology are sophisticated, we cannot ensure that it will always work on either your end or our end.  If the connection with a video visit is poor, we may have to switch to a telephone visit.  With either a video or telephone visit, we are not always able to ensure that we have a secure connection.   I need to obtain your verbal consent now.   Are you willing to proceed with your visit today?   Jennifer [name] has provided verbal consent on 07/21/2023 for a virtual visit (video or telephone).   Jennifer [name], [place] 07/21/2023  4:35 PM   Date:  07/21/2023   ID:  Jennifer [name], DOB 1950-09-05, MRN [id number]  Patient Location: Home Provider Location: Home Office   Participants: Patient and Provider for Visit and Wrap up  Method of visit: Video  Location of Patient: Home Location of Provider: Home Office Consent was obtain for visit over the video. Services rendered by provider: Visit was performed via video  A video enabled telemedicine application was used and I verified that I am speaking with the correct person using two  identifiers.  PCP:  Sharma Coyer, MD   Chief Complaint:  dysuria  History of Present Illness:    Jennifer [name] is a 73 y.o. female with history as stated below. Presents video telehealth for an acute care visit  Pt reports that for the last week she has had dysuria, frequency, urgency, urinary incontinence.  Denies fevers, hematuria, abd pain, NV, or flank pain.  She reports hx previous uti and this feels similar  Past Medical, Surgical, Social History, Allergies, and Medications have been Reviewed.  Past Medical History:  Diagnosis Date   Arthritis    mild   Cancer (HCC)    skin cancer leg and chest - Dr. Jakie, and [facility]   COPD (chronic obstructive pulmonary disease) (HCC)    Dysrhythmia    palpitation   Emphysema of lung (HCC)    Family history of colon cancer    Headache    as  a child    Heart murmur    while pregnant   Hyperlipidemia 07/17/2017   Osteoporosis    Sleep apnea    Tobacco abuse     Current Meds  Medication Sig   cephALEXin (KEFLEX) 500 MG capsule Take 1 capsule (500 mg total) by mouth 2 (two) times daily for 7 days.     Allergies:   Patient has no known allergies.   ROS See HPI  for history of present illness.  Physical Exam Constitutional:      Appearance: Normal appearance.  Pulmonary:     Effort: Pulmonary effort is normal.   Neurological:     Mental Status: She is alert.              MDM: Pt with uti sxs, no sxs of pyelonephritis. Rx for abx sent. Advised on plan for follow up   Tests Ordered: No orders of the defined types were placed in this encounter.   Medication Changes: Meds ordered this encounter  Medications   cephALEXin (KEFLEX) 500 MG capsule    Sig: Take 1 capsule (500 mg total) by mouth 2 (two) times daily for 7 days.    Dispense:  14 capsule    Refill:  0     Disposition:  Follow up  Signed, Jennifer GORMAN Snuffer, PA-C  07/21/2023 4:35 PM

## 2023-07-21 NOTE — Telephone Encounter (Signed)
 Copied from CRM [id number]. Topic: Appointments - Scheduling Inquiry for Clinic >> Jul 21, 2023 10:48 AM Cristopher B wrote: Reason for CRM: pt called in to speak with Dr.Simmons nurse . She said she missed a call from her regarding getting an appt for her UTI symptoms . She would like a call back if possible .

## 2023-08-10 ENCOUNTER — Ambulatory Visit: Payer: Self-pay

## 2023-08-10 NOTE — Telephone Encounter (Signed)
 FYI Only or Action Required?: FYI only for provider.  Patient was last seen in primary care on 06/16/2023 by Sharma Coyer, MD.  PAS agent Called Nurse Triage reporting Advice Only.  Symptoms began today.     PAS called to advice for some advice from NT.  PAS stated pt expressed mental issues, she needs to get out of the home.  Agent stated Female in background heard by agent, speaking loud, yelling, harsh to patient.  Somehow the line was disconnected therefore agent needed advice on how to handle.  Agent stated she offered patient to start a referral .Nurse instructed agent to attempt to call the patient back and if no successful route CRM to NT .  Nurse informed agent not to suggest a referral because it sounds like the patient may need to talk to someone now: if you get the patient on the line transfer the call to NT.  Nurse attempted to contact patient: no answer: left voicemail.   Nurse then called 911 to request a safety check for patient due to mental issues and possible unsafe environment.

## 2023-08-11 ENCOUNTER — Telehealth: Payer: Self-pay

## 2023-08-11 DIAGNOSIS — F339 Major depressive disorder, recurrent, unspecified: Secondary | ICD-10-CM

## 2023-08-11 NOTE — Telephone Encounter (Signed)
 Urgent psychiatry referral submitted for depression

## 2023-08-11 NOTE — Addendum Note (Signed)
 Addended by: VONA LYNDEN RAMAN on: 08/11/2023 04:14 PM   Modules accepted: Level of Service

## 2023-08-11 NOTE — Telephone Encounter (Signed)
Psychiatry referral submitted

## 2023-08-11 NOTE — Telephone Encounter (Signed)
 Copied from CRM [id number]. Topic: Referral - Request for Referral >> Aug 10, 2023  5:24 PM Tiffini S wrote: Did the patient discuss referral with their provider in the last year? No (If No - schedule appointment) (If Yes - send message)  Appointment offered? Yes, offered appointment with PCP  Type of order/referral and detailed reason for visit: Mental Health   Preference of office, provider, location: In Person   If referral order, have you been seen by this specialty before? Yes, patient do not remember  (If Yes, this issue or another issue? When? Where?  Can we respond through MyChart? No    Referral pended, will patient need appointment

## 2023-09-11 ENCOUNTER — Other Ambulatory Visit: Payer: Self-pay | Admitting: Family Medicine

## 2023-09-11 DIAGNOSIS — E7800 Pure hypercholesterolemia, unspecified: Secondary | ICD-10-CM

## 2023-09-16 ENCOUNTER — Ambulatory Visit: Admitting: Family Medicine

## 2023-10-27 ENCOUNTER — Ambulatory Visit: Admitting: Emergency Medicine

## 2023-10-27 ENCOUNTER — Ambulatory Visit: Admitting: Family Medicine

## 2023-10-27 VITALS — Ht 68.5 in | Wt 130.0 lb

## 2023-10-27 DIAGNOSIS — Z0000 Encounter for general adult medical examination without abnormal findings: Secondary | ICD-10-CM | POA: Diagnosis not present

## 2023-10-27 DIAGNOSIS — Z1231 Encounter for screening mammogram for malignant neoplasm of breast: Secondary | ICD-10-CM

## 2023-10-27 NOTE — Progress Notes (Signed)
 Subjective:   Jennifer [name] is a 73 y.o. who presents for a Medicare Wellness preventive visit.  As a reminder, Annual Wellness Visits don't include a physical exam, and some assessments may be limited, especially if this visit is performed virtually. We may recommend an in-person follow-up visit with your provider if needed.  Visit Complete: Virtual I connected with  Jennifer [name] on 10/27/23 by a audio enabled telemedicine application and verified that I am speaking with the correct person using two identifiers.  Patient Location: Home  Provider Location: Home Office  I discussed the limitations of evaluation and management by telemedicine. The patient expressed understanding and agreed to proceed.  Vital Signs: Because this visit was a virtual/telehealth visit, some criteria may be missing or patient reported. Any vitals not documented were not able to be obtained and vitals that have been documented are patient reported.  VideoDeclined- This patient declined Librarian, academic. Therefore the visit was completed with audio only.  Persons Participating in Visit: Patient.  AWV Questionnaire: Yes: Patient Medicare AWV questionnaire was completed by the patient on 10/24/23; I have confirmed that all information answered by patient is correct and no changes since this date.  Cardiac Risk Factors include: advanced age (>57men, >74 women);dyslipidemia;smoking/ tobacco exposure     Objective:    Today's Vitals   10/27/23 1131  Weight: 130 lb (59 kg)  Height: 5' 8.5 (1.74 m)   Body mass index is 19.48 kg/m.     10/27/2023   11:41 AM 05/08/2023    4:58 PM 07/15/2022    1:54 PM 07/08/2021   11:55 AM 03/04/2020    9:58 AM 09/14/2019    3:54 PM 08/24/2018    8:32 PM  Advanced Directives  Does Patient Have a Medical Advance Directive? No No No No No No No  Would patient like information on creating a medical advance directive? Yes (MAU/Ambulatory/Procedural  Areas - Information given) No - Patient declined  No - Patient declined  No - Patient declined No - Patient declined      Data saved with a previous flowsheet row definition    Current Medications (verified) Outpatient Encounter Medications as of 10/27/2023  Medication Sig   albuterol  (VENTOLIN  HFA) 108 (90 Base) MCG/ACT inhaler INHALE 2 PUFFS BY MOUTH EVERY 6 HOURS AS NEEDED FOR WHEEZING FOR SHORTNESS OF BREATH   alendronate  (FOSAMAX ) 70 MG tablet TAKE 1 TABLET EVERY WEEK  ON  AN  EMPTY  STOMACH WITH A FULL GLASS OF WATER   atorvastatin  (LIPITOR) 10 MG tablet TAKE 1 TABLET BY MOUTH ONCE DAILY. MUST KEEP APPOINTMENT FOR FURTHER REFILLS.   b complex vitamins capsule Take 1 capsule by mouth daily.   Calcium  Citrate-Vitamin D  315-5 MG-MCG TABS    TRELEGY ELLIPTA  100-62.5-25 MCG/ACT AEPB Inhale 1 puff by mouth once daily   Zinc Sulfate (ZINC 15 PO) Take by mouth.   fluconazole  (DIFLUCAN ) 150 MG tablet Take one 150 mg tablet by mouth at onset of symptoms and repeat after 72 hours if symptoms persist (Patient not taking: Reported on 10/27/2023)   nicotine  (NICODERM CQ  - DOSED IN MG/24 HOURS) 14 mg/24hr patch Place 1 patch (14 mg total) onto the skin daily. (Patient not taking: Reported on 10/27/2023)   No facility-administered encounter medications on file as of 10/27/2023.    Allergies (verified) Patient has no known allergies.   History: Past Medical History:  Diagnosis Date   Arthritis    mild   Cancer (HCC)  skin cancer leg and chest - Dr. Jakie, and [facility]   COPD (chronic obstructive pulmonary disease) [facility])    Dysrhythmia    palpitation   Emphysema of lung (HCC)    Family history of colon cancer    Headache    as  a child    Heart murmur    while pregnant   Hyperlipidemia 07/17/2017   Osteoporosis    Sleep apnea    Tobacco abuse    Past Surgical History:  Procedure Laterality Date   ABDOMINAL HYSTERECTOMY     1996   COLONOSCOPY WITH PROPOFOL  N/A 03/29/2017    Procedure: COLONOSCOPY WITH PROPOFOL ;  Surgeon: Unk Corinn Skiff, MD;  Location: [facility] ENDOSCOPY;  Service: Gastroenterology;  Laterality: N/A;   COLONOSCOPY WITH PROPOFOL  N/A 03/04/2020   Procedure: COLONOSCOPY WITH PROPOFOL ;  Surgeon: Unk Corinn Skiff, MD;  Location: [facility] ENDOSCOPY;  Service: Gastroenterology;  Laterality: N/A;   HEMICOLECTOMY     righ  [id number]   LAPAROSCOPIC RIGHT HEMI COLECTOMY Right 07/16/2017   Procedure: LAPAROSCOPIC RIGHT HEMI COLECTOMY;  Surgeon: Teresa Lonni HERO, MD;  Location: [facility] ORS;  Service: General;  Laterality: Right;   TUBAL LIGATION     Family History  Problem Relation Age of Onset   Diabetes Mother 80       12/2020   Kidney disease Mother    Arthritis Mother    COPD Mother    Heart attack Father    Colon cancer Paternal Aunt 25   Colon cancer Paternal Uncle        dx >50, colon completely removed   Diabetes Maternal Grandmother    Colon cancer Maternal Grandfather    Heart disease Paternal Grandmother    Breast cancer Neg Hx    Social History   Socioeconomic History   Marital status: Divorced    Spouse name: Not on file   Number of children: 1   Years of education: Not on file   Highest education level: GED or equivalent  Occupational History   Occupation: retired  Tobacco Use   Smoking status: Every Day    Current packs/day: 1.00    Average packs/day: 1 pack/day for 43.7 years (43.7 ttl pk-yrs)    Types: Cigarettes, Cigars    Start date: 1982   Smokeless tobacco: Never  Vaping Use   Vaping status: Never Used  Substance and Sexual Activity   Alcohol use: Not Currently    Comment: one pint of vodka daily, last use 09/2023   Drug use: No   Sexual activity: Not Currently  Other Topics Concern   Not on file  Social History Narrative   10-30-23 son deceased 2000/pbt   Social Drivers of Health   Financial Resource Strain: Low Risk  (10/30/23)   Overall Financial Resource Strain (CARDIA)    Difficulty of Paying Living  Expenses: Not hard at all  Food Insecurity: No Food Insecurity (2023/10/30)   Hunger Vital Sign    Worried About Running Out of Food in the Last Year: Never true    Ran Out of Food in the Last Year: Never true  Transportation Needs: No Transportation Needs (2023-10-30)   PRAPARE - Administrator, Civil Service (Medical): No    Lack of Transportation (Non-Medical): No  Physical Activity: Insufficiently Active (10/30/2023)   Exercise Vital Sign    Days of Exercise per Week: 3 days    Minutes of Exercise per Session: 30 min  Stress: Stress Concern Present (10-30-2023)   [place]  [employer] - Occupational Stress Questionnaire    Feeling of Stress: To some extent  Social Connections: Moderately Isolated (10/27/2023)   Social Connection and Isolation Panel    Frequency of Communication with Friends and Family: More than three times a week    Frequency of Social Gatherings with Friends and Family: More than three times a week    Attends Religious Services: 1 to 4 times per year    Active Member of [employer] or Organizations: No    Attends Engineer, structural: Never    Marital Status: Divorced    Tobacco Counseling Ready to quit: Not Answered Counseling given: Not Answered    Clinical Intake:  Pre-visit preparation completed: Yes  Pain : No/denies pain     BMI - recorded: 19.48 Nutritional Status: BMI of 19-24  Normal Nutritional Risks: None Diabetes: No  Lab Results  Component Value Date   HGBA1C 5.0 11/13/2022   HGBA1C 5.0 07/13/2017     How often do you need to have someone help you when you read instructions, pamphlets, or other written materials from your doctor or pharmacy?: 1 - Never  Interpreter Needed?: No  Information entered by :: Vina Ned, CMA   Activities of Daily Living     10/27/2023   11:32 AM 10/24/2023    7:21 AM  In your present state of health, do you have any difficulty performing the following activities:   Hearing? 0 0  Vision? 0 0  Difficulty concentrating or making decisions? 0 0  Walking or climbing stairs? 0 0  Dressing or bathing? 0 0  Doing errands, shopping? 0 0  Preparing Food and eating ? N N  Using the Toilet? N N  In the past six months, have you accidently leaked urine? N Y  Do you have problems with loss of bowel control? N N  Managing your Medications? N N  Managing your Finances? N N  Housekeeping or managing your Housekeeping? N N    Patient Care Team: Sharma Coyer, MD as PCP - General (Family Medicine) Dingeldein, Elspeth, MD (Ophthalmology) Damian Therisa HERO, MD as Physician Assistant (Endocrinology)  I have updated your Care Teams any recent Medical Services you may have received from other providers in the past year.     Assessment:   This is a routine wellness examination for Jennifer [name].  Hearing/Vision screen Hearing Screening - Comments:: Denies hearing loss  Vision Screening - Comments:: Gets routine eye exams, Dr. Dingeldein, [facility]   Goals Addressed               This Visit's Progress     Increase physical activity (pt-stated)         Depression Screen     10/27/2023   11:38 AM 06/16/2023    1:43 PM 11/13/2022    1:13 PM 08/28/2022    3:24 PM 07/15/2022    1:51 PM 06/10/2022    2:33 PM 05/14/2022    1:45 PM  PHQ 2/9 Scores  PHQ - 2 Score 1 0 0 0 0 0 1  PHQ- 9 Score 3 1 1   5 4     Fall Risk     10/27/2023   11:43 AM 10/24/2023    7:21 AM 08/28/2022    3:24 PM 07/11/2022    9:35 AM 06/10/2022    2:33 PM  Fall Risk   Falls in the past year? 0 1 0 0 0  Number falls in past yr: 0 0  0 0  Injury with Fall? 0 0 1 0 0  Risk for fall due to : No Fall Risks  History of fall(s);No Fall Risks No Fall Risks   Follow up Falls evaluation completed  Falls evaluation completed Falls prevention discussed;Education provided     MEDICARE RISK AT HOME:  Medicare Risk at Home Any stairs in or around the home?: Yes If so, are there any  without handrails?: No Home free of loose throw rugs in walkways, pet beds, electrical cords, etc?: No Adequate lighting in your home to reduce risk of falls?: Yes Life alert?: No Use of a cane, walker or w/c?: No Grab bars in the bathroom?: Yes Shower chair or bench in shower?: No Elevated toilet seat or a handicapped toilet?: No  TIMED UP AND GO:  Was the test performed?  No  Cognitive Function: 6CIT completed        10/27/2023   11:44 AM 07/15/2022    1:56 PM 07/08/2021   11:59 AM  6CIT Screen  What Year? 0 points 0 points 0 points  What month? 0 points 0 points 0 points  What time? 0 points 0 points 0 points  Count back from 20 0 points 0 points 0 points  Months in reverse 0 points 0 points 0 points  Repeat phrase 0 points 0 points 0 points  Total Score 0 points 0 points 0 points    Immunizations Immunization History  Administered Date(s) Administered   Fluad Quad(high Dose 65+) 01/25/2019, 01/31/2020, 11/19/2020, 11/12/2021   INFLUENZA, HIGH DOSE SEASONAL PF 11/02/2017   Influenza-Unspecified 01/25/2019   Moderna Covid-19 Vaccine Bivalent Booster 86yrs & up 12/12/2020   Moderna Sars-Covid-2 Vaccination 02/14/2019, 03/14/2019, 12/04/2019   Pneumococcal Conjugate-13 03/25/2017   Pneumococcal Polysaccharide-23 01/25/2019    Screening Tests Health Maintenance  Topic Date Due   Zoster Vaccines- Shingrix (1 of 2) Never done   DEXA SCAN  03/11/2022   Lung Cancer Screening  04/29/2023   COVID-19 Vaccine (5 - 2025-26 season) 09/27/2023   Mammogram  11/19/2023   Influenza Vaccine  04/25/2024 (Originally 08/27/2023)   Medicare Annual Wellness (AWV)  10/26/2024   Colonoscopy  03/04/2025   Pneumococcal Vaccine: 50+ Years  Completed   Hepatitis C Screening  Completed   HPV VACCINES  Aged Out   Meningococcal B Vaccine  Aged Out   DTaP/Tdap/Td  Discontinued    Health Maintenance Items Addressed: Mammogram ordered, See Nurse Notes at the end of this note  Additional  Screening:  Vision Screening: Recommended annual ophthalmology exams for early detection of glaucoma and other disorders of the eye. Is the patient up to date with their annual eye exam?  Yes  Who is the provider or what is the name of the office in which the patient attends annual eye exams? Dr. Dingeldein @ [facility] [place]  Dental Screening: Recommended annual dental exams for proper oral hygiene  Community Resource Referral / Chronic Care Management: CRR required this visit?  No   CCM required this visit?  No   Plan:    I have personally reviewed and noted the following in the patient's chart:   Medical and social history Use of alcohol, tobacco or illicit drugs  Current medications and supplements including opioid prescriptions. Patient is not currently taking opioid prescriptions. Functional ability and status Nutritional status Physical activity Advanced directives List of other physicians Hospitalizations, surgeries, and ER visits in previous 12 months Vitals Screenings to include cognitive, depression, and falls Referrals and appointments  In addition, I have reviewed and discussed with patient certain preventive protocols, quality metrics, and best practice recommendations. A written personalized care plan for preventive services as well as general preventive health recommendations were provided to patient.   Vina Ned, CMA   10/27/2023   After Visit Summary: (MyChart) Due to this being a telephonic visit, the after visit summary with patients personalized plan was offered to patient via MyChart   Notes:  Placed order for MMG (due ~11/19/23) DEXA UTD per patient (followed by Endocrinology) Gave ph# to call and schedule LDCT Scan (order placed 03/19/23, was due 04/29/23) Declined flu, shingles and Tdap vaccines

## 2023-10-27 NOTE — Patient Instructions (Signed)
 Jennifer [name],  Thank you for taking the time for your Medicare Wellness Visit. I appreciate your continued commitment to your health goals. Please review the care plan we discussed, and feel free to reach out if I can assist you further.  Medicare recommends these wellness visits once per year to help you and your care team stay ahead of potential health issues. These visits are designed to focus on prevention, allowing your provider to concentrate on managing your acute and chronic conditions during your regular appointments.  Please note that Annual Wellness Visits do not include a physical exam. Some assessments may be limited, especially if the visit was conducted virtually. If needed, we may recommend a separate in-person follow-up with your provider.  Ongoing Care Seeing your primary care provider every 3 to 6 months helps us  monitor your health and provide consistent, personalized care.   Referrals If a referral was made during today's visit and you haven't received any updates within two weeks, please contact the referred provider directly to check on the status.  Recommended Screenings:  Please call to schedule your mammogram (due 11/20/23):  [facility] at [facility] Address: [address], [place], [place] Phone: [phone]  Call [facility] @ [phone] to schedule a low dose lung CT scan to screen for lung cancer.   Health Maintenance  Topic Date Due   Zoster (Shingles) Vaccine (1 of 2) Never done   DEXA scan (bone density measurement)  03/11/2022   Screening for Lung Cancer  04/29/2023   COVID-19 Vaccine (5 - 2025-26 season) 09/27/2023   Breast Cancer Screening  11/19/2023   Flu Shot  04/25/2024*   Medicare Annual Wellness Visit  10/26/2024   Colon Cancer Screening  03/04/2025   Pneumococcal Vaccine for age over 35  Completed   Hepatitis C Screening  Completed   HPV Vaccine  Aged Out   Meningitis B Vaccine  Aged Out   DTaP/Tdap/Td  vaccine  Discontinued  *Topic was postponed. The date shown is not the original due date.       10/27/2023   11:41 AM  Advanced Directives  Does Patient Have a Medical Advance Directive? No  Would patient like information on creating a medical advance directive? Yes (MAU/Ambulatory/Procedural Areas - Information given)   Advance Care Planning is important because it: Ensures you receive medical care that aligns with your values, goals, and preferences. Provides guidance to your family and loved ones, reducing the emotional burden of decision-making during critical moments.  Vision: Annual vision screenings are recommended for early detection of glaucoma, cataracts, and diabetic retinopathy. These exams can also reveal signs of chronic conditions such as diabetes and high blood pressure.  Dental: Annual dental screenings help detect early signs of oral cancer, gum disease, and other conditions linked to overall health, including heart disease and diabetes.  Please see the attached documents for additional preventive care recommendations.   Fall Prevention in the Home, Adult Falls can cause injuries and affect people of all ages. There are many simple things that you can do to make your home safe and to help prevent falls. If you need it, ask for help making these changes. What actions can I take to prevent falls? General information Use good lighting in all rooms. Make sure to: Replace any light bulbs that burn out. Turn on lights if it is dark and use night-lights. Keep items that you use often in easy-to-reach places. Lower the shelves around your home if needed.  Move furniture so that there are clear paths around it. Do not keep throw rugs or other things on the floor that can make you trip. If any of your floors are uneven, fix them. Add color or contrast paint or tape to clearly mark and help you see: Grab bars or handrails. First and last steps of staircases. Where the edge of  each step is. If you use a ladder or stepladder: Make sure that it is fully opened. Do not climb a closed ladder. Make sure the sides of the ladder are locked in place. Have someone hold the ladder while you use it. Know where your pets are as you move through your home. What can I do in the bathroom?     Keep the floor dry. Clean up any water that is on the floor right away. Remove soap buildup in the bathtub or shower. Buildup makes bathtubs and showers slippery. Use non-skid mats or decals on the floor of the bathtub or shower. Attach bath mats securely with double-sided, non-slip rug tape. If you need to sit down while you are in the shower, use a non-slip stool. Install grab bars by the toilet and in the bathtub and shower. Do not use towel bars as grab bars. What can I do in the bedroom? Make sure that you have a light by your bed that is easy to reach. Do not use any sheets or blankets on your bed that hang to the floor. Have a firm bench or chair with side arms that you can use for support when you get dressed. What can I do in the kitchen? Clean up any spills right away. If you need to reach something above you, use a sturdy step stool that has a grab bar. Keep electrical cables out of the way. Do not use floor polish or wax that makes floors slippery. What can I do with my stairs? Do not leave anything on the stairs. Make sure that you have a light switch at the top and the bottom of the stairs. Have them installed if you do not have them. Make sure that there are handrails on both sides of the stairs. Fix handrails that are broken or loose. Make sure that handrails are as long as the staircases. Install non-slip stair treads on all stairs in your home if they do not have carpet. Avoid having throw rugs at the top or bottom of stairs, or secure the rugs with carpet tape to prevent them from moving. Choose a carpet design that does not hide the edge of steps on the stairs.  Make sure that carpet is firmly attached to the stairs. Fix any carpet that is loose or worn. What can I do on the outside of my home? Use bright outdoor lighting. Repair the edges of walkways and driveways and fix any cracks. Clear paths of anything that can make you trip, such as tools or rocks. Add color or contrast paint or tape to clearly mark and help you see high doorway thresholds. Trim any bushes or trees on the main path into your home. Check that handrails are securely fastened and in good repair. Both sides of all steps should have handrails. Install guardrails along the edges of any raised decks or porches. Have leaves, snow, and ice cleared regularly. Use sand, salt, or ice melt on walkways during winter months if you live where there is ice and snow. In the garage, clean up any spills right away, including grease or oil  spills. What other actions can I take? Review your medicines with your health care provider. Some medicines can make you confused or feel dizzy. This can increase your chance of falling. Wear closed-toe shoes that fit well and support your feet. Wear shoes that have rubber soles and low heels. Use a cane, walker, scooter, or crutches that help you move around if needed. Talk with your provider about other ways that you can decrease your risk of falls. This may include seeing a physical therapist to learn to do exercises to improve movement and strength. Where to find more information Centers for Disease Control and Prevention, [employer]: TonerPromos.no [employer] on Aging: BaseRingTones.pl National Institute on Aging: BaseRingTones.pl Contact a health care provider if: You are afraid of falling at home. You feel weak, drowsy, or dizzy at home. You fall at home. Get help right away if you: Lose consciousness or have trouble moving after a fall. Have a fall that causes a head injury. These symptoms may be an emergency. Get help right away. Call 911. Do not wait to see if  the symptoms will go away. Do not drive yourself to the hospital. This information is not intended to replace advice given to you by your health care provider. Make sure you discuss any questions you have with your health care provider. Document Revised: 09/15/2021 Document Reviewed: 09/15/2021 Elsevier Patient Education  2024 [employer].

## 2023-10-29 ENCOUNTER — Ambulatory Visit: Admitting: Family Medicine

## 2023-10-29 ENCOUNTER — Encounter: Payer: Self-pay | Admitting: Family Medicine

## 2023-10-29 VITALS — BP 138/82 | HR 101 | Temp 98.6°F | Ht 68.5 in | Wt 122.3 lb

## 2023-10-29 DIAGNOSIS — F17200 Nicotine dependence, unspecified, uncomplicated: Secondary | ICD-10-CM | POA: Diagnosis not present

## 2023-10-29 DIAGNOSIS — I700 Atherosclerosis of aorta: Secondary | ICD-10-CM

## 2023-10-29 DIAGNOSIS — F17210 Nicotine dependence, cigarettes, uncomplicated: Secondary | ICD-10-CM

## 2023-10-29 DIAGNOSIS — H5789 Other specified disorders of eye and adnexa: Secondary | ICD-10-CM

## 2023-10-29 DIAGNOSIS — R030 Elevated blood-pressure reading, without diagnosis of hypertension: Secondary | ICD-10-CM

## 2023-10-29 DIAGNOSIS — F1021 Alcohol dependence, in remission: Secondary | ICD-10-CM

## 2023-10-29 DIAGNOSIS — J432 Centrilobular emphysema: Secondary | ICD-10-CM | POA: Diagnosis not present

## 2023-10-29 DIAGNOSIS — M810 Age-related osteoporosis without current pathological fracture: Secondary | ICD-10-CM

## 2023-10-29 DIAGNOSIS — E7800 Pure hypercholesterolemia, unspecified: Secondary | ICD-10-CM

## 2023-10-29 DIAGNOSIS — F339 Major depressive disorder, recurrent, unspecified: Secondary | ICD-10-CM

## 2023-10-29 MED ORDER — POLYMYXIN B-TRIMETHOPRIM 10000-0.1 UNIT/ML-% OP SOLN: 2.0000 [drp] | Freq: Four times a day (QID) | OPHTHALMIC | 0 refills | Status: AC

## 2023-10-29 NOTE — Progress Notes (Signed)
 Established patient visit   Patient: Jennifer [name]   DOB: [birth year]   73 y.o. Female  MRN: [id number] Visit Date: 10/29/2023  Today's healthcare provider: Rockie Agent, MD   Chief Complaint  Patient presents with   Nicotine  Dependence   Medical Management of Chronic Issues    Patient is present for 3 month follow up smoking cessation. Reports she is still doing one full pack daily      Subjective     HPI     Medical Management of Chronic Issues    Additional comments: Patient is present for 3 month follow up smoking cessation. Reports she is still doing one full pack daily         Last edited by Cherry Chiquita HERO, CMA on 10/29/2023  1:03 PM.       Discussed the use of AI scribe software for clinical note transcription with the patient, who gave verbal consent to proceed.  History of Present Illness Jennifer [name] is a 73 year old female with aortic atherosclerosis and chronic hyperlipidemia who presents for a chronic follow-up.  She has a history of aortic atherosclerosis and chronic hyperlipidemia, managed with atorvastatin  10 mg daily.  She has chronic osteoporosis and continues to take Fosamax  70 mg weekly, along with calcium  and vitamin D  supplements.  She has a history of tobacco use disorder and continues to smoke one pack per day. She has previously tried Wellbutrin and Chantix  for smoking cessation without success. She has nicotine  patches at home but has not used them yet.  She has central lobar emphysema and continues to use Trelegy 100/62.5/25 mcg daily. She also uses an albuterol  inhaler as needed for COPD.  She has an eye infection in the right eye, characterized by constant watering and eyelid crusting upon waking, persisting for over a week. The discharge is thick and clear. No pain or itchiness.  She experiences allergies but does not take any medication for them due to adverse reactions to antihistamines, which cause jitters.     Past  Medical History:  Diagnosis Date   Arthritis    mild   Cancer (HCC)    skin cancer leg and chest - Dr. Jakie, and [facility]   COPD (chronic obstructive pulmonary disease) (HCC)    Dysrhythmia    palpitation   Emphysema of lung (HCC)    Family history of colon cancer    Headache    as  a child    Heart murmur    while pregnant   Hyperlipidemia 07/17/2017   Osteoporosis    Sleep apnea    Tobacco abuse     Medications: Outpatient Medications Prior to Visit  Medication Sig   albuterol  (VENTOLIN  HFA) 108 (90 Base) MCG/ACT inhaler INHALE 2 PUFFS BY MOUTH EVERY 6 HOURS AS NEEDED FOR WHEEZING FOR SHORTNESS OF BREATH   alendronate  (FOSAMAX ) 70 MG tablet TAKE 1 TABLET EVERY WEEK  ON  AN  EMPTY  STOMACH WITH A FULL GLASS OF WATER   atorvastatin  (LIPITOR) 10 MG tablet TAKE 1 TABLET BY MOUTH ONCE DAILY. MUST KEEP APPOINTMENT FOR FURTHER REFILLS.   b complex vitamins capsule Take 1 capsule by mouth daily.   Calcium  Citrate-Vitamin D  315-5 MG-MCG TABS    fluconazole  (DIFLUCAN ) 150 MG tablet Take one 150 mg tablet by mouth at onset of symptoms and repeat after 72 hours if symptoms persist   nicotine  (NICODERM CQ  - DOSED IN MG/24 HOURS) 14 mg/24hr patch Place 1  patch (14 mg total) onto the skin daily.   TRELEGY ELLIPTA  100-62.5-25 MCG/ACT AEPB Inhale 1 puff by mouth once daily   Zinc Sulfate (ZINC 15 PO) Take by mouth.   No facility-administered medications prior to visit.    Review of Systems  Last CBC Lab Results  Component Value Date   WBC 5.7 11/13/2022   HGB 15.0 11/13/2022   HCT 45.0 11/13/2022   MCV 95 11/13/2022   MCH 31.6 11/13/2022   RDW 13.6 11/13/2022   PLT 180 11/13/2022   Last metabolic panel Lab Results  Component Value Date   GLUCOSE 66 (L) 10/29/2023   NA 136 10/29/2023   K 5.0 10/29/2023   CL 96 10/29/2023   CO2 20 10/29/2023   BUN 12 10/29/2023   CREATININE 0.73 10/29/2023   EGFR 88 10/29/2023   CALCIUM  10.1 10/29/2023   PHOS 2.3 (L)  07/18/2017   PROT 7.0 10/29/2023   ALBUMIN 4.6 10/29/2023   LABGLOB 2.4 10/29/2023   AGRATIO 2.2 05/12/2021   BILITOT 1.0 10/29/2023   ALKPHOS 99 10/29/2023   AST 38 10/29/2023   ALT 27 10/29/2023   ANIONGAP 12 08/24/2018   Last lipids Lab Results  Component Value Date   CHOL 231 (H) 10/29/2023   HDL 144 10/29/2023   LDLCALC 73 10/29/2023   TRIG 82 10/29/2023   CHOLHDL 1.6 10/29/2023   The ASCVD Risk score (Arnett DK, et al., 2019) failed to calculate for the following reasons:   The valid HDL cholesterol range is 20 to 100 mg/dL  Last hemoglobin J8r Lab Results  Component Value Date   HGBA1C 5.0 11/13/2022   Last thyroid  functions Lab Results  Component Value Date   TSH 3.000 11/13/2022   Last vitamin D  Lab Results  Component Value Date   VD25OH 34.6 11/13/2022   Last vitamin B12 and Folate No results found for: VITAMINB12, FOLATE      Objective    BP 138/82 (Cuff Size: Normal)   Pulse (!) 101   Temp 98.6 F (37 C) (Oral)   Ht 5' 8.5 (1.74 m)   Wt 122 lb 4.8 oz (55.5 kg)   SpO2 99%   BMI 18.33 kg/m  BP Readings from Last 3 Encounters:  10/29/23 138/82  06/16/23 126/70  05/08/23 (!) 144/75   Wt Readings from Last 3 Encounters:  10/29/23 122 lb 4.8 oz (55.5 kg)  10/27/23 130 lb (59 kg)  06/16/23 133 lb (60.3 kg)       Physical Exam Vitals reviewed.  Constitutional:      General: She is not in acute distress.    Appearance: Normal appearance. She is not ill-appearing.  Cardiovascular:     Rate and Rhythm: Normal rate and regular rhythm.  Pulmonary:     Effort: Pulmonary effort is normal. No respiratory distress.     Breath sounds: No wheezing, rhonchi or rales.  Neurological:     Mental Status: She is alert and oriented to person, place, and time.  Psychiatric:        Mood and Affect: Affect normal. Mood is anxious.        Speech: Speech normal.        Behavior: Behavior normal. Behavior is not hyperactive. Behavior is cooperative.      Results for orders placed or performed in visit on 10/29/23  Lipid panel  Result Value Ref Range   Cholesterol, Total 231 (H) 100 - 199 mg/dL   Triglycerides 82 0 - 149 mg/dL  HDL 144 >39 mg/dL   VLDL Cholesterol Cal 14 5 - 40 mg/dL   LDL Chol Calc (NIH) 73 0 - 99 mg/dL   Chol/HDL Ratio 1.6 0.0 - 4.4 ratio  CMP14+EGFR  Result Value Ref Range   Glucose 66 (L) 70 - 99 mg/dL   BUN 12 8 - 27 mg/dL   Creatinine, Ser 9.26 0.57 - 1.00 mg/dL   eGFR 88 >40 fO/fpw/8.26   BUN/Creatinine Ratio 16 12 - 28   Sodium 136 134 - 144 mmol/L   Potassium 5.0 3.5 - 5.2 mmol/L   Chloride 96 96 - 106 mmol/L   CO2 20 20 - 29 mmol/L   Calcium  10.1 8.7 - 10.3 mg/dL   Total Protein 7.0 6.0 - 8.5 g/dL   Albumin 4.6 3.8 - 4.8 g/dL   Globulin, Total 2.4 1.5 - 4.5 g/dL   Bilirubin Total 1.0 0.0 - 1.2 mg/dL   Alkaline Phosphatase 99 49 - 135 IU/L   AST 38 0 - 40 IU/L   ALT 27 0 - 32 IU/L     Assessment & Plan     Problem List Items Addressed This Visit     Aortic atherosclerosis   Relevant Orders   Lipid panel (Completed)   Centrilobular emphysema (HCC)   Depression, recurrent   History of alcoholism (HCC)   Hyperlipidemia   Relevant Orders   Lipid panel (Completed)   Osteoporosis   Tobacco use disorder   Other Visit Diagnoses       Discharge of right eye    -  Primary   Relevant Medications   trimethoprim-polymyxin b (POLYTRIM) ophthalmic solution     Elevated blood pressure reading       Relevant Orders   CMP14+EGFR (Completed)       Assessment and Plan Assessment & Plan Right Eye Conjunctivitis Acute Right eye discharge with symptoms suggestive of conjunctivitis. Eye discharge is thick and clear, causing eyelid to stick in the morning. - Prescribe Polytrim eye drops, 2 drops in right eye four times daily for 5 days  Nicotine  Dependence (Active Tobacco Use) Chronic  Active tobacco use with a history of nicotine  dependence. Currently smoking one pack per day. Previous  trials of Chantix  and Wellbutrin were unsuccessful. She expresses a desire to quit but cites stress as a barrier. - Recommend trial of nicotine  14mg  24 hr patches - Encourage reduction in cigarette consumption - Encourage use of alternative activities such as walking to reduce smoking - counseled on smoking cessation for 7 mins   Centrilobular Emphysema (COPD) Chronic COPD managed with Trelegy and albuterol  inhaler as needed. - Continue Trelegy 100/62.5/25 mcg daily - Continue albuterol  inhaler as needed -encouraged use of nicotine  patches for smoking cessation   Chronic Aortic Atherosclerosis and Hyperlipidemia Chronic aortic atherosclerosis and hyperlipidemia managed with atorvastatin . - Continue atorvastatin  10 mg daily - Order cholesterol recheck  Elevated Blood Pressure Chronic  Blood pressure slightly elevated at 137/92, with concern for diastolic pressure. Blood pressure improved on recheck to 138/82. - Monitor blood pressure - no recommendation to start antihypertensive agents today   Osteoporosis Chronic osteoporosis managed with Fosamax , calcium , and vitamin D  supplementation. Bone density scan due next year. - Continue Fosamax  70 mg weekly - Continue calcium  and vitamin D  supplementation  Depression Chronic  Flowsheet Row Office Visit from 10/29/2023 in [facility]  PHQ-9 Total Score 4   - no current medications for depression symptoms   Allergic Rhinitis (Seasonal) Chronic Seasonal allergic rhinitis. Unable  to tolerate antihistamines due to side effects. - continue to monitor symptoms   Alcohol Use in Remission Alcohol use disorder in remission. No current alcohol consumption reported. - continue abstinence from alcohol   General Health Maintenance Annual wellness visit completed. Mammogram is past due and scheduled for October 24. Bone density scan due next year. Shingrix vaccine declined. - Schedule mammogram for October 24 -  Document Shingrix vaccine declined     Return in about 4 months (around 02/29/2024) for Chronic F/U, COPD, Tobacco Use.         Rockie Agent, MD  [facility] [phone] (phone) [phone] (fax)  [facility] : [facility]

## 2023-10-29 NOTE — Patient Instructions (Signed)
 [facility] [address] [place],  [place]  [address] Main: [phone]     To keep you healthy, please keep in mind the following health maintenance items that you are due for:   Health Maintenance Due  Topic Date Due   Zoster Vaccines- Shingrix (1 of 2) Never done   DEXA SCAN  03/11/2022   Lung Cancer Screening  04/29/2023   Mammogram  11/19/2023     Best Wishes,   Dr. Lang

## 2023-10-30 LAB — CMP14+EGFR
ALT: 27 IU/L (ref 0–32)
AST: 38 IU/L (ref 0–40)
Albumin: 4.6 g/dL (ref 3.8–4.8)
Alkaline Phosphatase: 99 IU/L (ref 49–135)
BUN/Creatinine Ratio: 16 (ref 12–28)
BUN: 12 mg/dL (ref 8–27)
Bilirubin Total: 1 mg/dL (ref 0.0–1.2)
CO2: 20 mmol/L (ref 20–29)
Calcium: 10.1 mg/dL (ref 8.7–10.3)
Chloride: 96 mmol/L (ref 96–106)
Creatinine, Ser: 0.73 mg/dL (ref 0.57–1.00)
Globulin, Total: 2.4 g/dL (ref 1.5–4.5)
Glucose: 66 mg/dL — ABNORMAL LOW (ref 70–99)
Potassium: 5 mmol/L (ref 3.5–5.2)
Sodium: 136 mmol/L (ref 134–144)
Total Protein: 7 g/dL (ref 6.0–8.5)
eGFR: 88 mL/min/1.73 (ref >59)

## 2023-10-30 LAB — LIPID PANEL
Chol/HDL Ratio: 1.6 ratio (ref 0.0–4.4)
Cholesterol, Total: 231 mg/dL — ABNORMAL HIGH (ref 100–199)
HDL: 144 mg/dL (ref >39)
LDL Chol Calc (NIH): 73 mg/dL (ref 0–99)
Triglycerides: 82 mg/dL (ref 0–149)
VLDL Cholesterol Cal: 14 mg/dL (ref 5–40)

## 2023-11-08 ENCOUNTER — Ambulatory Visit: Payer: Self-pay | Admitting: Family Medicine

## 2023-12-17 ENCOUNTER — Encounter: Payer: Self-pay | Admitting: Pharmacist

## 2023-12-17 NOTE — Progress Notes (Signed)
 This patient is appearing on a report for being at risk of failing the adherence measure for cholesterol (statin) medications this calendar year.   Medication: atorvastatin  10 mg Last fill date: 09/15/23 for 90 day supply. Refill due 12/14/23. Will follow up with patient in the near future.   Completed chart review.

## 2023-12-31 ENCOUNTER — Emergency Department

## 2023-12-31 ENCOUNTER — Emergency Department
Admission: EM | Admit: 2023-12-31 | Discharge: 2023-12-31 | Disposition: A | Discharge status: Home / Self Care | Attending: Emergency Medicine | Admitting: Emergency Medicine

## 2023-12-31 ENCOUNTER — Encounter: Payer: Self-pay | Admitting: Emergency Medicine

## 2023-12-31 ENCOUNTER — Other Ambulatory Visit: Payer: Self-pay

## 2023-12-31 DIAGNOSIS — S92231A Displaced fracture of intermediate cuneiform of right foot, initial encounter for closed fracture: Secondary | ICD-10-CM | POA: Insufficient documentation

## 2023-12-31 DIAGNOSIS — W19XXXA Unspecified fall, initial encounter: Secondary | ICD-10-CM | POA: Insufficient documentation

## 2023-12-31 DIAGNOSIS — S92214A Nondisplaced fracture of cuboid bone of right foot, initial encounter for closed fracture: Secondary | ICD-10-CM | POA: Insufficient documentation

## 2023-12-31 MED ORDER — OXYCODONE HCL 5 MG PO TABS: 5.0000 mg | ORAL_TABLET | Freq: Four times a day (QID) | ORAL | 0 refills | Status: AC | PRN

## 2023-12-31 MED ORDER — HYDROCODONE-ACETAMINOPHEN 5-325 MG PO TABS
1.0000 | ORAL_TABLET | ORAL | Status: AC
  Administered 2023-12-31: 1 via ORAL
  Filled 2023-12-31: qty 1

## 2023-12-31 MED ORDER — OXYCODONE-ACETAMINOPHEN 5-325 MG PO TABS
1.0000 | ORAL_TABLET | Freq: Once | ORAL | Status: AC
  Administered 2023-12-31: 1 via ORAL
  Filled 2023-12-31: qty 1

## 2023-12-31 MED ORDER — HYDROCODONE-ACETAMINOPHEN 5-325 MG PO TABS: 2.0000 | ORAL_TABLET | ORAL | Status: DC

## 2023-12-31 NOTE — ED Notes (Signed)
Walker sent home with patient.

## 2023-12-31 NOTE — ED Triage Notes (Signed)
 Presents via EMS from home  States he right foot gave away this am   Bruising and swelling noted to right foot Unable to bear wt

## 2023-12-31 NOTE — ED Provider Notes (Signed)
 [facility] AT [facility] Provider Note   CSN: [id number] Arrival date & time: 12/31/23  [id number]     Patient presents with: Foot Injury   Jennifer [name] is a 73 y.o. female presents to the emergency department for evaluation of a fall that occurred around 6 PM today.  Patient states she got up this morning and rolled her ankle around 6 AM.  Denies any other injury to her body.  She initially was able to walk and maneuver but as the day went on swelling and pain increased throughout the foot and ankle.  She denies any knee, groin, thigh, shoulder pain.  No head injury LOC nausea or vomiting.  No headache.  She has only taken a muscle relaxer for pain.   HPI     Prior to Admission medications   Medication Sig Start Date End Date Taking? Authorizing Provider  oxyCODONE  (ROXICODONE ) 5 MG immediate release tablet Take 1 tablet (5 mg total) by mouth every 6 (six) hours as needed for severe pain (pain score 7-10). 12/31/23 12/30/24 Yes Charlene Debby BROCKS, PA-C  albuterol  (VENTOLIN  HFA) 108 (90 Base) MCG/ACT inhaler INHALE 2 PUFFS BY MOUTH EVERY 6 HOURS AS NEEDED FOR WHEEZING FOR SHORTNESS OF BREATH 08/19/22   Emilio Marseille T, FNP  alendronate  (FOSAMAX ) 70 MG tablet TAKE 1 TABLET EVERY WEEK  ON  AN  EMPTY  STOMACH WITH A FULL GLASS OF WATER 12/06/20   Emilio Marseille T, FNP  atorvastatin  (LIPITOR) 10 MG tablet TAKE 1 TABLET BY MOUTH ONCE DAILY. MUST KEEP APPOINTMENT FOR FURTHER REFILLS. 09/13/23   Simmons-Robinson, Rockie, MD  b complex vitamins capsule Take 1 capsule by mouth daily.    [provider]  Calcium  Citrate-Vitamin D  315-5 MG-MCG TABS  05/21/21   [provider]  fluconazole  (DIFLUCAN ) 150 MG tablet Take one 150 mg tablet by mouth at onset of symptoms and repeat after 72 hours if symptoms persist 06/16/23   Simmons-Robinson, Rockie, MD  TRELEGY ELLIPTA  100-62.5-25 MCG/ACT AEPB Inhale 1 puff by mouth once daily 01/21/23   Emilio Marseille T, FNP  Zinc  Sulfate (ZINC 15 PO) Take by mouth.    [provider]    Allergies: Patient has no known allergies.    Review of Systems  Updated Vital Signs BP 134/84 (BP Location: Right Arm)   Pulse 100   Temp 98.4 F (36.9 C) (Oral)   Resp 20   Ht 5' 8 (1.727 m)   Wt 56.2 kg   SpO2 95%   BMI 18.85 kg/m   Physical Exam Constitutional:      Appearance: She is well-developed.  HENT:     Head: Normocephalic and atraumatic.  Eyes:     Conjunctiva/sclera: Conjunctivae normal.  Cardiovascular:     Rate and Rhythm: Normal rate.  Pulmonary:     Effort: Pulmonary effort is normal. No respiratory distress.  Musculoskeletal:        General: Normal range of motion.     Cervical back: Normal range of motion.     Comments: Diffuse swelling, ecchymosis throughout the right ankle and right foot.  2+ dorsalis pedis pulses are present.  Sensation is intact distally.  No skin breakdown noted.  Superficial blister along the dorsum of the metatarsal region of the foot.  She is tender throughout the ankle and top of the foot.  Skin:    General: Skin is warm.     Findings: No rash.  Neurological:     General:  No focal deficit present.     Mental Status: She is alert and oriented to person, place, and time.     Cranial Nerves: No cranial nerve deficit.     Motor: No weakness.  Psychiatric:        Mood and Affect: Mood normal.        Behavior: Behavior normal.        Thought Content: Thought content normal.     (all labs ordered are listed, but only abnormal results are displayed) Labs Reviewed - No data to display  EKG: None  Radiology: DG Foot Complete Right Result Date: 12/31/2023 CLINICAL DATA:  Right foot and ankle pain, bruising and swelling EXAM: RIGHT FOOT COMPLETE - 3+ VIEW; RIGHT ANKLE - COMPLETE 3+ VIEW COMPARISON:  None Available. FINDINGS: Right ankle: Frontal, oblique, and lateral views of the right ankle are obtained. No acute fracture, subluxation, or dislocation. Joint  spaces are well preserved. Diffuse soft tissue swelling, greatest anteriorly and laterally. Right foot: Frontal, oblique, and lateral views are obtained. There is an acute avulsion fracture along the lateral aspect of the cuboid, which involves both the calcaneocuboid and fifth tarsometatarsal joint spaces. Additionally, there may also be a small avulsion fracture along the dorsal distal margin of the medial cuneiform, least seen on the oblique projection. Joint spaces are well preserved. There is marked dorsal soft tissue swelling throughout the foot. IMPRESSION: 1. Acute avulsion fracture along the lateral margin of the cuboid, which extends from the calcaneocuboid joint space into the fifth tarsometatarsal joint space. 2. Small avulsion fracture along the dorsal distal margin of the medial cuneiform. 3. Diffuse soft tissue swelling throughout the foot and ankle. Electronically Signed   By: Ozell Daring M.D.   On: 12/31/2023 18:44   DG Ankle Complete Right Result Date: 12/31/2023 CLINICAL DATA:  Right foot and ankle pain, bruising and swelling EXAM: RIGHT FOOT COMPLETE - 3+ VIEW; RIGHT ANKLE - COMPLETE 3+ VIEW COMPARISON:  None Available. FINDINGS: Right ankle: Frontal, oblique, and lateral views of the right ankle are obtained. No acute fracture, subluxation, or dislocation. Joint spaces are well preserved. Diffuse soft tissue swelling, greatest anteriorly and laterally. Right foot: Frontal, oblique, and lateral views are obtained. There is an acute avulsion fracture along the lateral aspect of the cuboid, which involves both the calcaneocuboid and fifth tarsometatarsal joint spaces. Additionally, there may also be a small avulsion fracture along the dorsal distal margin of the medial cuneiform, least seen on the oblique projection. Joint spaces are well preserved. There is marked dorsal soft tissue swelling throughout the foot. IMPRESSION: 1. Acute avulsion fracture along the lateral margin of the cuboid,  which extends from the calcaneocuboid joint space into the fifth tarsometatarsal joint space. 2. Small avulsion fracture along the dorsal distal margin of the medial cuneiform. 3. Diffuse soft tissue swelling throughout the foot and ankle. Electronically Signed   By: Ozell Daring M.D.   On: 12/31/2023 18:44     Procedures   Medications Ordered in the ED  HYDROcodone -acetaminophen  (NORCO/VICODIN) 5-325 MG per tablet 1 tablet (1 tablet Oral Given 12/31/23 1754)  oxyCODONE -acetaminophen  (PERCOCET/ROXICET) 5-325 MG per tablet 1 tablet (1 tablet Oral Given 12/31/23 1853)                                    Medical Decision Making Amount and/or Complexity of Data Reviewed Radiology: ordered.  Risk Prescription drug management.  73 year old female with right foot pain, swelling from a injury that occurred around 6 AM today.  No other injury to her body.  She has swelling on the dorsum of the foot.  Ankle x-rays are negative.  Foot x-ray showed avulsion fracture of the cuboid and cuneiform bones of the right foot.  She is placed into a walking boot, will be giving nonweightbearing restrictions on the right lower extremity.  She is given a walker to help with ambulation and she is given oxycodone  for pain.  She is educated on resting icing elevating the right lower extremity.  She will call podiatry office on Monday to schedule follow-up appointment.  Patient is currently neurovascular intact in right lower extremity.  Small fracture blisters along the dorsum of the foot are wrapped with Vaseline gauze and then Ace wrap to help with compression.  She has been placed into a boot and given walker.  She will keep dressing clean dry and intact.  Final diagnoses:  Closed nondisplaced fracture of cuboid of right foot, initial encounter  Closed displaced fracture of intermediate cuneiform of right foot, initial encounter    ED Discharge Orders          Ordered    oxyCODONE  (ROXICODONE ) 5 MG  immediate release tablet  Every 6 hours PRN        12/31/23 1928               Shalon Councilman C, PA-C 12/31/23 1934    Bradler, Evan K, MD 12/31/23 2258

## 2023-12-31 NOTE — Discharge Instructions (Signed)
 Please use walker to help with ambulation.  When walking do not place any weight on your right lower extremity.  Use walking boot for protection when ambulating.  Call podiatry office Monday of next week to schedule follow-up appointment.  Keep the right foot elevated above your heart is much as possible.  Return to the ER for any severe increasing pain worsening symptoms or any urgent changes in your health.

## 2024-01-06 ENCOUNTER — Emergency Department

## 2024-01-06 ENCOUNTER — Emergency Department
Admission: EM | Admit: 2024-01-06 | Discharge: 2024-01-06 | Disposition: A | Discharge status: Home / Self Care | Attending: Emergency Medicine | Admitting: Emergency Medicine

## 2024-01-06 ENCOUNTER — Other Ambulatory Visit: Payer: Self-pay

## 2024-01-06 ENCOUNTER — Other Ambulatory Visit: Payer: Self-pay | Admitting: Family Medicine

## 2024-01-06 DIAGNOSIS — F17200 Nicotine dependence, unspecified, uncomplicated: Secondary | ICD-10-CM | POA: Diagnosis not present

## 2024-01-06 DIAGNOSIS — X58XXXA Exposure to other specified factors, initial encounter: Secondary | ICD-10-CM | POA: Diagnosis not present

## 2024-01-06 DIAGNOSIS — S8011XA Contusion of right lower leg, initial encounter: Secondary | ICD-10-CM | POA: Insufficient documentation

## 2024-01-06 DIAGNOSIS — S8991XA Unspecified injury of right lower leg, initial encounter: Secondary | ICD-10-CM | POA: Diagnosis present

## 2024-01-06 DIAGNOSIS — M79604 Pain in right leg: Secondary | ICD-10-CM | POA: Diagnosis not present

## 2024-01-06 DIAGNOSIS — E7800 Pure hypercholesterolemia, unspecified: Secondary | ICD-10-CM

## 2024-01-06 LAB — CBC
HCT: 33.6 % — ABNORMAL LOW (ref 36.0–46.0)
Hemoglobin: 11.3 g/dL — ABNORMAL LOW (ref 12.0–15.0)
MCH: 33.5 pg (ref 26.0–34.0)
MCHC: 33.6 g/dL (ref 30.0–36.0)
MCV: 99.7 fL (ref 80.0–100.0)
Platelets: 138 K/uL — ABNORMAL LOW (ref 150–400)
RBC: 3.37 MIL/uL — ABNORMAL LOW (ref 3.87–5.11)
RDW: 13.9 % (ref 11.5–15.5)
WBC: 6.4 K/uL (ref 4.0–10.5)
nRBC: 0 % (ref 0.0–0.2)

## 2024-01-06 LAB — BASIC METABOLIC PANEL WITH GFR
Anion gap: 11 (ref 5–15)
BUN: 9 mg/dL (ref 8–23)
CO2: 26 mmol/L (ref 22–32)
Calcium: 9.2 mg/dL (ref 8.9–10.3)
Chloride: 101 mmol/L (ref 98–111)
Creatinine, Ser: 0.65 mg/dL (ref 0.44–1.00)
GFR, Estimated: >60 mL/min (ref >60)
Glucose, Bld: 88 mg/dL (ref 70–99)
Potassium: 3.4 mmol/L — ABNORMAL LOW (ref 3.5–5.1)
Sodium: 138 mmol/L (ref 135–145)

## 2024-01-06 NOTE — ED Provider Notes (Signed)
 [facility] Provider Note   Event Date/Time   First MD Initiated Contact with Patient 01/06/24 2103     (approximate) History  Leg Pain  HPI Jennifer [name] is a 73 y.o. female with a past medical history of headaches, tobacco abuse, hyperlipidemia, and emphysema who presents complaining of worsening right leg pain after being diagnosed with cuboid fracture 2 days prior to arrival.  Patient has been in a walking boot since that time and states that when she woke up this morning the entire leg that was in the boot was bruised with worsening pain.  Patient does state this leg pain is worse with ambulation ROS: Patient currently denies any vision changes, tinnitus, difficulty speaking, facial droop, sore throat, chest pain, shortness of breath, abdominal pain, nausea/vomiting/diarrhea, dysuria, or weakness/numbness/paresthesias in any extremity   Physical Exam  Triage Vital Signs: ED Triage Vitals  Encounter Vitals Group     BP 01/06/24 1835 122/72     Girls Systolic BP Percentile --      Girls Diastolic BP Percentile --      Boys Systolic BP Percentile --      Boys Diastolic BP Percentile --      Pulse Rate 01/06/24 1835 77     Resp 01/06/24 1835 18     Temp 01/06/24 1835 98.1 F (36.7 C)     Temp Source 01/06/24 1835 Oral     SpO2 01/06/24 1835 100 %     Weight 01/06/24 1836 127 lb (57.6 kg)     Height 01/06/24 1836 5' 8 (1.727 m)     Head Circumference --      Peak Flow --      Pain Score 01/06/24 1835 7     Pain Loc --      Pain Education --      Exclude from Growth Chart --    Most recent vital signs: Vitals:   01/06/24 1835  BP: 122/72  Pulse: 77  Resp: 18  Temp: 98.1 F (36.7 C)  SpO2: 100%   General: Awake, oriented x4. CV:  Good peripheral perfusion. Resp:  Normal effort. Abd:  No distention. Other:  Elderly well-developed, well-nourished Caucasian female resting comfortably in no acute distress.  Edema, erythema, ecchymosis from the  right knee down to the foot with tenderness to palpation and +2 pedal pulses ED Results / Procedures / Treatments  Labs (all labs ordered are listed, but only abnormal results are displayed) Labs Reviewed  CBC - Abnormal; Notable for the following components:      Result Value   RBC 3.37 (*)    Hemoglobin 11.3 (*)    HCT 33.6 (*)    Platelets 138 (*)    All other components within normal limits  BASIC METABOLIC PANEL WITH GFR - Abnormal; Notable for the following components:   Potassium 3.4 (*)    All other components within normal limits   EKG ED ECG REPORT I, Artist MARLA Kerns, the attending physician, personally viewed and interpreted this ECG. Date: 01/06/2024 EKG Time: 1839 Rate: 67 Rhythm: normal sinus rhythm QRS Axis: normal Intervals: normal ST/T Wave abnormalities: normal Narrative Interpretation: no evidence of acute ischemia RADIOLOGY ED MD interpretation: Doppler ultrasound of the right lower extremity shows no evidence of DVT - All radiology independently interpreted and agree with radiology assessment Official radiology report(s): US  Venous Img Lower Unilateral Right Result Date: 01/06/2024 EXAM: ULTRASOUND DUPLEX OF THE RIGHT LOWER EXTREMITY VEINS TECHNIQUE: Duplex ultrasound using  B-mode/gray scaled imaging and Doppler spectral analysis and color flow was obtained of the deep venous structures of the right lower extremity. COMPARISON: None available. CLINICAL HISTORY: pain FINDINGS: The common femoral vein, femoral vein, popliteal vein, and posterior tibial vein demonstrate normal compressibility with normal color flow and spectral analysis. IMPRESSION: 1. No evidence of DVT in the right lower extremity. Electronically signed by: Franky Crease MD 01/06/2024 10:27 PM EST RP Workstation: [id number]   PROCEDURES: Critical Care performed: No Procedures MEDICATIONS ORDERED IN ED: Medications - No data to display IMPRESSION / MDM / ASSESSMENT AND PLAN / ED COURSE  I  reviewed the triage vital signs and the nursing notes.                             The patient is on the cardiac monitor to evaluate for evidence of arrhythmia and/or significant heart rate changes. Patient's presentation is most consistent with acute presentation with potential threat to life or bodily function. Patient 73 year old female with the above-stated past medical history who presents complaining of worsening pain in the right lower extremity with worsening ecchymosis up the right leg DDx: DVT, spreading ecchymosis, calf contusion, gastrocnemius tear Plan: CBC, BMP, EKG, Doppler ultrasound of the right lower extremity  Radiologic and laboratory evaluation does not show any evidence of acute abnormalities at this time.  Patient was encouraged to continue her walking boot as well as take breaks as necessary when she is not ambulatory.  Patient also requested a postop shoe to keep her foot relatively immobilized never she does not want to wear the boot.  This was provided.  Patient already has oxycodone  for pain control.  Patient was encouraged to follow-up with orthopedics as instructed.  Patient given strict return precautions and all questions answered prior to discharge  Dispo: Discharge home with orthopedic and PCP follow-up as needed   FINAL CLINICAL IMPRESSION(S) / ED DIAGNOSES   Final diagnoses:  Right leg pain  Traumatic ecchymosis of right lower leg, initial encounter   Rx / DC Orders   ED Discharge Orders     None      Note:  This document was prepared using Dragon voice recognition software and may include unintentional dictation errors.   Vanita Cannell K, MD 01/06/24 [phone]

## 2024-01-06 NOTE — ED Triage Notes (Signed)
 Pt BIB [facility] from home due to new onset of leg pain and discoloration. Pt states she sprained leg on Tuesday. Pt states this morning she experienced a new increase in leg pain with ambulation and states she feels weird.  Denies N/V or fevers. Pulse is palpable in R leg.

## 2024-01-31 ENCOUNTER — Telehealth: Payer: Self-pay

## 2024-01-31 NOTE — Telephone Encounter (Signed)
 Copied from CRM [id number]. Topic: Clinical - Medical Advice >> Jan 31, 2024 12:17 PM Jasmin G wrote: Reason for CRM: Pt requested a call back at [phone] from Dr. Marcine team due to experiencing a lot of chest congestion.

## 2024-02-02 ENCOUNTER — Ambulatory Visit: Admitting: Family Medicine

## 2024-02-02 NOTE — Telephone Encounter (Signed)
 Patient call note and symptoms reviewed. Agree with scheduled appt. Will evaluate during OV

## 2024-02-17 ENCOUNTER — Other Ambulatory Visit: Payer: Self-pay

## 2024-02-17 ENCOUNTER — Emergency Department
Admission: EM | Admit: 2024-02-17 | Discharge: 2024-02-17 | Disposition: A | Discharge status: Home / Self Care | Attending: Emergency Medicine | Admitting: Emergency Medicine

## 2024-02-17 ENCOUNTER — Emergency Department

## 2024-02-17 DIAGNOSIS — S6991XA Unspecified injury of right wrist, hand and finger(s), initial encounter: Secondary | ICD-10-CM | POA: Diagnosis present

## 2024-02-17 DIAGNOSIS — X58XXXA Exposure to other specified factors, initial encounter: Secondary | ICD-10-CM | POA: Diagnosis not present

## 2024-02-17 DIAGNOSIS — S52501A Unspecified fracture of the lower end of right radius, initial encounter for closed fracture: Secondary | ICD-10-CM | POA: Diagnosis not present

## 2024-02-17 DIAGNOSIS — M25531 Pain in right wrist: Secondary | ICD-10-CM | POA: Diagnosis present

## 2024-02-17 MED ORDER — HYDROCODONE-ACETAMINOPHEN 5-325 MG PO TABS: 1.0000 | ORAL_TABLET | ORAL | 0 refills | Status: AC | PRN

## 2024-02-17 NOTE — Discharge Instructions (Addendum)
 Your evaluated in the ED for wrist pain.  Your x-ray shows a nondisplaced fracture of the distal radial metaphysis and mild diffuse soft tissue swelling.  We have placed you in a splint with a sling for comfort.  You will need to remain in the splint until you are able to follow-up with orthopedics.  Call and schedule an appointment with Dr. Edie office for further management.

## 2024-02-17 NOTE — ED Provider Notes (Signed)
 "   [facility] Emergency Department Provider Note     Event Date/Time   First MD Initiated Contact with Patient 02/17/24 1910     (approximate)   History   Wrist Pain   HPI  Jennifer [name] is a 74 y.o. female with a past medical history of osteoporosis presents to the ED for evaluation of right wrist pain.  Unknown injury or trauma.  Denies fall.  Patient reports noted swelling and pain with range of motion.     Physical Exam   Triage Vital Signs: ED Triage Vitals  Encounter Vitals Group     BP 02/17/24 1908 (!) 111/59     Girls Systolic BP Percentile --      Girls Diastolic BP Percentile --      Boys Systolic BP Percentile --      Boys Diastolic BP Percentile --      Pulse Rate 02/17/24 1908 81     Resp 02/17/24 1908 18     Temp 02/17/24 1908 98.8 F (37.1 C)     Temp Source 02/17/24 1908 Oral     SpO2 02/17/24 1908 95 %     Weight 02/17/24 1907 150 lb (68 kg)     Height 02/17/24 1907 5' 10 (1.778 m)     Head Circumference --      Peak Flow --      Pain Score 02/17/24 1907 10     Pain Loc --      Pain Education --      Exclude from Growth Chart --     Most recent vital signs: Vitals:   02/17/24 1908  BP: (!) 111/59  Pulse: 81  Resp: 18  Temp: 98.8 F (37.1 C)  SpO2: 95%    General Awake, no distress.  HEENT NCAT.  CV:  Good peripheral perfusion.  RESP:  Normal effort.  ABD:  No distention.  Other:  Right wrist shows swelling over distal radial aspect.  Tenderness to palpation over distal radius and ulna region.  Patient is able to ball her hand and fist but reports pain.  Neurovascular status intact all throughout.   ED Results / Procedures / Treatments   Labs (all labs ordered are listed, but only abnormal results are displayed) Labs Reviewed - No data to display  RADIOLOGY  I personally viewed and evaluated these images as part of my medical decision making, as well as reviewing the written report by the  radiologist.  DG Wrist Complete Right Result Date: 02/17/2024 EXAM: 3 OR MORE VIEW(S) XRAY OF THE RIGHT WRIST 02/17/2024 07:32:38 PM COMPARISON: None available. CLINICAL HISTORY: Wrist pain. FINDINGS: BONES AND JOINTS: Nondisplaced fracture of the distal radial metaphysis. No malalignment. SOFT TISSUES: Mild diffuse soft tissue swelling. IMPRESSION: 1. Nondisplaced fracture of the distal radial metaphysis. 2. Mild diffuse soft tissue swelling. Electronically signed by: Greig Pique MD 02/17/2024 07:44 PM EST RP Workstation: [id number]    PROCEDURES:  Critical Care performed: No  Procedures   MEDICATIONS ORDERED IN ED: Medications - No data to display   IMPRESSION / MDM / ASSESSMENT AND PLAN / ED COURSE  I reviewed the triage vital signs and the nursing notes.                               74 y.o. female presents to the emergency department for evaluation and treatment of acute right nontraumatic wrist pain. See HPI for  further details.   Differential diagnosis includes, but is not limited to fracture dislocation, sprain  Patient's presentation is most consistent with acute complicated illness / injury requiring diagnostic workup.  Patient is alert and oriented.  She is hemodynamically stable.  Physical exam findings are stated above.  X-ray confirms a nondisplaced fracture of the distal radial metaphysis with mild diffuse soft tissue swelling.  Patient was offered pain medication however declined.  We will place her in a sugar-tong splint and provide a sling with comfort.  She is advised to follow-up with orthopedics for further management.  She verbalized understanding.  Short course of Norco sent to pharmacy.  Patient stable condition for discharge home.  ED return precaution discussed.  FINAL CLINICAL IMPRESSION(S) / ED DIAGNOSES   Final diagnoses:  Closed fracture of distal end of right radius, unspecified fracture morphology, initial encounter     Rx / DC Orders   ED  Discharge Orders          Ordered    HYDROcodone -acetaminophen  (NORCO/VICODIN) 5-325 MG tablet  Every 4 hours PRN        02/17/24 2027             Note:  This document was prepared using Dragon voice recognition software and may include unintentional dictation errors.    Margrette, Alaja Goldinger A, PA-C 02/17/24 2143    Viviann Pastor, MD 02/17/24 2242  "

## 2024-02-17 NOTE — ED Triage Notes (Signed)
 Pt ambulatory to triage. Reports R wrist pain without trauma or injury. Pt adamant that it is broken and that she woke up that way in the middle of the night. Pt speaking in full sentences and breathing with symmetric chest rise and fall. Pt argumentative with assessment questions in triage.

## 2024-02-17 NOTE — ED Notes (Signed)
 Assisted Sydney the RN with applying splint on pt.

## 2024-02-29 ENCOUNTER — Ambulatory Visit: Admitting: Family Medicine

## 2024-03-17 ENCOUNTER — Ambulatory Visit: Admitting: Family Medicine

## 2024-11-01 ENCOUNTER — Ambulatory Visit
